# Patient Record
Sex: Female | Born: 1995 | Race: White | Hispanic: No | Marital: Single | Smoking: Never smoker
Health system: Western US, Academic
[De-identification: ages and names within clinical notes are randomized; demographics above are authoritative.]

## PROBLEM LIST (undated history)

## (undated) DIAGNOSIS — G40909 Epilepsy, unspecified, not intractable, without status epilepticus: Secondary | ICD-10-CM

## (undated) DIAGNOSIS — E2839 Other primary ovarian failure: Secondary | ICD-10-CM

## (undated) DIAGNOSIS — D89813 Graft-versus-host disease, unspecified: Secondary | ICD-10-CM

## (undated) DIAGNOSIS — J45909 Unspecified asthma, uncomplicated: Secondary | ICD-10-CM

## (undated) DIAGNOSIS — E46 Unspecified protein-calorie malnutrition: Secondary | ICD-10-CM

## (undated) DIAGNOSIS — R000 Tachycardia, unspecified: Secondary | ICD-10-CM

## (undated) DIAGNOSIS — F4310 Post-traumatic stress disorder, unspecified: Secondary | ICD-10-CM

## (undated) DIAGNOSIS — I10 Essential (primary) hypertension: Secondary | ICD-10-CM

## (undated) DIAGNOSIS — I498 Other specified cardiac arrhythmias: Secondary | ICD-10-CM

## (undated) DIAGNOSIS — M810 Age-related osteoporosis without current pathological fracture: Secondary | ICD-10-CM

## (undated) DIAGNOSIS — D6101 Constitutional (pure) red blood cell aplasia: Secondary | ICD-10-CM

## (undated) DIAGNOSIS — I951 Orthostatic hypotension: Secondary | ICD-10-CM

## (undated) DIAGNOSIS — H902 Conductive hearing loss, unspecified: Secondary | ICD-10-CM

## (undated) DIAGNOSIS — K551 Chronic vascular disorders of intestine: Secondary | ICD-10-CM

## (undated) DIAGNOSIS — G8929 Other chronic pain: Secondary | ICD-10-CM

## (undated) DIAGNOSIS — K3184 Gastroparesis: Secondary | ICD-10-CM

## (undated) DIAGNOSIS — G129 Spinal muscular atrophy, unspecified: Secondary | ICD-10-CM

## (undated) DIAGNOSIS — G90A Postural orthostatic tachycardia syndrome (POTS): Secondary | ICD-10-CM

## (undated) DIAGNOSIS — F445 Conversion disorder with seizures or convulsions: Secondary | ICD-10-CM

## (undated) HISTORY — PX: JEJUNOSTOMY TUBE: PCOTUB00028

## (undated) HISTORY — PX: SP PERC PLACE GASTRIC TUBE: HXRAD333

---
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
## 2017-04-11 ENCOUNTER — Inpatient Hospital Stay
Admission: EM | Admit: 2017-04-11 | Discharge: 2017-04-13 | DRG: 053 | Disposition: A | Discharge status: Other Acute Inpatient Hospital | Attending: Internal Medicine | Admitting: Internal Medicine

## 2017-04-11 ENCOUNTER — Emergency Department

## 2017-04-11 DIAGNOSIS — Z886 Allergy status to analgesic agent status: Secondary | ICD-10-CM

## 2017-04-11 DIAGNOSIS — I10 Essential (primary) hypertension: Secondary | ICD-10-CM

## 2017-04-11 DIAGNOSIS — F4310 Post-traumatic stress disorder, unspecified: Secondary | ICD-10-CM

## 2017-04-11 DIAGNOSIS — Z888 Allergy status to other drugs, medicaments and biological substances status: Secondary | ICD-10-CM

## 2017-04-11 DIAGNOSIS — Z7951 Long term (current) use of inhaled steroids: Secondary | ICD-10-CM

## 2017-04-11 DIAGNOSIS — Z87440 Personal history of urinary (tract) infections: Secondary | ICD-10-CM

## 2017-04-11 DIAGNOSIS — Z880 Allergy status to penicillin: Secondary | ICD-10-CM

## 2017-04-11 DIAGNOSIS — G40901 Epilepsy, unspecified, not intractable, with status epilepticus: Principal | ICD-10-CM

## 2017-04-11 DIAGNOSIS — E2740 Unspecified adrenocortical insufficiency: Secondary | ICD-10-CM

## 2017-04-11 DIAGNOSIS — D89813 Graft-versus-host disease, unspecified: Secondary | ICD-10-CM

## 2017-04-11 DIAGNOSIS — D6101 Constitutional (pure) red blood cell aplasia: Secondary | ICD-10-CM

## 2017-04-11 DIAGNOSIS — Z8489 Family history of other specified conditions: Secondary | ICD-10-CM

## 2017-04-11 DIAGNOSIS — Z681 Body mass index (BMI) 19 or less, adult: Secondary | ICD-10-CM

## 2017-04-11 DIAGNOSIS — G894 Chronic pain syndrome: Secondary | ICD-10-CM

## 2017-04-11 DIAGNOSIS — Z978 Presence of other specified devices: Secondary | ICD-10-CM

## 2017-04-11 DIAGNOSIS — J45909 Unspecified asthma, uncomplicated: Secondary | ICD-10-CM

## 2017-04-11 DIAGNOSIS — K3184 Gastroparesis: Secondary | ICD-10-CM

## 2017-04-11 DIAGNOSIS — N390 Urinary tract infection, site not specified: Secondary | ICD-10-CM

## 2017-04-11 DIAGNOSIS — Z91018 Allergy to other foods: Secondary | ICD-10-CM

## 2017-04-11 DIAGNOSIS — Z881 Allergy status to other antibiotic agents status: Secondary | ICD-10-CM

## 2017-04-11 DIAGNOSIS — M810 Age-related osteoporosis without current pathological fracture: Secondary | ICD-10-CM

## 2017-04-11 DIAGNOSIS — K551 Chronic vascular disorders of intestine: Secondary | ICD-10-CM

## 2017-04-11 DIAGNOSIS — F418 Other specified anxiety disorders: Secondary | ICD-10-CM

## 2017-04-11 DIAGNOSIS — Z79899 Other long term (current) drug therapy: Secondary | ICD-10-CM

## 2017-04-11 DIAGNOSIS — Z91013 Allergy to seafood: Secondary | ICD-10-CM

## 2017-04-11 DIAGNOSIS — Z9481 Bone marrow transplant status: Secondary | ICD-10-CM

## 2017-04-11 DIAGNOSIS — K861 Other chronic pancreatitis: Secondary | ICD-10-CM

## 2017-04-11 DIAGNOSIS — E2839 Other primary ovarian failure: Secondary | ICD-10-CM

## 2017-04-11 DIAGNOSIS — G40909 Epilepsy, unspecified, not intractable, without status epilepticus: Secondary | ICD-10-CM

## 2017-04-11 DIAGNOSIS — H918X3 Other specified hearing loss, bilateral: Secondary | ICD-10-CM

## 2017-04-11 DIAGNOSIS — E43 Unspecified severe protein-calorie malnutrition: Secondary | ICD-10-CM

## 2017-04-11 DIAGNOSIS — K219 Gastro-esophageal reflux disease without esophagitis: Secondary | ICD-10-CM

## 2017-04-11 DIAGNOSIS — I498 Other specified cardiac arrhythmias: Secondary | ICD-10-CM

## 2017-04-11 HISTORY — DX: Constitutional (pure) red blood cell aplasia: D61.01

## 2017-04-11 HISTORY — DX: Epilepsy, unspecified, not intractable, without status epilepticus: G40.909

## 2017-04-11 HISTORY — DX: Essential (primary) hypertension: I10

## 2017-04-11 HISTORY — DX: Unspecified asthma, uncomplicated: J45.909

## 2017-04-11 LAB — UR DRUGS OF ABUSE SCREEN
AMPHETAMINE SCREEN, URINE_MMC: NEGATIVE
BARBITURATES SCREEN, URINE: NEGATIVE
BENZODIAZEPINES SCREEN, URINE: POSITIVE — AB
CANNABINOID, URINE MMC: POSITIVE — AB
COCAINE METABOLITE SCRN, URINE: NEGATIVE
OPIATES SCREEN, URINE: NEGATIVE
PCP, URINE MMC: NEGATIVE
TRICYCLICS, URINE MMC: NEGATIVE

## 2017-04-11 LAB — COMPREHENSIVE METABOLIC PANEL
ALANINE TRANSFERASE (ALT): 19 U/L (ref 4–56)
ALB/GLOB RATIO_MMC: 1.1 (ref 1.0–1.6)
ALBUMIN: 3 g/dL — AB (ref 3.2–4.7)
ALKALINE PHOSPHATASE (ALP): 93 U/L (ref 38–126)
ASPARTATE TRANSAMINASE (AST): 15 U/L (ref 9–44)
BILIRUBIN TOTAL: 0.3 mg/dL (ref 0.1–2.2)
BUN/ Creatinine: 10.1 (ref 7.3–21.7)
CALCIUM: 8.7 mg/dL (ref 8.7–10.2)
CARBON DIOXIDE TOTAL: 24 mmol/L (ref 22–32)
CHLORIDE: 106 mmol/L (ref 99–109)
CREATININE BLOOD: 0.79 mg/dL (ref 0.50–1.30)
E-GFR, NON-AFRICAN AMERICAN: 94 mL/min/{1.73_m2} (ref 60–?)
E-GFR_MMC: 94 mL/min/{1.73_m2} (ref 60–?)
GLOBULIN BLOOD_MMC: 2.7 g/dL (ref 2.2–4.2)
GLUCOSE: 90 mg/dL (ref 70–99)
PROTEIN: 5.7 g/dL — AB (ref 5.9–8.2)
Potassium: 3.6 mmol/L (ref 3.5–5.2)
SODIUM: 138 mmol/L (ref 134–143)
UREA NITROGEN, BLOOD (BUN): 8 mg/dL (ref 6–21)

## 2017-04-11 LAB — URINALYSIS-COMPLETE
BILIRUBIN URINE: NEGATIVE
GLUCOSE URINE: NEGATIVE mg/dL
KETONES_MMC: NEGATIVE
NITRITE URINE: NEGATIVE
OCCULT BLOOD URINE_MMC: NEGATIVE
PH URINE: 7 (ref 5.0–9.0)
PROTEIN URINE: NEGATIVE mg/dL
RBC: 1 /[HPF] (ref <=3)
SPECIFIC GRAVITY URINE MMC: 1.015 (ref 1.002–1.030)
SQUAMOUS EPI: 5
UROBILINOGEN.: NEGATIVE mg/dL
WBC: 14 /HPF — AB (ref <5)

## 2017-04-11 LAB — CBC WITH DIFFERENTIAL
BASOPHILS ABS AUTO: 0 10*3/uL (ref 0.0–0.1)
Basophils % Auto: 0.4 % (ref 0.0–1.0)
EOSINOPHIL % AUTO: 2.8 % (ref 0.0–4.0)
Eosinophils Abs Auto: 0.2 10*3/uL (ref 0.0–0.2)
HEMATOCRIT: 30.3 % — AB (ref 36.0–48.0)
HEMOGLOBIN: 10.6 g/dL — AB (ref 12.0–16.0)
IMMATURE GRANULOCYTES % AUTO: 0.6 % — AB (ref 0.00–0.50)
IMMATURE GRANULOCYTES ABS AUTO: 0.1 10*3/uL — AB (ref 0.0–0.0)
LYMPHOCYTES % AUTO: 24.5 % (ref 5.0–41.0)
Lymphocytes Abs Auto: 2 10*3/uL (ref 1.3–2.9)
MCH: 32.1 pg (ref 27.0–34.0)
MCHC G/DL: 35 g/dL (ref 33.0–37.0)
MCV: 91.8 fL (ref 82.0–97.0)
MONOCYTES % AUTO: 5.3 % (ref 0.0–10.0)
MPV: 9.5 fL (ref 9.4–12.4)
Monocytes Abs Auto: 0.4 10*3/uL (ref 0.3–0.8)
NEUTROPHIL ABS AUTO: 5.39 10*3/uL — AB (ref 2.20–4.80)
NEUTROPHILS % AUTO: 66.4 % (ref 45.0–75.0)
NUCLEATED CELL COUNT: 0 10*3/uL (ref 0.0–0.1)
NUCLEATED RBC/100 WBC: 0 %{WBCs} (ref <=0.0)
PLATELET COUNT: 319 10*3/uL (ref 151–365)
RDW: 13.1 % (ref 11.5–14.5)
RED CELL COUNT: 3.3 10*6/uL — AB (ref 3.80–5.10)
WHITE BLOOD CELL COUNT: 8.1 10*3/uL (ref 4.2–10.8)

## 2017-04-11 LAB — BETA HCG QUANT, PREGNANCY

## 2017-04-11 MED ORDER — ONDANSETRON HCL (PF) 4 MG/2 ML INJECTION SOLUTION
4.0000 mg | Freq: Once | INTRAMUSCULAR | Status: AC
Start: 2017-04-11 — End: 2017-04-11
  Administered 2017-04-11: 4 mg via INTRAVENOUS
  Filled 2017-04-11: qty 2

## 2017-04-11 MED ORDER — NACL 0.9% IV BOLUS - DURATION REQ
1000.0000 mL | Freq: Once | INTRAVENOUS | Status: AC
Start: 2017-04-11 — End: 2017-04-12
  Administered 2017-04-11: 1000 mL via INTRAVENOUS

## 2017-04-11 MED ORDER — LEVETIRACETAM 1,000 MG/100 ML IN SODIUM CHLORIDE(ISO-OSM) IV PIGGYBACK
1000.0000 mg | INJECTION | Freq: Once | INTRAVENOUS | Status: AC
Start: 2017-04-11 — End: 2017-04-11
  Administered 2017-04-11: 1000 mg via INTRAVENOUS
  Filled 2017-04-11: qty 100

## 2017-04-11 MED ORDER — FENTANYL (PF) 50 MCG/ML INJECTION SOLUTION
50.0000 ug | Freq: Once | INTRAMUSCULAR | Status: AC
Start: 2017-04-11 — End: 2017-04-11
  Administered 2017-04-11: 50 ug via INTRAVENOUS
  Filled 2017-04-11: qty 2

## 2017-04-11 MED ORDER — CIPROFLOXACIN 400 MG/200 ML IN 5 % DEXTROSE INTRAVENOUS PIGGYBACK
400.0000 mg | INJECTION | Freq: Once | INTRAVENOUS | Status: AC
Start: 2017-04-11 — End: 2017-04-12
  Administered 2017-04-11: 400 mg via INTRAVENOUS
  Filled 2017-04-11: qty 200

## 2017-04-11 NOTE — ED Initial Note (Signed)
EMERGENCY DEPARTMENT PHYSICIAN NOTE - Chloe Barron       Date of Service:   04/11/2017  7:14 PM Patient's PCP: No primary care provider on file.   Note Started: 04/11/2017 19:22 DOB: [birth year]             Chief Complaint   Patient presents with    Active Seizures           The history provided by the relative.  Interpreter used: No    Chloe Barron is a 21yrold female, with a past medical history significant for seizure disorder, Diamond-Blackfan anemia (s/p BMT), gastroparesis and POTS,  [place]  Hx of daily seizures.  Increasing frequency today[place].  [place]    +medication compliant    7 seizures PTA.    No fever/chills, nausea/vomiting    Patient is actively seizing; hx limited.     A full history, including pertinent past medical and social history was reviewed and updated as necessary.    HISTORY:  There are no active hospital problems to display for this patient.   Allergies   Allergen Reactions    Amoxicillin Hives    Asa [Aspirin] Unknown-Explain in Comments     bleeding    Basiliximab Angioedema    Betadine [Povidone-Iodine] Hives    Docusate Sodium Anaphylaxis    Doxycycline Rash    Garlic Anaphylaxis    Penicillin Hives    Shellfish Containing Products Anaphylaxis    Sweet Potato Hives    Vancomycin Rash      Past Medical History:  04/11/2017: Asthma  04/11/2017: Diamond-Blackfan anemia  04/11/2017: Hypertension  04/11/2017: Seizure disorder Past Surgical History:  05/26/2014: Insertion, tympanostomy tube   Social History    Marital status: SINGLE              Spouse name:                       Years of education:                 Number of children:               Occupational History    None on file    Social History Main Topics    Smoking status: Not on file                          Smokeless status: Not on file                       Alcohol use: Not on file     Drug use: Not  on file     Sexual activity: Not on file          Other Topics            Concern    None on file    Social History Narrative    None on file     No family history on file.             Review of Systems   Unable to perform ROS: Acuity of condition   Neurological: Positive for seizures.       TRIAGE VITAL SIGNS:  Temp: 36.6 C (97.8 F) (04/11/17 1919)  Temp src: Axillary (04/11/17 1919)  Pulse: 63 (04/11/17 1919)  BP: 102/64 (04/11/17 1919)  Resp: 16 (04/11/17 1919)  SpO2: 100 % (04/11/17 1919)  Weight: 45.6 kg (100 lb 8.5 oz) (04/11/17 1919)    Physical Exam   Constitutional:   Small for stated age   HENT:   Head: Normocephalic and atraumatic.   Mouth/Throat: Oropharynx is clear and moist.   Eyes: Pupils are equal, round, and reactive to light. No scleral icterus.   Pupils 6 mm; reactive  Eyes deviated to the right   Cardiovascular: Normal rate, regular rhythm, normal heart sounds and intact distal pulses.    Pulmonary/Chest: Effort normal and breath sounds normal. No respiratory distress.   Abdominal: Bowel sounds are normal. There is no tenderness.   Musculoskeletal: She exhibits no edema or deformity.   Neurological:   Intermittent twitching/spasms  Not alert   Skin: No rash noted. She is not diaphoretic.   cool   Nursing note and vitals reviewed.        INITIAL ASSESSMENT & PLAN, MEDICAL DECISION MAKING, ED COURSE  Chloe Barron is a 41yrfemale who presents with a chief complaint of   Chief Complaint   Patient presents with    Active Seizures    .     Differential includes, but is not limited to: seizure disorder, ICH, fx, electrolyte derangement, UPT, medication failure      The results of the ED evaluation were notable for the following:    Pertinent lab results:   Labs Reviewed   CBC WITH DIFFERENTIAL - Abnormal; Notable for the following:        Result Value    Red Blood Cell Count 3.30 (*)     Hemoglobin 10.6 (*)     Hematocrit 30.3 (*)     Immature Granulocytes % 0.60 (*)     Neutrophil Abs Auto 5.39 (*)      Immature Granulocytes Abs Auto 0.1 (*)     All other components within normal limits   COMPREHENSIVE METABOLIC PANEL - Abnormal; Notable for the following:     Protein 5.7 (*)     Albumin 3.0 (*)     All other components within normal limits    Narrative:     eGFR results >60 is indicative of normal kidney function.    **Stage 3 CKD   GFR 30-59  **Stage 4 CKD   GFR 15-29  **Stage 5 CKD   GFR <15**    The eGFR is an estimate of a patient's kidney function; it is most useful when it is in a relatively steady state and can be used to stage Chronic Kidney Disease. It may not be appropriate to adjust medication dosing. Consult your Pharmacist. The eGFR may not be accurate for patients with extremes in body mass, muscle mass or nutritional status.    eGFR calculation is based on the (MDRD) equation validated only in Caucasians and African-Americans 114754years of age. If patient's race is not indicated and patient is African-American multiply the result by 1.21.    The eGfR may not be accurate for patients with extremes in body mass,muscle mass or nutritional status. The calculation may not be appropriate for medication adjustments in patients with renal impairment (consult Pharmacist).   UR DRUGS OF ABUSE SCREEN - Abnormal; Notable for the following:     Benzodiazepines Screen, Urine POSITIVE (*)     Cannabinoid, Urine POSITIVE (*)     All other components within normal limits   URINALYSIS-COMPLETE - Abnormal; Notable for the following:  Clarity Hazy (*)     Leukocyte Esterase Small (*)     WBC 14 (*)     Bacteria Many (*)     All other components within normal limits   BETA HCG QUANT, PREGNANCY - Normal    Narrative:     Beta hCG interpretation:     0-8 mIU/mL          Not pregnant or <15 days gestation   8-50 mIU/mL         Inconclusive - Repeat test in 3-4 days   >50 mIU/mL          Consistent with pregnancy   50-500 mIU/mL       1-[redacted] Weeks gestation   [id number] mIU/mL     2-[redacted] Weeks gestation   5000-10000 mIU/mL    3-[redacted] Weeks gestation   10000-100000 mIU/mL 2-3 Months gestation   CULTURE URINE, BACTI      Pertinent imaging results (reviewed and interpreted independently by me):   [facility] CT HEAD WITHOUT CONTRAST  [place]    Radiology reads:    [facility] CT HEAD WITHOUT CONTRAST  [place] CT CERVICAL SPINE  Negative     Consults:  Consults Medical Sub-Specialty: Called (04/11/17 2218)  Medications   Ciprofloxacin (CIPRO) 400 mg in D5W 200 mL IVPB (400 mg IV New bag/syringe 04/11/17 2246)   LevETIRAcetam (KEPPRA) 1,000 mg in NaCl (iso-osm) 100 mL IVPB (0 mg IV Stopped 04/11/17 1943)   Ondansetron (ZOFRAN) Injection 4 mg (4 mg IV Given 04/11/17 2019)   Fentanyl (SUBLIMAZE) Injection 50 mcg (50 mcg IV Given 04/11/17 2019)   NaCl 0.9% Bolus 1,000 mL (1,000 mL IV New bag/syringe 04/11/17 2246)         Chart Review: I reviewed the patient's prior medical records. Pertinent information that is relevant to this encounter   - [place] and [employer] records.      Patient Summary: Chloe Barron is a 21yrold female with complex past medical history including seizure disorder, Diamond-Blackfan anemia (s/p BMT), gastroparesis and POTS, [place] given multiple doses of benzodiazepine by EMS.  Arrives actively seizing.  Started on Keppra load which has stopped seizures.  Patient has now returned to baseline although mildly sleepy.  Labs are notable for positive UTI for which family and the patient report recent treatment for UTI with Keflex started on Cipro and urine culture sent.  Patient is in the middle of moving across the country is only in town for a funeral dustlike follow-up.  Additionally her NJ tube which she is totally dependent on for nutrition, fluids, medications; was pulled during the events today.  For this reason I have consulted Dr. SRaquel Jameswho recommends observation for placement of NJ tube tomorrow hospitalist was also  consulted for admission.  [place]  And will be scanned into the chart.     ATTENDING PHYSICIAN CRITICAL CARE ADDENDUM:  I spent a total of 60 minutes of critical care time personally managing (including directly supervising resident provision of critical care) the patient's critical injury or illness.  There is potential end-organ injury including brain injury, cardiovascular collapse, and death.  The patient's condition has required my direct bedside presence for the time noted above.  This time includes management of seizures, discussion with consultants, review of the patient's prior studies and medical record, interpretation of electronic data,  serial bedside clinical reassessments to ensure response to treatment,  discussion with family as patient was unable to make health care decisions, but excludes any procedures as noted above. Signed: Kennyth Lose, MD (Attending Physician)          LAST VITAL SIGNS:  Temp: 36.6 C (97.8 F) (04/11/17 1919)  Temp src: Axillary (04/11/17 1919)  Pulse: 63 (04/11/17 2145)  BP: 99/61 (04/11/17 2145)  Resp: 16 (04/11/17 2145)  SpO2: 100 % (04/11/17 2145)  Weight: 45.6 kg (100 lb 8.5 oz) (04/11/17 1919)      Clinical Impression:   Seizures  UTI  NGJ tube displaced            Disposition: Admit      PATIENT'S GENERAL CONDITION:  Fair: Vital signs are stable and within normal limits. Patient is conscious but may be uncomfortable. Indicators are favorable.    Electronically signed by: Trixie Dredge, MD

## 2017-04-11 NOTE — ED Triage Note (Signed)
[place]. Fiance reports 4 seizures before medics arrived and medics report 7 seizures. Medics gave 5mg  Versed IM and 2.5mg  Versed IV. PT had one seizure upon arrival. Fiance reports that the pt has seizures daily

## 2017-04-11 NOTE — ED Nursing Note (Signed)
Pt seizing 30 seconds

## 2017-04-11 NOTE — ED Nursing Note (Signed)
Warm blanket given

## 2017-04-11 NOTE — ED Nursing Note (Signed)
Straight cath

## 2017-04-11 NOTE — ED Nursing Note (Signed)
Lab at bedside

## 2017-04-11 NOTE — ED Nursing Note (Signed)
Report to Taylor, RN

## 2017-04-11 NOTE — ED Nursing Note (Signed)
Pt alert, talking, able to make needs known.  Questions answered and plan of care explained

## 2017-04-11 NOTE — ED Nursing Note (Signed)
CT called for transport

## 2017-04-11 NOTE — ED Nursing Note (Signed)
Report to Greg,RN

## 2017-04-12 ENCOUNTER — Observation Stay: Admitting: Specialist

## 2017-04-12 ENCOUNTER — Other Ambulatory Visit: Payer: Self-pay

## 2017-04-12 ENCOUNTER — Encounter
Admission: EM | Disposition: A | Discharge status: Other Acute Inpatient Hospital | Payer: Self-pay | Source: Emergency Department | Attending: Internal Medicine

## 2017-04-12 DIAGNOSIS — E43 Unspecified severe protein-calorie malnutrition: Secondary | ICD-10-CM

## 2017-04-12 LAB — CBC WITH DIFFERENTIAL
BASOPHILS % AUTO: 0.5 % (ref 0.0–1.0)
BASOPHILS ABS AUTO: 0 10*3/uL (ref 0.0–0.1)
EOSINOPHIL % AUTO: 4.6 % — AB (ref 0.0–4.0)
EOSINOPHIL ABS AUTO: 0.3 10*3/uL — AB (ref 0.0–0.2)
HEMATOCRIT: 33.1 % — AB (ref 36.0–48.0)
HEMOGLOBIN: 11 g/dL — AB (ref 12.0–16.0)
IMMATURE GRANULOCYTES % AUTO: 0.7 % — AB (ref 0.00–0.50)
IMMATURE GRANULOCYTES ABS AUTO: 0 10*3/uL (ref 0.0–0.0)
LYMPHOCYTE ABS AUTO: 1.8 10*3/uL (ref 1.3–2.9)
LYMPHOCYTES % AUTO: 32 % (ref 5.0–41.0)
MCH: 31.2 pg (ref 27.0–34.0)
MCHC G/DL: 33.2 g/dL (ref 33.0–37.0)
MCV: 93.8 fL (ref 82.0–97.0)
MONOCYTES ABS AUTO: 0.3 10*3/uL (ref 0.3–0.8)
MPV: 9.5 fL (ref 9.4–12.4)
Monocytes % Auto: 5.9 % (ref 0.0–10.0)
NEUTROPHIL ABS AUTO: 3.08 10*3/uL (ref 2.20–4.80)
NEUTROPHILS % AUTO: 56.3 % (ref 45.0–75.0)
NUCLEATED CELL COUNT: 0 10*3/uL (ref 0.0–0.1)
Nucleated RBC/100 WBC: 0 %{WBCs} (ref <=0.0)
PLATELET COUNT: 300 10*3/uL (ref 151–365)
RDW: 13.2 % (ref 11.5–14.5)
RED CELL COUNT: 3.53 10*6/uL — AB (ref 3.80–5.10)
WHITE BLOOD CELL COUNT: 5.5 10*3/uL (ref 4.2–10.8)

## 2017-04-12 LAB — BASIC METABOLIC PANEL
BUN/CREATININE_MMC: 8.9 (ref 7.3–21.7)
CALCIUM: 8.3 mg/dL — AB (ref 8.7–10.2)
CARBON DIOXIDE TOTAL: 24 mmol/L (ref 22–32)
CHLORIDE: 109 mmol/L (ref 99–109)
CREATININE BLOOD: 0.56 mg/dL (ref 0.50–1.30)
E-GFR, Non-African American: 139 mL/min/{1.73_m2} (ref >60)
E-GFR_MMC: 139 mL/min/{1.73_m2} (ref 60–?)
GLUCOSE: 90 mg/dL (ref 70–99)
POTASSIUM: 3.2 mmol/L — AB (ref 3.5–5.2)
Sodium: 139 mmol/L (ref 134–143)
UREA NITROGEN, BLOOD (BUN): 5 mg/dL — AB (ref 6–21)

## 2017-04-12 LAB — MMC PROTHROMBIN TIME/INR
INR: 1.06 (ref 0.88–1.12)
Prothrombin Time: 12.3 s (ref 10.2–13.1)

## 2017-04-12 SURGERY — INSERTION, FEEDING TUBE, JEJUNOSTOMY, LAPAROSCOPIC
Anesthesia: General | Site: Abdomen | Wound class: Clean

## 2017-04-12 MED ORDER — DIAZEPAM 5 MG TABLET
5.0000 mg | ORAL_TABLET | Freq: Four times a day (QID) | ORAL | Status: DC | PRN
Start: 2017-04-12 — End: 2017-04-13
  Filled 2017-04-12: qty 1

## 2017-04-12 MED ORDER — ALBUTEROL SULFATE CONCENTRATE 2.5 MG/0.5 ML SOLUTION FOR NEBULIZATION
2.5000 mg | INHALATION_SOLUTION | RESPIRATORY_TRACT | Status: DC | PRN
Start: 2017-04-12 — End: 2017-04-12

## 2017-04-12 MED ORDER — NUTRITIONAL SUPPLEMENTS 0.07 GRAM-1.5 KCAL/ML LIQUID FOR TUBE FEED
1.0000 | Freq: Three times a day (TID) | GASTROSTOMY | Status: DC
Start: 2017-04-12 — End: 2017-04-13

## 2017-04-12 MED ORDER — MORPHINE 2 MG/ML INTRAVENOUS CARTRIDGE
CARTRIDGE | INTRAVENOUS | Status: AC
Start: 2017-04-12 — End: 2017-04-12
  Filled 2017-04-12: qty 1

## 2017-04-12 MED ORDER — HEPARIN, PORCINE (PF) 5,000 UNIT/0.5 ML INJECTION SYRINGE
5000.0000 [IU] | INJECTION | Freq: Three times a day (TID) | INTRAMUSCULAR | Status: DC
Start: 2017-04-12 — End: 2017-04-12

## 2017-04-12 MED ORDER — ACETAMINOPHEN 325 MG TABLET
650.0000 mg | ORAL_TABLET | Freq: Four times a day (QID) | ORAL | Status: DC | PRN
Start: 2017-04-12 — End: 2017-04-13

## 2017-04-12 MED ORDER — PROMETHAZINE 25 MG/ML INJECTION SOLUTION
6.2500 mg | INTRAMUSCULAR | Status: DC | PRN
Start: 2017-04-12 — End: 2017-04-13
  Administered 2017-04-12 (×4): 6.25 mg via INTRAVENOUS
  Filled 2017-04-12 (×5): qty 1

## 2017-04-12 MED ORDER — FOOD SUPPLEMENT, LACTOSE-REDUCED 0.06 GRAM-1.5 KCAL/ML ORAL LIQUID
1.0000 | Freq: Three times a day (TID) | ORAL | Status: DC
Start: 2017-04-12 — End: 2017-04-13

## 2017-04-12 MED ORDER — BISACODYL 10 MG RECTAL SUPPOSITORY
10.0000 mg | RECTAL | Status: DC | PRN
Start: 2017-04-12 — End: 2017-04-13

## 2017-04-12 MED ORDER — BUDESONIDE-FORMOTEROL HFA 160 MCG-4.5 MCG/ACTUATION AEROSOL INHALER
2.0000 | INHALATION_SPRAY | Freq: Two times a day (BID) | RESPIRATORY_TRACT | Status: DC
Start: 2017-04-12 — End: 2017-04-13
  Administered 2017-04-12 – 2017-04-13 (×3): 2 via RESPIRATORY_TRACT
  Filled 2017-04-12: qty 10.2

## 2017-04-12 MED ORDER — PROMETHAZINE 25 MG TABLET
25.0000 mg | ORAL_TABLET | Freq: Three times a day (TID) | ORAL | Status: DC | PRN
Start: 2017-04-12 — End: 2017-04-13
  Filled 2017-04-12: qty 1

## 2017-04-12 MED ORDER — REMOVE SCOPOLAMINE PATCH
1.0000 | Status: DC
Start: 2017-04-15 — End: 2017-04-13

## 2017-04-12 MED ORDER — MELATONIN 3 MG TABLET
9.0000 mg | ORAL_TABLET | Freq: Every day | ORAL | Status: DC | PRN
Start: 2017-04-12 — End: 2017-04-13
  Filled 2017-04-12: qty 3

## 2017-04-12 MED ORDER — HYDROCORTISONE SOD SUCCINATE (PF) 100 MG/2 ML SOLUTION FOR INJECTION
INTRAMUSCULAR | Status: DC | PRN
Start: 2017-04-12 — End: 2017-04-12
  Administered 2017-04-12: 100 mg via INTRAVENOUS

## 2017-04-12 MED ORDER — CIPROFLOXACIN 400 MG/200 ML IN 5 % DEXTROSE INTRAVENOUS PIGGYBACK
400.0000 mg | INJECTION | Freq: Two times a day (BID) | INTRAVENOUS | Status: DC
Start: 2017-04-12 — End: 2017-04-13
  Administered 2017-04-12 – 2017-04-13 (×3): 400 mg via INTRAVENOUS
  Filled 2017-04-12 (×5): qty 200

## 2017-04-12 MED ORDER — NALOXONE 0.4 MG/ML INJECTION SOLUTION
0.2000 mg | INTRAMUSCULAR | Status: DC | PRN
Start: 2017-04-12 — End: 2017-04-13

## 2017-04-12 MED ORDER — FENTANYL (PF) 50 MCG/ML INJECTION SOLUTION
25.0000 ug | INTRAMUSCULAR | Status: DC | PRN
Start: 2017-04-12 — End: 2017-04-12
  Administered 2017-04-12: 25 ug via INTRAVENOUS
  Filled 2017-04-12: qty 2

## 2017-04-12 MED ORDER — MAGNESIUM HYDROXIDE 400 MG/5 ML ORAL SUSPENSION
30.0000 mL | ORAL | Status: DC | PRN
Start: 2017-04-12 — End: 2017-04-13

## 2017-04-12 MED ORDER — DEXTROSE 5 % AND 0.45 % SODIUM CHLORIDE INTRAVENOUS SOLUTION
INTRAVENOUS | Status: AC
Start: 2017-04-12 — End: 2017-04-13
  Administered 2017-04-12 (×3): via INTRAVENOUS

## 2017-04-12 MED ORDER — LIDOCAINE HCL 10 MG/ML (1 %) INJECTION SOLUTION
0.2000 mL | INTRAMUSCULAR | Status: DC | PRN
Start: 2017-04-12 — End: 2017-04-13

## 2017-04-12 MED ORDER — HYDROCORTISONE SOD SUCCINATE (PF) 100 MG/2 ML SOLUTION FOR INJECTION
100.0000 mg | Freq: Three times a day (TID) | INTRAMUSCULAR | Status: AC
Start: 2017-04-12 — End: 2017-04-13
  Administered 2017-04-12 – 2017-04-13 (×3): 100 mg via INTRAVENOUS
  Filled 2017-04-12 (×3): qty 2

## 2017-04-12 MED ORDER — INTRAOP ROCURONIUM 10 MG/ML INJ 5 ML VIAL
Status: DC | PRN
Start: 2017-04-12 — End: 2017-04-12
  Administered 2017-04-12: 40 mg via INTRAVENOUS

## 2017-04-12 MED ORDER — CALCIUM 200 MG (AS CALCIUM CARBONATE 500 MG) CHEWABLE TABLET
750.0000 mg | CHEWABLE_TABLET | Freq: Every day | ORAL | Status: DC
Start: 2017-04-12 — End: 2017-04-13
  Filled 2017-04-12: qty 2

## 2017-04-12 MED ORDER — METOCLOPRAMIDE 10 MG TABLET
10.0000 mg | ORAL_TABLET | Freq: Four times a day (QID) | ORAL | Status: DC
Start: 2017-04-12 — End: 2017-04-13
  Administered 2017-04-13: 10 mg via ORAL
  Filled 2017-04-12 (×2): qty 1

## 2017-04-12 MED ORDER — MONTELUKAST 10 MG TABLET
10.0000 mg | ORAL_TABLET | Freq: Every day | ORAL | Status: DC
Start: 2017-04-12 — End: 2017-04-13

## 2017-04-12 MED ORDER — SODIUM CHLORIDE 0.9 % (FLUSH) INJECTION SYRINGE
2.5000 mL | INJECTION | Freq: Three times a day (TID) | INTRAMUSCULAR | Status: DC
Start: 2017-04-12 — End: 2017-04-13
  Administered 2017-04-13: 2.5 mL via INTRAVENOUS

## 2017-04-12 MED ORDER — INTRAOP BUPIVACAINE 0.25%-EPI 1:200,000 PF INJ 10 ML VIAL
Status: DC | PRN
Start: 2017-04-12 — End: 2017-04-12
  Administered 2017-04-12: 6 mL

## 2017-04-12 MED ORDER — MORPHINE 4 MG/ML INTRAVENOUS CARTRIDGE
3.0000 mg | CARTRIDGE | INTRAVENOUS | Status: DC | PRN
Start: 2017-04-12 — End: 2017-04-13
  Administered 2017-04-12 – 2017-04-13 (×4): 3 mg via INTRAVENOUS
  Filled 2017-04-12 (×5): qty 1

## 2017-04-12 MED ORDER — INTRAOP STERILE WATER 1000 ML IRRIGATION BOTTLE
Status: DC | PRN
Start: 2017-04-12 — End: 2017-04-12
  Administered 2017-04-12: 1000 mL

## 2017-04-12 MED ORDER — DRONABINOL 5 MG CAPSULE
10.0000 mg | ORAL_CAPSULE | Freq: Three times a day (TID) | ORAL | Status: DC
Start: 2017-04-12 — End: 2017-04-13
  Administered 2017-04-13: 10 mg via ORAL
  Filled 2017-04-12 (×2): qty 2

## 2017-04-12 MED ORDER — LACTATED RINGERS INTRAVENOUS SOLUTION
INTRAVENOUS | Status: DC
Start: 2017-04-12 — End: 2017-04-12

## 2017-04-12 MED ORDER — SUMATRIPTAN 50 MG TABLET
50.0000 mg | ORAL_TABLET | ORAL | Status: DC | PRN
Start: 2017-04-12 — End: 2017-04-13
  Filled 2017-04-12: qty 1

## 2017-04-12 MED ORDER — LORAZEPAM 1 MG TABLET
2.0000 mg | ORAL_TABLET | Freq: Three times a day (TID) | ORAL | Status: DC | PRN
Start: 2017-04-12 — End: 2017-04-13
  Administered 2017-04-12: 2 mg via ORAL
  Filled 2017-04-12: qty 2

## 2017-04-12 MED ORDER — MORPHINE 10 MG/ML INTRAVENOUS CARTRIDGE
2.0000 mg | CARTRIDGE | INTRAVENOUS | Status: DC | PRN
Start: 2017-04-12 — End: 2017-04-12
  Administered 2017-04-12 (×5): 2 mg via INTRAVENOUS
  Filled 2017-04-12: qty 1

## 2017-04-12 MED ORDER — LORAZEPAM 2 MG/ML INJECTION SOLUTION
1.0000 mg | INTRAMUSCULAR | Status: DC | PRN
Start: 2017-04-12 — End: 2017-04-13
  Administered 2017-04-13 (×2): 1 mg via INTRAVENOUS
  Filled 2017-04-12 (×5): qty 1

## 2017-04-12 MED ORDER — SODIUM CHLORIDE 1 GRAM TABLET
2.0000 g | ORAL_TABLET | Freq: Every day | ORAL | Status: DC
Start: 2017-04-12 — End: 2017-04-13
  Filled 2017-04-12 (×3): qty 2

## 2017-04-12 MED ORDER — PROMETHAZINE 25 MG/ML INJECTION SOLUTION
6.2500 mg | INTRAMUSCULAR | Status: DC | PRN
Start: 2017-04-12 — End: 2017-04-12

## 2017-04-12 MED ORDER — INTRAOP PROPOFOL INJ 20 ML VIAL (BOLUS+INFUSION)
Status: DC | PRN
Start: 2017-04-12 — End: 2017-04-12
  Administered 2017-04-12: 150 mg via INTRAVENOUS

## 2017-04-12 MED ORDER — NALOXONE 0.4 MG/ML INJECTION SOLUTION
0.0800 mg | INTRAMUSCULAR | Status: DC | PRN
Start: 2017-04-12 — End: 2017-04-13

## 2017-04-12 MED ORDER — INTRAOP NEOSTIGMINE 1 MG/ML INJ 10 ML VIAL
Status: DC | PRN
Start: 2017-04-12 — End: 2017-04-12
  Administered 2017-04-12: 5 mg via INTRAVENOUS

## 2017-04-12 MED ORDER — LACTOBACILLUS RHAMNOSUS GG 15 BILLION CELL SPRINKLE CAPSULE
1.0000 | ORAL_CAPSULE | Freq: Two times a day (BID) | ORAL | Status: DC
Start: 2017-04-12 — End: 2017-04-13
  Administered 2017-04-12: 07:00:00 1 via ORAL
  Filled 2017-04-12: qty 1

## 2017-04-12 MED ORDER — MORPHINE 2 MG/ML INTRAVENOUS CARTRIDGE
1.0000 mg | CARTRIDGE | INTRAVENOUS | Status: DC | PRN
Start: 2017-04-12 — End: 2017-04-13
  Administered 2017-04-13: 1 mg via INTRAVENOUS
  Filled 2017-04-12: qty 1

## 2017-04-12 MED ORDER — FAMOTIDINE 20 MG TABLET
20.0000 mg | ORAL_TABLET | Freq: Two times a day (BID) | ORAL | Status: DC
Start: 2017-04-12 — End: 2017-04-13
  Administered 2017-04-13: 20 mg via ORAL
  Filled 2017-04-12 (×2): qty 1

## 2017-04-12 MED ORDER — LEVETIRACETAM 500 MG TABLET
1500.0000 mg | ORAL_TABLET | Freq: Two times a day (BID) | ORAL | Status: DC
Start: 2017-04-12 — End: 2017-04-13
  Filled 2017-04-12 (×2): qty 3

## 2017-04-12 MED ORDER — ALBUTEROL SULFATE HFA 90 MCG/ACTUATION AEROSOL INHALER
1.0000 | INHALATION_SPRAY | RESPIRATORY_TRACT | Status: DC | PRN
Start: 2017-04-12 — End: 2017-04-13

## 2017-04-12 MED ORDER — INTRAOP FENTANYL 50 MCG/ML INJ 2 ML AMP
Status: DC | PRN
Start: 2017-04-12 — End: 2017-04-12
  Administered 2017-04-12 (×2): 50 ug via INTRAVENOUS

## 2017-04-12 MED ORDER — INTRAOP GLYCOPYRROLATE 0.2 MG/ML 2 ML VIAL
Status: DC | PRN
Start: 2017-04-12 — End: 2017-04-12
  Administered 2017-04-12: .6 mg via INTRAVENOUS

## 2017-04-12 MED ORDER — MIDAZOLAM 1 MG/ML INJECTION SOLUTION
INTRAMUSCULAR | Status: AC
Start: 2017-04-12 — End: 2017-04-12
  Filled 2017-04-12: qty 2

## 2017-04-12 MED ORDER — ALBUTEROL SULFATE CONCENTRATE 2.5 MG/0.5 ML SOLUTION FOR NEBULIZATION
2.5000 mg | INHALATION_SOLUTION | Freq: Four times a day (QID) | RESPIRATORY_TRACT | Status: DC | PRN
Start: 2017-04-12 — End: 2017-04-13
  Filled 2017-04-12: qty 1

## 2017-04-12 MED ORDER — SUMATRIPTAN 25 MG TABLET
25.0000 mg | ORAL_TABLET | ORAL | Status: DC | PRN
Start: 2017-04-12 — End: 2017-04-12

## 2017-04-12 MED ORDER — ESTRADIOL 0.1 MG/24 HR WEEKLY TRANSDERMAL PATCH
1.0000 | MEDICATED_PATCH | TRANSDERMAL | Status: DC
Start: 2017-04-15 — End: 2017-04-13

## 2017-04-12 MED ORDER — ONDANSETRON HCL (PF) 4 MG/2 ML INJECTION SOLUTION
INTRAMUSCULAR | Status: DC | PRN
Start: 2017-04-12 — End: 2017-04-12
  Administered 2017-04-12: 4 mg via INTRAVENOUS

## 2017-04-12 MED ORDER — SODIUM CHLORIDE 0.9 % (FLUSH) INJECTION SYRINGE
2.5000 mL | INJECTION | INTRAMUSCULAR | Status: DC | PRN
Start: 2017-04-12 — End: 2017-04-13

## 2017-04-12 MED ORDER — REMOVE ESTRADIOL PATCH
Status: DC
Start: 2017-04-22 — End: 2017-04-13

## 2017-04-12 MED ORDER — CETIRIZINE 10 MG TABLET
10.0000 mg | ORAL_TABLET | Freq: Every day | ORAL | Status: DC
Start: 2017-04-12 — End: 2017-04-13

## 2017-04-12 MED ORDER — TRAMADOL 50 MG TABLET
50.0000 mg | ORAL_TABLET | Freq: Three times a day (TID) | ORAL | Status: DC | PRN
Start: 2017-04-12 — End: 2017-04-13
  Administered 2017-04-12: 50 mg via ORAL
  Filled 2017-04-12: qty 1

## 2017-04-12 MED ORDER — LIDOCAINE HCL 10 MG/ML (1 %) INJECTION SOLUTION
INTRAMUSCULAR | Status: DC | PRN
Start: 2017-04-12 — End: 2017-04-12
  Administered 2017-04-12: 30 mg via INTRAVENOUS

## 2017-04-12 MED ORDER — FLUOXETINE 20 MG CAPSULE
60.0000 mg | ORAL_CAPSULE | Freq: Every day | ORAL | Status: DC
Start: 2017-04-12 — End: 2017-04-13
  Administered 2017-04-13: 60 mg via ORAL
  Filled 2017-04-12: qty 3

## 2017-04-12 MED ORDER — PANTOPRAZOLE 20 MG TABLET,DELAYED RELEASE
20.0000 mg | DELAYED_RELEASE_TABLET | Freq: Every day | ORAL | Status: DC
Start: 2017-04-12 — End: 2017-04-13
  Administered 2017-04-12: 20 mg via ORAL
  Filled 2017-04-12: qty 1

## 2017-04-12 MED ORDER — FENTANYL (PF) 50 MCG/ML INJECTION SOLUTION
INTRAMUSCULAR | Status: AC
Start: 2017-04-12 — End: 2017-04-12
  Filled 2017-04-12: qty 2

## 2017-04-12 MED ORDER — POLYETHYLENE GLYCOL 3350 17 GRAM ORAL POWDER PACKET
17.0000 g | Freq: Two times a day (BID) | ORAL | Status: DC
Start: 2017-04-12 — End: 2017-04-13
  Filled 2017-04-12: qty 1

## 2017-04-12 MED ORDER — INTRAOP MIDAZOLAM (PF) 1 MG/ML INJ 2 ML VIAL
INTRAMUSCULAR | Status: DC | PRN
Start: 2017-04-12 — End: 2017-04-12
  Administered 2017-04-12: 2 mg via INTRAVENOUS

## 2017-04-12 MED ORDER — OXYCODONE 5 MG TABLET
10.0000 mg | ORAL_TABLET | Freq: Four times a day (QID) | ORAL | Status: DC | PRN
Start: 2017-04-12 — End: 2017-04-13
  Administered 2017-04-13: 10 mg via ORAL
  Filled 2017-04-12 (×2): qty 2

## 2017-04-12 MED ORDER — INTRAOP DEXAMETHASONE 4 MG/ML INJ 1 ML VIAL
Status: DC | PRN
Start: 2017-04-12 — End: 2017-04-12
  Administered 2017-04-12: 10 mg via INTRAVENOUS

## 2017-04-12 MED ORDER — PREGABALIN 75 MG CAPSULE
75.0000 mg | ORAL_CAPSULE | Freq: Three times a day (TID) | ORAL | Status: DC
Start: 2017-04-12 — End: 2017-04-13
  Filled 2017-04-12: qty 1

## 2017-04-12 MED ORDER — IBUPROFEN 100 MG/5 ML ORAL SUSPENSION
600.0000 mg | Freq: Four times a day (QID) | ORAL | Status: DC | PRN
Start: 2017-04-12 — End: 2017-04-13
  Filled 2017-04-12: qty 30

## 2017-04-12 MED ORDER — INSULIN U-100 REGULAR HUMAN 100 UNIT/ML INJECTION SOLUTION
3.0000 [IU] | Freq: Once | INTRAMUSCULAR | Status: AC
Start: 2017-04-12 — End: 2017-04-12
  Administered 2017-04-12: 3 [IU] via SUBCUTANEOUS

## 2017-04-12 MED ORDER — HEPARIN, PORCINE (PF) 5,000 UNIT/0.5 ML INJECTION SYRINGE
5000.0000 [IU] | INJECTION | Freq: Three times a day (TID) | INTRAMUSCULAR | Status: DC
Start: 2017-04-13 — End: 2017-04-13
  Administered 2017-04-13 (×2): 5000 [IU] via SUBCUTANEOUS
  Filled 2017-04-12 (×2): qty 1

## 2017-04-12 MED ORDER — SODIUM CHLORIDE 0.9 % FOR NEBULIZATION
INHALATION_SOLUTION | RESPIRATORY_TRACT | Status: AC
Start: 2017-04-12 — End: 2017-04-12
  Filled 2017-04-12: qty 3

## 2017-04-12 MED ORDER — LEVETIRACETAM 500 MG/5 ML INTRAVENOUS SOLUTION
1500.0000 mg | Freq: Two times a day (BID) | INTRAVENOUS | Status: DC
Start: 2017-04-12 — End: 2017-04-13
  Administered 2017-04-12 – 2017-04-13 (×3): 1500 mg via INTRAVENOUS
  Filled 2017-04-12 (×5): qty 15

## 2017-04-12 MED ORDER — INTRAOP NACL 0.9% 500 ML IRRIGATION BOTTLE
Status: DC | PRN
Start: 2017-04-12 — End: 2017-04-12
  Administered 2017-04-12: 500 mL

## 2017-04-12 MED ORDER — LACTATED RINGERS INTRAVENOUS SOLUTION
INTRAVENOUS | Status: DC
Start: 2017-04-12 — End: 2017-04-12
  Administered 2017-04-12: 16:00:00 via INTRAVENOUS

## 2017-04-12 MED ORDER — SCOPOLAMINE 1 MG OVER 3 DAYS TRANSDERMAL PATCH
1.0000 | MEDICATED_PATCH | TRANSDERMAL | Status: DC
Start: 2017-04-12 — End: 2017-04-13
  Administered 2017-04-12: 1 via TRANSDERMAL
  Filled 2017-04-12: qty 1

## 2017-04-12 MED ORDER — LACTATED RINGERS IV INFUSION
INTRAVENOUS | Status: DC | PRN
Start: 2017-04-12 — End: 2017-04-12
  Administered 2017-04-12: 14:00:00 via INTRAVENOUS

## 2017-04-12 MED ORDER — ONDANSETRON HCL (PF) 4 MG/2 ML INJECTION SOLUTION
4.0000 mg | INTRAMUSCULAR | Status: DC | PRN
Start: 2017-04-12 — End: 2017-04-12

## 2017-04-12 MED ORDER — MEDROXYPROGESTERONE 2.5 MG TABLET
10.0000 mg | ORAL_TABLET | Freq: Every day | ORAL | Status: DC
Start: 2017-04-12 — End: 2017-04-13
  Filled 2017-04-12 (×2): qty 4
  Filled 2017-04-12: qty 1
  Filled 2017-04-12 (×2): qty 4

## 2017-04-12 MED ORDER — ONDANSETRON HCL (PF) 4 MG/2 ML INJECTION SOLUTION
4.0000 mg | Freq: Three times a day (TID) | INTRAMUSCULAR | Status: DC | PRN
Start: 2017-04-12 — End: 2017-04-13
  Administered 2017-04-13: 4 mg via INTRAVENOUS
  Filled 2017-04-12: qty 2

## 2017-04-12 SURGICAL SUPPLY — 18 items
"DRESSING 4"" X 4"" 12-PLY STRL GAUZE" (Dressing) ×1
BANDAID ABN3100A 1" X 3 FLEXIBLE L/F (Dressing) ×3
CLOSURE C1544 STERI-STRIP BENZOIN TINCT (Dressing) ×1
GLOVE 2D72PT75X 7.5 PROTEXIS STERILE P/F (Glove) ×1
GOWN 9545 X-LARGE STERILE SURG LEVEL 3 (Gown) ×1
GOWN 9575 XX-LARGE LEVEL 3 (Gown) ×1
INTRODUCER KIT LAPAROSCOPIC 24FR.FEEDING (Sheath) ×1
MARKER CL24120 SKIN W/RULR (Other) ×1
PACK LAP CHOLE (Pack) ×1
SCOPE WARMER SYST DISP SEAL (Other) ×1
SLEEVE MED VP501MG CALF  SCD COMPRESS (Other) ×1
SUTURE MONOSOF 2-0 C-15 (Suture) ×1
SUTURE POLYSORB 4-0 U/D C-13 SL691 (Suture) ×1
TOWEL ORT06BX 6 PK STRL BLU O.R. X-RAY (Other) ×1
TROCAR 5 X 100MM FIOS ZTHREAD (Sheath) ×1
TROCAR 5 X 100MM SHIELDED BLADED CTB03 (Sheath) ×1
TUBE GASTROSTOMY 0110-20 20FR (Misc-GI) ×1 IMPLANT
TUBING  HIGH FLOW INSUFFILATOR (Tubing) ×1

## 2017-04-12 NOTE — Allied Health Progress (Signed)
CASE MANAGEMENT INTIAL PATIENT SCREEN     Patient: Chloe Barron                                    MRN: [id number]  Date of Birth: 09-25-1995 (27yr)                       Gender: female  PCP: No Pcp Per Patient                              Date of Initial Screen:  04/12/2017 @ 13:04    Type of Contact: Face to Face    Date of Admission: 04/11/2017    Recent ED or Admission Dates:  In [place] and [place]          Insurance Status: Payor: MEDICAID / Plan: OTHER STATE MEDICAID - MISC / Product Type: *No Product type* /     Chief Complaint:  Active Seizures      Admitting Diagnosis:   Seizure disorder [G40.909]  Severe protein-calorie malnutrition [E43]     Co-Morbidities relevant to current admission: POTS, Hx of Seizure activity    Consults:   GASTROENTEROLOGY CONSULT  SOCIAL SERVICES/ SOCIAL WORKER CONSULT    Procedures: Procedure(s):  PLACEMENT, FEEDING TUBE, JEJUNOSTOMY, LAPAROSCOPIC    Social Determinants: lives with family and friend    Home Care active: no       Agency and services:    Readmission Risk Score: Risk of Admission or ED Visit: 29    1 - 19 Green   21 - 39 Yellow   > 40 Red      Case Management Services:    Complete Comprehensive Assessment    yes  (.cmcomp)        COMPREHENSIVE CASE MANANAGEMENT ASSESSMENT      Designated Primary Contact:        Phone numbers in Demographics: yes    Dwelling: Home with Family    Person Living with Patient: Lives with Family  Pets:    SNF stay in the last 60 days: no          If SNF: Name & bed hold?:  (Medi-Cal - auto 7 day bed hold, Medicare, hold must be offered but can be declined)           If ALU / B&C: Name and Phone:    Overall Pre-admission Functional Level: Moderate Assistance    Physical Impairments: POTS and Hx of Seizures     Requires assist with: Ambulation, Transfer, Housekeeping, Shopping and Driving    Assistance provided by: Parnter/Significant Other    Assistive Device: Metallurgist: None       DME provider:    Other  assistive Equipment in Home: Wheelchair    Social Barriers: patient traveling in area for funeral     Conservatorship: no        Name and # of Guardian:    Pre-admission Services: na    Home Care active: no       Agency and services:    Wound Care Clinic active: no    Enteral/TPN active: no        Provider:    Pre-hospital Lines / Catheters: PEG        [employer] Support Services: na    Discharge Plan: Home  with Family Assistance      Barriers to Achieve D/C Plan: Medical Issues and Physicial Mobility    Isolation Needs: no    Additional Notes:     During patient interview patient actively began to seize. Seizure noted to have lasted approximately 3 minutes. Med RN arrived to administer medication. Prior to active seizure patient noted that she walked with crutches and or used a wheelchair to get around. She is currently here with family for a funeral and anticipate returning home in the next few days. DME needs maybe crutches as they did not bring a pair from home. Otherwise no other DME needs noted. [place] no home care or SNF needs.     Gerre Pebblesandi M Arias-[place]ado, RN  04/12/2017 @ 13:04

## 2017-04-12 NOTE — ED Nursing Note (Signed)
Pt asleep. Breathing regular. Awakened to verbal stimulation. No signs of distress.

## 2017-04-12 NOTE — Plan of Care (Signed)
Problem: Patient Care Overview (Adult)  Goal: Plan of Care Review   04/12/17 0418   OTHER   Plan Of Care Reviewed With patient     Goal: Individualization and Mutuality   04/12/17 0651   Mutuality/Individual Preferences   What Anxieties, Fears or Concerns Do You Have About Your Health or Care? none stated   What Questions Do You Have About Your Health or Care? none stated    Individualization   Patient Specific Goals Get well and go home    Patient Specific Interventions pain medication and seizure precautions      Goal: Discharge Needs Assessment  Outcome: Ongoing (interventions implemented as appropriate)   04/12/17 0657   Current Health   Outpatient/Agency/Support Group Needs none   Anticipated Changes Related to Illness none   Activity/Self Care Review of Systems   Equipment Currently Used at Home none   Living Environment   Transportation Available none       Problem: Sleep Pattern Disturbance (Adult)  Goal: Identify Related Risk Factors and Signs and Symptoms  Related risk factors and signs and symptoms are identified upon initiation of Human Response Clinical Practice Guideline (CPG)   Outcome: Ongoing (interventions implemented as appropriate)   04/12/17 0657   Sleep Pattern Disturbance   Sleep Pattern Disturbance: Related Risk Factors hospital routine/care     Goal: Adequate Sleep/Rest  Patient will demonstrate the desired outcomes by discharge/transition of care.   Outcome: Ongoing (interventions implemented as appropriate)   04/12/17 0657   Sleep Pattern Disturbance (Adult)   Adequate Sleep/Rest making progress toward outcome       Problem: Pain, Acute (Adult)  Goal: Identify Related Risk Factors and Signs and Symptoms  Related risk factors and signs and symptoms are identified upon initiation of Human Response Clinical Practice Guideline (CPG)   Outcome: Ongoing (interventions implemented as appropriate)   04/12/17 0657   Pain, Acute   Related Risk Factors (Acute Pain) (Fall at home )   Signs and  Symptoms (Acute Pain) verbalization of pain descriptors     Goal: Acceptable Pain Control/Comfort Level  Patient will demonstrate the desired outcomes by discharge/transition of care.   Outcome: Ongoing (interventions implemented as appropriate)   04/12/17 0657   Pain, Acute (Adult)   Acceptable Pain Control/Comfort Level making progress toward outcome       Problem: Nutrition, Imbalanced: Inadequate Oral Intake (Adult)  Goal: Identify Related Risk Factors and Signs and Symptoms  Related risk factors and signs and symptoms are identified upon initiation of Human Response Clinical Practice Guideline (CPG)   Outcome: Ongoing (interventions implemented as appropriate)   04/12/17 0657   Nutrition, Imbalanced: Inadequate Oral Intake   Nutrition Imbalanced: Less than Body Requirements: Related Risk Factors chronic illness/infection   Signs and Symptoms (Nutrition Imbalance, Inadequate Oral Intake: Signs and Symptoms) (nausea )     Goal: Improved Oral Intake  Patient will demonstrate the desired outcomes by discharge/transition of care.   Outcome: Ongoing (interventions implemented as appropriate)   04/12/17 0657   Nutrition, Imbalanced: Inadequate Oral Intake (Adult)   Improved Oral Intake making progress toward outcome     Goal: Prevent Further Weight Loss  Patient will demonstrate the desired outcomes by discharge/transition of care.   Outcome: Ongoing (interventions implemented as appropriate)   04/12/17 0657   Nutrition, Imbalanced: Inadequate Oral Intake (Adult)   Prevent Further Weight Loss making progress toward outcome       Problem: Seizure Disorder/Epilepsy (Adult)  Goal: Signs and Symptoms of Listed  Potential Problems Will be Absent or Manageable (Seizure Disorder/Epilepsy)  Signs and symptoms of listed potential problems will be absent or manageable by discharge/transition of care (reference Seizure Disorder/Epilepsy (Adult) CPG).  Outcome: Ongoing (interventions implemented as appropriate)   04/12/17 0657    Seizure Disorder/Epilepsy   Problems Assessed (Seizure Disorder/Epilepsy) all   Problems Present (Seizure Disorder/Epilepsy) none

## 2017-04-12 NOTE — Nurse Focus (Signed)
End of Shift Statement      Patient Progress per diagnosis: Patient stable. Vitals within normal limits. Rapid response team and Hospitalist called to examine patient for suspected seizure like activity. Patient had 5 back to back episodes around 0400 of not responding. Staring at ceiling. Shaking and clenching fists. Doctor came to bedside to examine patient. No orders received. Only to continue to monitor patient. GI consult today for NJ tube. Patient okay to take po medication per hospitalist. Patient is Alert and oriented x 4. Able to follow commands. Vitals remain stable. No other suspected seizure like activity noted since that occurrence.     Significant events: Suspected seizure like activity around 0400.     Pain reported during shift: Moderate  Action taken: Ultram  administered and reassessed in the appropriate timeframe for the medication given  Reassessment: reports medication effective in relieving pain.  Patient was able to rest comfortably  Side Effects: None   Education provided on medication ultram    Georgann HousekeeperLisa M Mane Consolo, RN
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
## 2017-04-12 NOTE — Progress Notes (Signed)
Outpatient Care Mgmt will not be following patient due to patient has no PCP.

## 2017-04-12 NOTE — Nurse Transfer Note (Signed)
PACU Transfer Note, RN Accompanying Patient  Note started on 04/12/2017, 17:36    [facility] at 1720 hours. Bed down, locked, side rails up times 4, and call light in reach. Belongings in property room. Report in EMR and RN notified of arrival. Assessment unchanged. VS WDL. Significant other at bedside. Margaree MackintoshLauren E Jenkins Risdon, RN

## 2017-04-12 NOTE — Allied Health Progress (Signed)
[facility] PROGRESS NOTE(S)    MSW attempted to see Pt this am, she was actively having a seizure. CM Randi and CM Verlon AuLeslie were taking appropriate steps to assists Pt.  Social Services will contine to follow.  Gala RomneyAmy Nunzio Banet, MSW   Social Services  04/12/2017  11:40

## 2017-04-12 NOTE — Op Note (Signed)
GENERAL SURGERY OPERATIVE NOTE  Service GEN SURG  04/12/2017, 15:20    Date of Service: 04/12/2017    Pre-Op Diagnosis:  Severe protein-calorie malnutrition [E43]    Post-Op Diagnosis:  Same    Procedure Performed/Description:  Procedure(s) with comments:  PLACEMENT, FEEDING TUBE, JEJUNOSTOMY, LAPAROSCOPIC (N/A) - LAPAROSCOPIC JEJUNOSTOMY FEEDING TUBE PLACEMENT    Name of Surgeon and Assistants:  Surgeon(s) and Role:     * Mable Parisraig R Marisal Swarey, MD - Primary    Type of Anesthesia:  General    DVT Prophylaxis: SCDs    Findings:  Placed 10cm from the ligament of treitz    Specimens Removed:  * No specimens in log *    EBL:  * No values recorded between 04/12/2017  2:29 PM and 04/12/2017  3:20 PM *    Drains:  12 fr JT    Fluids:   ml    Complications:  None    Outcome: Stable, to PACU    PROCEDURE IN DETAIL:  Patient was identified in the preoperative holding area. Consent was verified and patient brought back to the operating room and placed in the supine position. Appropriate surgical pause was obtained and patient prepped and draped in the usual sterile fashion.         Towel clips were placed on the umbilicus and tented up. An incision was made in the umbilicus and a Veress needle then passed without difficulty. This was aspirated and then flushed with no abnormalities. The initial pressure was 0. He was inflated to 15mmhg. A 5mm port was then passed while tenting the abdominal wall up with the towel clips. 2 other 5mm ports were passed under direct visualization on wither side of the umbilicus. No trauma was created.   The pt was then  placed in reverse trendelenburg. The colon lifted and the ligament of treitz found. 10 cm from this the jejunum was grasped and reached the abdominal wall without difficulty. The jejunum was then grasped in a way to insert a T needle into the lumen and deploy the T. This T was retracted to the abdominal walll and the other 2 T's were placed intraluminally distal to the first and on wither  side of the mesenteric side of the bowel forming a triangle. The needle was then passed in the center of the 3 T's into the bowel and the wire passed over it into the distal bowel. The wire could be seen in the bowel. The  . The T 's were pulled taught and the clasps used to secure the jejunum to the wall. .  The wire then advanced without difficulty. The skin was the cut to accommodate the JT and dilator then passed over the wire.The dilator system was dilated with each sequentially larger dilator to the size above the JT size and then removed. The JT was then passed over the wire into the jejunum.  The JT balloon was inflated with 2.165ml of salineand had great approximation with the wall.  Good position and approximation was noted and the abdomen evacuated f all CO2. The JT anchored to the skin after the disk was placed in the correct position. With a2-0 nylon stitch. The wounds were then dressed.    The patient tolerated the procedure well, was extubated, and taken to the PACU in stable condition.    Odella Aquasraig R. Hebert Sohohayer MD,FACS

## 2017-04-12 NOTE — Nurse Assessment (Signed)
Received report from Dr. Dareen PianoAnderson and Autumn Messing. Ling, RN. O2 applied via simple mask. Trachea clear. No accessory muscle use. No retractions. IV to IVAC. Abd soft. GTube dressing C/D/I. Continue SCDs. Noted IVF up D5/0.45NS. Changed to LR per Dr. Ewell PoeAnderson's request.

## 2017-04-12 NOTE — Consults (Signed)
GENERAL SURGERY HISTORY AND PHYSICAL OR CONSULT    CHIEF COMPLAINT: gastroparesis and sma syndrome, pulled feeding nasogastric tube out.     HISTORY OF PRESENT ILLNESS     30yr female presents with gastroparesis and sma syndrome, pulled feeding nasogastric tube out.She was not fed well via the NGT feeds due to the above. She now needs a feeding JT. No other c/o.      Review of Systems:   General: no fevers, chills, weight loss  HEENT: denies changes in vision or hearing, no rhinitis/congestion/sore throat  CV: denies chest pain, palpitations  Resp: denies SOB, DOE  GI: see HPI, denies nausea, vomiting, diarrhea, constipation  GU: denies dysuria, difficulty voiding   MSK: denies muscle/joint pain  Endo: no issues  Neuro: no major deficits or changes in mood     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    Hypertension 04/11/2017    Seizure disorder 04/11/2017       Past Surgical History:  Past Surgical History:   Procedure Laterality Date    INSERTION, TYMPANOSTOMY TUBE  05/26/2014       Family History:   No family history on file.  No history of bleeding, clotting, problems with anesthesia.     Social History:   Social History     Social History    Marital status: SINGLE     Spouse name: N/A    Number of children: N/A    Years of education: N/A     Social History Main Topics    Smoking status: None    Smokeless tobacco: None    Alcohol use None    Drug use: None    Sexual activity: Not Asked     Other Topics Concern    None     Social History Narrative         Outpatient Medications:  No current facility-administered medications on file prior to encounter.      No current outpatient prescriptions on file prior to encounter.       Inpatient Medications:  Budesonide/Formoterol (SYMBICORT) 160-4.5 mcg/actuation Inhaler 2 puff, INHALATION, BID  Calcium Carbonate (TUMS) Chewable Tablet 750 mg, ORAL, QAM w/ meal  Cetirizine  (ZYRTEC) Tablet 10 mg, ORAL, Daily 2100  Ciprofloxacin (CIPRO) 400 mg in D5W 200 mL IVPB, IV, Q12H Now  Dronabinol (MARINOL) Capsule 10 mg, ORAL, TID  [START ON 04/22/2017] Estradiol patch REMOVAL, Transdermal, Sat  [START ON 04/15/2017] Estradiol Weekly (CLIMARA) 0.1 mg/24 hr Patch 1 patch, Transdermal, Sat  FamoTIDine (PEPCID) Tablet 20 mg, ORAL, BID  Fluoxetine (PROZAC) Capsule/Tablet 60 mg, ORAL, QAM  food supplemt, lactose-reduced 0.06 gram- 1.5 kcal/mL 1 can, ORAL, TID w/ meals  [START ON 04/13/2017] Heparin PF 5,000 units/0.5 mL Injection 5,000 Units, SUBCUTANEOUS, Q8H (16,10,96)  Lactobacillus (CULTURELLE) Sprinkle Capsule 1 Sprinkle Capsule, ORAL, BIDAC  LevETIRAcetam (KEPPRA) Tablet 1,500 mg, ORAL, BID  MedroxyPROGESTERone (PROVERA) Tablet 10 mg, ORAL, Daily 1000  Metoclopramide (REGLAN) Tablet 10 mg, ORAL, QID  Montelukast (SINGULAIR) Tablet 10 mg, ORAL, Daily 2100  nutritional supplements 0.07 gram-1.5 kcal/mL 1 Dose, J-TUBE, TID  Pantoprazole (PROTONIX) Delayed Release Tablet 20 mg, ORAL, QAM AC  Polyethylene Glycol 3350 (MIRALAX) Oral Powder Packet 17 g, ORAL, BID  Pregabalin (LYRICA) Capsule 75 mg, ORAL, TID  Saline Lock Flush 2.5 mL, IV, Q8H (04,54,09)  Scopolamine (TRANSDERM-SCOP) Patch 1 patch, Transdermal, Q72H Now  [START ON 04/15/2017] Scopolamine patch REMOVAL  1 patch, Transdermal, Q72H Now  Sodium Chloride Tablet 2 g, ORAL, QAM      D5 / 0.45% NaCl, , IV, CONTINUOUS, Last Rate: 100 mL/hr at 04/12/17 0435      Acetaminophen (TYLENOL) Tablet 650 mg, ORAL, Q6H PRN  Albuterol (PROAIR HFA, PROVENTIL HFA, VENTOLIN HFA) Inhaler 1-2 puff, INHALATION, Q4H PRN  Albuterol (PROVENTIL, VENTOLIN) 2.5 mg/0.5 mL Solution for Nebulization 2.5 mg, NEBULIZATION, Q6H PRN  Bisacodyl (DULCOLAX) Suppository 10 mg, RECTALLY, Q24H PRN  Diazepam (VALIUM) Tablet 5 mg, ORAL, Q6H PRN  Ibuprofen (CHILDREN'S MOTRIN, ADVIL) 100 mg/5 mL Suspension 600 mg, ORAL, Q6H PRN  Lidocaine (XYLOCAINE) 10 mg/mL (1%) Injection 0.2 mL,  INTRADERMAL, PRN  Lorazepam (ATIVAN) Injection 1 mg, IV, Code/Trauma/Sedation PRN  Lorazepam (ATIVAN) Tablet 2 mg, ORAL, Q8H PRN  Magnesium Hydroxide (MILK OF MAGNESIA) 400 mg/5 mL Suspension 30 mL, ORAL, Q24H PRN  Melatonin Tablet 9 mg, ORAL, Bedtime PRN  Naloxone (NARCAN) Injection 0.08 mg, IV, Q2MIN PRN  Naloxone (NARCAN) Injection 0.2 mg, IV, PRN  Ondansetron (ZOFRAN) Injection 4 mg, IV, Q8H PRN  Oxycodone (ROXICODONE) Tablet 10 mg, ORAL, Q6H PRN  Promethazine (PHENERGAN) Injection 6.25 mg, IV, Q4H PRN  Promethazine (PHENERGAN) Tablet 25 mg, ORAL, Q8H PRN  Saline Lock Flush 2.5 mL, IV, PRN  Sumatriptan (IMITREX) Tablet 50 mg, ORAL, PRN  Tramadol (ULTRAM) Tablet 50 mg, ORAL, Q8H PRN        Allergies    Amoxicillin    Hives  Asa [Aspirin]    Unknown-Explain in Comments    Comment:bleeding  Basiliximab    Angioedema  Betadine [Povidone-Iodine]    Hives  Docusate Sodium    Anaphylaxis  Doxycycline    Rash  Garlic    Anaphylaxis  Penicillin    Hives  Shellfish Containing Products    Anaphylaxis  Sweet Potato    Hives  Vancomycin    Rash    VITAL SIGNS   Temp: 36 C (96.8 F)  BP: 104/69  Pulse: 62  Resp: 20  SpO2: 98 %      Weight: 43.6 kg (96 lb 1.9 oz) Body mass index is 18.77 kg/(m^2).    PHYSICAL EXAM  General Appearance: no acute distress, pleasant, cooperative  HEENT: sclera anicteric  Lungs: clear to auscultation, breathing comfortably on room air  Cardiovascular: regular rate and rhythm  Abdomen: Soft, non-tender, non-distended  Rectal: ne  Extremities: no peripheral edema  Skin: No rashes or lesions on exposed skin  Neuro: Alert and oriented x 4, no gross abdnormality  Musculoskeletal: grossly intact    LAB TESTS  Recent labs for the past 72 hours     04/12/17 0810 04/12/17 0455 04/11/17 1944    WHITE BLOOD CELL COUNT -- 5.5 8.1    HEMOGLOBIN -- 11.0* 10.6*    HEMATOCRIT -- 33.1* 30.3*    PLATELET COUNT -- 300 319    APTT -- -- --    INR 1.06 -- --     Recent labs for the past 72 hours     04/12/17  0455 04/11/17 1944    SODIUM 139 138    POTASSIUM 3.2* 3.6    CHLORIDE 109 106    CARBON DIOXIDE TOTAL 24 24    UREA NITROGEN, BLOOD (BUN) 5* 8    CREATININE BLOOD 0.56 0.79    GLUCOSE 90 90    CALCIUM 8.3* 8.7    MAGNESIUM (MG) -- --    PHOSPHORUS (PO4) -- --     POC Glucose,  blood: --   HEPATIC FUNCTION PANEL   Recent labs for the past 72 hours     04/11/17 1944    PROTEIN 5.7*    ALBUMIN 3.0*    BILIRUBIN TOTAL 0.3    ALKALINE PHOSPHATASE (ALP) 93    ASPARTATE TRANSAMINASE (AST) 15    ALANINE TRANSFERASE (ALT) 19    BILIRUBIN DIRECT --        IMAGING STUDIES  CT Cspine:for seizures:Impression:   1. No evidence of fracture or subluxation of the cervical spine. Essentially normal examination.  2. Preliminary report was provided by Dr. Heron Nay to the emergency department, and there is no discrepancy.    CT Head:Impression: No evidence of intracranial hemorrhage, mass, mass effect, edema, or other acute intracranial disease process. Normal examination.    ASSESSMENT/PLAN:  22yr female with Gastroparesis, SMA syndrome, seizures and malnutrition who needs a feeding tube placed beyond her pathology which is the Jejunum. SHe will have it placed today at 12;30 ish via laparoscopy. The patient has been informed of the anatomy, physiology, pathophysiology along with the procedure and its risks, benefits, and alternatives which include but are not limited to bleeding, infection, injury to adjacent structures and their consequences, anesthesia, death and significant disability. With the above and questions answered, the patient is willing to proceed.        Odella Aquas. Hebert Soho, MD,FACS

## 2017-04-12 NOTE — Anesthesia Procedure Notes (Signed)
Airway  Date/Time: 04/12/2017 2:20 PM    Staff    Patient location:  OR  Danise EdgeANDERSON, Chloe Barron.        Indications and Patient Condition    Spontaneous Ventilation: absent  Sedation level: level 0: deep/analgesia  Preoxygenated: yes  Patient position: sniffing  MILS maintained throughout  Mask difficulty assessment: 0 - not attempted    Final Airway Details    Final airway type: endotracheal airway      Successful airway: cuffed ETT    Successful intubation technique: video laryngoscopy  Facilitating devices/methods: cricoid pressure  Endotracheal tube insertion site: oral  Blade size: #3  ETT size: 6.5 mm  Cormack-Lehane Classification: grade I - full view of glottis  Placement verified by: chest auscultation and capnometry   Measured from: lips  Secured at 21 cm.  Number of attempts at approach: 1  Number of other approaches attempted: 0    Additional Comments  Atraumatic intubation with Glidescope blade performed.  Breathe sounds bilaterally equal in ascultation, and secured ETT with tape. An oral airway bite block placed to protect tongue and lips.

## 2017-04-12 NOTE — H&P (Signed)
Dictated [id number]

## 2017-04-12 NOTE — Anesthesia Preprocedure Evaluation (Addendum)
Anesthesia Evaluation    History of Present Illness  Adrenal insufficiency  Diamond-Blackfan anemia  Seizure disorder     Lab Results       Lab Name                 Value               Date/Time                  WBC                      5.5                 04/12/2017 04:55 AM        HGB                      11.0 (L)            04/12/2017 04:55 AM        HCT                      33.1 (L)            04/12/2017 04:55 AM        PLT                      300                 04/12/2017 04:55 AM   Lab Results       Lab Name                 Value               Date/Time                  NA                       139                 04/12/2017 04:55 AM        K                        3.2 (L)             04/12/2017 04:55 AM        CL                       109                 04/12/2017 04:55 AM        CO2                      24                  04/12/2017 04:55 AM        BUN                      5 (L)               04/12/2017 04:55 AM        CR                       0.56  04/12/2017 04:55 AM        GLU                      90                  04/12/2017 04:55 AM    Lab Results       Lab Name                 Value               Date/Time                  INR                      1.06                04/12/2017 08:10 AM   Airway   Mallampati: II  TM distance: >3 FB     Neck ROM is full.     Dental    (+) poor dentition   Pulmonary - normal exam  (+) asthma,  Cardiovascular   (+) hypertension (),      Rhythm: regular  Rate: normal     Neuro/Psych    (+) seizures (),    GI/Hepatic/Renal   GI exam normal  (+) GERD (well controlled),        Endo/Other  ,                        Anesthesia Plan    ASA 3     general   (Consent general anesthesia and endotracheal or LMA intubation.  Alternatives, risks, and benefits were discussed including death, sore throat, and PONV.  Expresses understanding and wishes to proceed.)  intravenous induction   I personally performed a physical assessment on this patient.

## 2017-04-12 NOTE — Nurse Focus (Signed)
End of Shift Statement      Patient Progress per diagnosis: Pt. Went to surgery for a J tube placement. She continues to c/o nausea and abdominal pain. Morphine and phenergan effective. Pt. Did have one seizure today; MD aware and surgery notified prior to pt. Going.    Significant events: Seizure and surgical placement to J tube.    Pain reported during shift: Severe  Action taken: non-pharmacologic interventions were implemented and morphine administered and reassessed in the appropriate timeframe for the medication given  Reassessment: reports medication effective in relieving pain.  Patient was Able to rest comfortably  Side Effects: none  Education provided on medication morphine      Youlanda RoysAdrienne K Kissie Ziolkowski, RN

## 2017-04-12 NOTE — Progress Notes (Signed)
GASTROENTEROLOGY    Asked to evaluate this pt with symptomatic SMA syndrome and presumed gastroparesis---N,V, weight loss, pain. [place] with [place] tube but became dislodged during apparent "seizure" yesterday. Pt admitted for NJ replacement.    Surgical jejunal tube better option if we are to bypass subtotal duodenal obstruction. Call out to on call surgeon    Pt did not receive heparin dose this am and is npo except meds    Selinda Orionavid Jay Tadarius Maland, MD   Gastroenterology Hospitalist

## 2017-04-12 NOTE — Anesthesia Postprocedure Evaluation (Signed)
Patient: Chloe Barron    PLACEMENT, FEEDING TUBE, JEJUNOSTOMY, LAPAROSCOPIC    Anesthesia Type: general    Vital Signs (Last Recorded):  BP: 109/67 (04/12/17 1537)  Pulse: 72 (04/12/17 1537)  Resp: 12 (04/12/17 1537)  Temp Max: 36.6 C (97.8 F)  (Last 24 hours)  Temp: 36.3 C (97.3 F) (04/12/17 1537)  SpO2: 100 % (04/12/17 1537) on    O2 Device: nasal cannula (04/12/17 1200)      Anesthesia Post Evaluation    Procedure: Procedure(s):PLACEMENT, FEEDING TUBE, JEJUNOSTOMY, LAPAROSCOPIC  [facility]  Anesthesia: General    Patient location during evaluation: PACU  Patient participation: complete - patient participated  Level of consciousness: sleepy but conscious  Pain score: 3  Pain management: adequate  Airway patency: patent  Anesthetic complications: no  Cardiovascular status: stable  Respiratory status: face mask  Hydration status: euvolemic  Nausea and Vomiting: absent     Danise EdgeSean Foroudi Akim Watkinson, MD        Danise EdgeSean Foroudi Marenda Accardi, MD

## 2017-04-12 NOTE — ED Nursing Note (Signed)
Report received from Taylor, RN. Assumed pt care.

## 2017-04-12 NOTE — Progress Notes (Signed)
HOSPITALIST MEDICINE DAILY PROGRESS NOTE  Date: 04/12/2017  Time:  13:29          SUBJECTIVE:  Chloe Barron had a seizure now she is post-ictal,scheduled for J-tube later today.      MEDICATIONS:  Scheduled Medications  Budesonide/Formoterol (SYMBICORT) 160-4.5 mcg/actuation Inhaler 2 puff, INHALATION, BID  Calcium Carbonate (TUMS) Chewable Tablet 750 mg, ORAL, QAM w/ meal  Cetirizine (ZYRTEC) Tablet 10 mg, ORAL, Daily 2100  Ciprofloxacin (CIPRO) 400 mg in D5W 200 mL IVPB, IV, Q12H Now, Last Rate: 400 mg (04/12/17 1105)  Dronabinol (MARINOL) Capsule 10 mg, ORAL, TID  [START ON 04/22/2017] Estradiol patch REMOVAL, Transdermal, Sat  [START ON 04/15/2017] Estradiol Weekly (CLIMARA) 0.1 mg/24 hr Patch 1 patch, Transdermal, Sat  FamoTIDine (PEPCID) Tablet 20 mg, ORAL, BID  Fluoxetine (PROZAC) Capsule/Tablet 60 mg, ORAL, QAM  food supplemt, lactose-reduced 0.06 gram- 1.5 kcal/mL 1 can, ORAL, TID w/ meals  [START ON 04/13/2017] Heparin PF 5,000 units/0.5 mL Injection 5,000 Units, SUBCUTANEOUS, Q8H (16,10,96)  Lactobacillus (CULTURELLE) Sprinkle Capsule 1 Sprinkle Capsule, ORAL, BIDAC  LevETIRAcetam (KEPPRA) 1,500 mg in NaCl 0.9% 100 mL IVPB, IV, BID, Last Rate: 1,500 mg (04/12/17 1033)  LevETIRAcetam (KEPPRA) Tablet 1,500 mg, ORAL, BID  MedroxyPROGESTERone (PROVERA) Tablet 10 mg, ORAL, Daily 1000  Metoclopramide (REGLAN) Tablet 10 mg, ORAL, QID  Montelukast (SINGULAIR) Tablet 10 mg, ORAL, Daily 2100  nutritional supplements 0.07 gram-1.5 kcal/mL 1 Dose, J-TUBE, TID  Pantoprazole (PROTONIX) Delayed Release Tablet 20 mg, ORAL, QAM AC  Polyethylene Glycol 3350 (MIRALAX) Oral Powder Packet 17 g, ORAL, BID  Pregabalin (LYRICA) Capsule 75 mg, ORAL, TID  Saline Lock Flush 2.5 mL, IV, Q8H (04,54,09)  Scopolamine (TRANSDERM-SCOP) Patch 1 patch, Transdermal, Q72H Now  [START ON 04/15/2017] Scopolamine patch REMOVAL 1 patch, Transdermal, Q72H Now  Sodium Chloride Tablet 2 g, ORAL, QAM    IV Medications  D5 / 0.45% NaCl, , IV, CONTINUOUS, Last  Rate: 100 mL/hr at 04/12/17 1105    PRN Medications  Acetaminophen (TYLENOL) Tablet 650 mg, ORAL, Q6H PRN  Albuterol (PROAIR HFA, PROVENTIL HFA, VENTOLIN HFA) Inhaler 1-2 puff, INHALATION, Q4H PRN  Albuterol (PROVENTIL, VENTOLIN) 2.5 mg/0.5 mL Solution for Nebulization 2.5 mg, NEBULIZATION, Q6H PRN  Bisacodyl (DULCOLAX) Suppository 10 mg, RECTALLY, Q24H PRN  Diazepam (VALIUM) Tablet 5 mg, ORAL, Q6H PRN  Ibuprofen (CHILDREN'S MOTRIN, ADVIL) 100 mg/5 mL Suspension 600 mg, ORAL, Q6H PRN  Lidocaine (XYLOCAINE) 10 mg/mL (1%) Injection 0.2 mL, INTRADERMAL, PRN  Lorazepam (ATIVAN) Injection 1 mg, IV, Code/Trauma/Sedation PRN  Lorazepam (ATIVAN) Tablet 2 mg, ORAL, Q8H PRN  Magnesium Hydroxide (MILK OF MAGNESIA) 400 mg/5 mL Suspension 30 mL, ORAL, Q24H PRN  Melatonin Tablet 9 mg, ORAL, Bedtime PRN  Morphine Injection 3 mg, IV, Q4H PRN  Naloxone (NARCAN) Injection 0.08 mg, IV, Q2MIN PRN  Naloxone (NARCAN) Injection 0.2 mg, IV, PRN  Ondansetron (ZOFRAN) Injection 4 mg, IV, Q8H PRN  Oxycodone (ROXICODONE) Tablet 10 mg, ORAL, Q6H PRN  Promethazine (PHENERGAN) Injection 6.25 mg, IV, Q4H PRN  Promethazine (PHENERGAN) Tablet 25 mg, ORAL, Q8H PRN  Saline Lock Flush 2.5 mL, IV, PRN  Sumatriptan (IMITREX) Tablet 50 mg, ORAL, PRN  Tramadol (ULTRAM) Tablet 50 mg, ORAL, Q8H PRN      OBJECTIVE:   Vital Signs  Summary  Temp Min: 35.9 C (96.6 F) Max: 36.6 C (97.8 F)  BP: (92-109)/(49-70)   Pulse Min: 54 Max: 80  Resp Min: 16 Max: 20  SpO2 Min: 98 % Max: 100 %  Current Vitals  Temp: (!) 35.9 C (96.6 F)  BP: 102/59  Pulse: 64  Resp: 16  SpO2: 100 %  Flow (L/min): 2   Weight: 43.6 kg (96 lb 1.9 oz)     Intake and Output  Last Two Completed Shifts  In: 800 [Crystalloid:600]  Out: 801 [Urine:800; Stool:1]    Current Shift  In: -   Out: 1600 [Urine:1600]    Physical Exam  General Appearance: somnolent.   Eyes: conjunctivae and corneas clear. PERRL, EOM's intact. sclerae normal.   Ears: not examined.   Nose: not examined  .  Mouth: not examined.   Neck: Neck supple. No adenopathy, thyroid symmetric, normal size.   Heart: normal rate and regular rhythm, no murmurs, clicks, or gallops.   Lungs: not examined.   Abdomen: BS normal.  Abdomen soft, non-tender.  No masses or organomegaly.   Extremities: no cyanosis, clubbing, or edema.   Skin: Skin color, texture, turgor normal. No rashes or lesions.   Rectal: not examined.   Neuro: not examined.   Mental Status: not examined.  Musculoskeletal: not examined.        IMAGING:   [facility] Ct Head Without Contrast    Result Date: 04/12/2017  Technique: Noncontrast head CT from base of skull to vertex. Following helical acquisition, transaxial slices and sagittal and coronal reformatted images were obtained. INDICATIONS: Episodes of seizure.  Comparison: None. Findings: Examination is somewhat limited by artifacts from the scanner table. There is no evidence of intracranial hemorrhage, gross acute infarct, mass, mass effect, edema, or abnormal extra-axial fluid collection. Brain parenchyma appears normal. The skull is intact. Ventricles, sulci, and cisterns are all within normal limits. Visualized portions of the paranasal sinuses, mastoid air cells, and middle ear airspaces are essentially clear. Orbits and extracranial soft tissues are unremarkable.     Impression: No evidence of intracranial hemorrhage, mass, mass effect, edema, or other acute intracranial disease process. Normal examination. Preliminary report was provided by Dr. Heron NayKhalil to the emergency department, and there is no discrepancy. Radiation Dose Indicators: CTDI 45.2 mGy, DLP 937 mGy-cm    [facility] Ct Cervical Spine    Result Date: 04/12/2017  Technique: Contiguous thin section axial images were obtained through the cervical spine, followed by multiplanar image reconstructions. INDICATIONS: Seizures. Comparison: None. Findings: There is no evidence of fracture or subluxation. Cervical spine alignment is essentially normal, except for  straightening of cervical lordosis and a slight rightward tilt and rotation, which are probably secondary to patient positioning. Visualized vertebrae all appear normal. Facet joints are also intact and appear well aligned. No gross disc herniation or significant disc bulge is seen. Central canal and neural foramina appear patent throughout. Paravertebral soft tissues are unremarkable.     Impression: 1. No evidence of fracture or subluxation of the cervical spine. Essentially normal examination. 2. Preliminary report was provided by Dr. Heron NayKhalil to the emergency department, and there is no discrepancy. Radiation Dose Indicators: CTDI 7.1 mGy, DLP 170 mGy-cm      LABS:  Lab Results - 24 hours (excluding micro and POC)   CBC WITH DIFFERENTIAL     Status: Abnormal   Result Value Status    White Blood Cell Count 8.1 Final    Red Blood Cell Count 3.30 (L) Final    Hemoglobin 10.6 (L) Final    Hematocrit 30.3 (L) Final    MCV 91.8 Final    MCH 32.1 Final    MCHC 35.0 Final    RDW 13.1 Final  MPV 9.5 Final    Platelet Count 319 Final    Neutrophils % Auto 66.4 Final    Lymphocytes % Auto 24.5 Final    Monocytes % Auto 5.3 Final    Eosinophil % Auto 2.8 Final    Basophils % Auto 0.4 Final    Immature Granulocytes % 0.60 (H) Final    Neutrophil Abs Auto 5.39 (H) Final    Lymphocyte Abs Auto 2.0 Final    Monocytes Abs Auto 0.4 Final    Eosinophil Abs Auto 0.2 Final    Basophils Abs Auto 0.0 Final    Nucleated RBC % Auto 0.0 Final    NRBC Abs Auto 0.0 Final    Immature Granulocytes Abs Auto 0.1 (H) Final   COMPREHENSIVE METABOLIC PANEL     Status: Abnormal   Result Value Status    Sodium 138 Final    Potassium 3.6 Final    Chloride 106 Final    Carbon Dioxide Total 24 Final    Urea Nitrogen, Blood (BUN) 8 Final    Glucose 90 Final    Calcium 8.7 Final    Protein 5.7 (L) Final    Albumin 3.0 (L) Final    Alkaline Phosphatase (ALP) 93 Final    Aspartate Transaminase (AST) 15 Final    Bilirubin Total 0.3 Final    Alanine  Transferase (ALT) 19 Final    Creatinine Serum 0.79 Final    Globulin 2.7 Final    Alb/Glob Ratio 1.1 Final    BUN/ Creatinine 10.1 Final    E-GFR 94 Final    E-GFR, Non-African American 94 Final   BETA HCG QUANT, PREGNANCY     Status: Normal   Result Value Status    Beta HCG Quant <0.5 Final   UR DRUGS OF ABUSE SCREEN     Status: Abnormal   Result Value Status    Benzodiazepines Screen, Urine POSITIVE (Abnl) Final    Cocaine Metabolite Scrn, Urine NEGATIVE Final    Amphetamine Screen, Urine NEGATIVE Final    Opiates Screen, Urine NEGATIVE Final    Barbiturates Screen, Urine NEGATIVE Final    PCP, Urine NEGATIVE Final    Cannabinoid, Urine POSITIVE (Abnl) Final    Tricyclics, Urine NEGATIVE Final   URINALYSIS-COMPLETE     Status: Abnormal   Result Value Status    Color Yellow Final    Clarity Hazy (Abnl) Final    Specific Gravity  1.015 Final    pH Urine 7.0 Final    Protein Negative Final    Glucose Negative Final    Ketones Negative Final    Bilirubin Negative Final    Urobilinogen Negative Final    Occult Blood  Negative Final    Leukocyte Esterase Small (Abnl) Final    Nitrite Negative Final    RBC 1 Final    WBC 14 (H) Final    Bacteria Many (Abnl) Final    Squamous Epithelial Cells 5 Final    Mucous Few Final   CBC WITH DIFFERENTIAL     Status: Abnormal   Result Value Status    White Blood Cell Count 5.5 Final    Red Blood Cell Count 3.53 (L) Final    Hemoglobin 11.0 (L) Final    Hematocrit 33.1 (L) Final    MCV 93.8 Final    MCH 31.2 Final    MCHC 33.2 Final    RDW 13.2 Final    MPV 9.5 Final    Platelet Count 300 Final  Neutrophils % Auto 56.3 Final    Lymphocytes % Auto 32.0 Final    Monocytes % Auto 5.9 Final    Eosinophil % Auto 4.6 (H) Final    Basophils % Auto 0.5 Final    Immature Granulocytes % 0.70 (H) Final    Neutrophil Abs Auto 3.08 Final    Lymphocyte Abs Auto 1.8 Final    Monocytes Abs Auto 0.3 Final    Eosinophil Abs Auto 0.3 (H) Final    Basophils Abs Auto 0.0 Final    Nucleated RBC % Auto  0.0 Final    NRBC Abs Auto 0.0 Final    Immature Granulocytes Abs Auto 0.0 Final   BASIC METABOLIC PANEL     Status: Abnormal   Result Value Status    Sodium 139 Final    Potassium 3.2 (L) Final    Chloride 109 Final    Carbon Dioxide Total 24 Final    Calcium 8.3 (L) Final    Glucose 90 Final    Urea Nitrogen, Blood (BUN) 5 (L) Final    Creatinine Serum 0.56 Final    BUN/ Creatinine 8.9 Final    E-GFR 139 Final    E-GFR, Non-African American 139 Final   [facility] PROTHROMBIN TIME/INR     Status: Normal   Result Value Status    Prothrombin Time 12.3 Final    INR 1.06 Final         ASSESSMENT & PLAN:         Patient Active Hospital Problem List:  Severe protein-calorie malnutrition (04/12/2017)    Assessment: scheduled for j-tube    Plan: appreciate gastroenterology and surgery consult  seizures  Intravenous keppra started        DVT Prophylaxis - scd     .sig    Farzana Koci Burney Gauze, MD, MD

## 2017-04-12 NOTE — ED Nursing Note (Signed)
Report to Mandy, RN

## 2017-04-13 DIAGNOSIS — E43 Unspecified severe protein-calorie malnutrition: Secondary | ICD-10-CM

## 2017-04-13 DIAGNOSIS — G40909 Epilepsy, unspecified, not intractable, without status epilepticus: Secondary | ICD-10-CM

## 2017-04-13 LAB — CBC WITH DIFFERENTIAL
BASOPHILS % AUTO: 0.2 % (ref 0.0–1.0)
BASOPHILS ABS AUTO: 0 10*3/uL (ref 0.0–0.1)
EOSINOPHIL % AUTO: 0 % (ref 0.0–4.0)
EOSINOPHIL ABS AUTO: 0 10*3/uL (ref 0.0–0.2)
Hematocrit: 34.2 % — ABNORMAL LOW (ref 36.0–48.0)
Hemoglobin: 11.7 g/dL — ABNORMAL LOW (ref 12.0–16.0)
IMMATURE GRANULOCYTES % AUTO: 0.5 % (ref 0.00–0.50)
IMMATURE GRANULOCYTES ABS AUTO: 0 10*3/uL (ref 0.0–0.0)
LYMPHOCYTE ABS AUTO: 0.6 10*3/uL — AB (ref 1.3–2.9)
LYMPHOCYTES % AUTO: 9.8 % (ref 5.0–41.0)
MCH: 32.1 pg (ref 27.0–34.0)
MCHC g/dL: 34.2 g/dL (ref 33.0–37.0)
MCV: 93.7 fL (ref 82.0–97.0)
MONOCYTES % AUTO: 0.9 % (ref 0.0–10.0)
MONOCYTES ABS AUTO: 0.1 10*3/uL — AB (ref 0.3–0.8)
MPV: 9.1 fL — AB (ref 9.4–12.4)
NEUTROPHIL ABS AUTO: 5.61 10*3/uL — AB (ref 2.20–4.80)
NEUTROPHILS % AUTO: 88.6 % — AB (ref 45.0–75.0)
NUCLEATED CELL COUNT: 0 10*3/uL (ref 0.0–0.1)
NUCLEATED RBC/100 WBC: 0 %{WBCs} (ref <=0.0)
Platelet Count: 343 10*3/uL (ref 151–365)
RDW: 13.2 % (ref 11.5–14.5)
RED CELL COUNT: 3.65 10*6/uL — AB (ref 3.80–5.10)
White Blood Cell Count: 6.3 10*3/uL (ref 4.2–10.8)

## 2017-04-13 LAB — BASIC METABOLIC PANEL
BUN/CREATININE_MMC: 5.2 — AB (ref 7.3–21.7)
CARBON DIOXIDE TOTAL: 25 mmol/L (ref 22–32)
CHLORIDE: 107 mmol/L (ref 99–109)
Calcium: 9 mg/dL (ref 8.7–10.2)
Creatinine Serum: 0.58 mg/dL (ref 0.50–1.30)
E-GFR, NON-AFRICAN AMERICAN: 134 mL/min/{1.73_m2} (ref 60–?)
E-GFR_MMC: 134 mL/min/{1.73_m2} (ref 60–?)
GLUCOSE: 132 mg/dL — AB (ref 70–99)
POTASSIUM: 3.8 mmol/L (ref 3.5–5.2)
SODIUM: 140 mmol/L (ref 134–143)
UREA NITROGEN, BLOOD (BUN): 3 mg/dL — AB (ref 6–21)

## 2017-04-13 LAB — CULTURE URINE, BACTI

## 2017-04-13 MED ORDER — LEVETIRACETAM 500 MG TABLET
2000.0000 mg | ORAL_TABLET | Freq: Two times a day (BID) | ORAL | Status: DC
Start: 2017-04-13 — End: 2017-04-13

## 2017-04-13 MED ORDER — LEVETIRACETAM 500 MG/100 ML IN SODIUM CHLORIDE (ISO-OSM) IV PIGGYBACK
500.0000 mg | INJECTION | Freq: Once | INTRAVENOUS | Status: AC
Start: 2017-04-13 — End: 2017-04-13
  Administered 2017-04-13: 500 mg via INTRAVENOUS
  Filled 2017-04-13: qty 100

## 2017-04-13 MED ORDER — LORAZEPAM 2 MG/ML INJECTION SOLUTION
2.0000 mg | Freq: Once | INTRAMUSCULAR | Status: AC
Start: 2017-04-13 — End: 2017-04-13
  Administered 2017-04-13: 2 mg via INTRAVENOUS

## 2017-04-13 MED ORDER — LORAZEPAM 2 MG/ML INJECTION SOLUTION
2.0000 mg | INTRAMUSCULAR | Status: DC | PRN
Start: 2017-04-13 — End: 2017-04-13
  Filled 2017-04-13: qty 1

## 2017-04-13 MED ORDER — SODIUM CHLORIDE 0.9 % INTRAVENOUS SOLUTION
2000.0000 mg | Freq: Two times a day (BID) | INTRAVENOUS | Status: DC
Start: 2017-04-13 — End: 2017-04-13
  Filled 2017-04-13 (×2): qty 20

## 2017-04-13 MED ORDER — LEVETIRACETAM 500 MG/100 ML IN SODIUM CHLORIDE (ISO-OSM) IV PIGGYBACK
500.0000 mg | INJECTION | Freq: Once | INTRAVENOUS | Status: DC
Start: 2017-04-13 — End: 2017-04-13

## 2017-04-13 MED ORDER — PANTOPRAZOLE 40 MG INTRAVENOUS SOLUTION
20.0000 mg | INTRAVENOUS | Status: DC
Start: 2017-04-13 — End: 2017-04-13

## 2017-04-13 MED ORDER — MORPHINE 4 MG/ML INTRAVENOUS CARTRIDGE
3.0000 mg | CARTRIDGE | INTRAVENOUS | Status: DC | PRN
Start: 2017-04-13 — End: 2017-04-13
  Administered 2017-04-13 (×3): 3 mg via INTRAVENOUS
  Filled 2017-04-13 (×3): qty 1

## 2017-04-13 NOTE — Nurse Focus (Signed)
Report given to Medic 89 for transport to [facility]. All belongings with patient including cell phone and cell phone charger.
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
## 2017-04-13 NOTE — Nurse Focus (Signed)
Please refer to Chloe SetaHeather, RN Focus note.   Dr. Thedore MinsSingh called at 1105 regarding seizure activity.  New order for 1X iv keppra.  Dr. Thedore MinsSingh called at 1112 regarding continuing seizure activity. Ok to give additional 1mg  ativan iv.   Transferring pt to ICU.   1200-Called report to Brett CanalesSteve, Charity fundraiserN.  Patient alert and not actively seizing but remains lethargic.   Chloe SetaHeather, RN and Fleet Contrasachel, [facility] transferred [facility] to [facility].

## 2017-04-13 NOTE — Nurse Focus (Signed)
Unless otherwise noted, the patient's status remained unchanged during shift.  Pain managed with medication available, patient able to participate in ADL's. All planned interventions and routine care were completed during the shift, and the patient/family received teaching in the language preferred with sufficient comprehension.    VSS, afebrile, no s/s acute respiratory distress.  Continues to have sz's ranging 3-4 minutes.  Pt. Awakens almost immediately post seizure, is oriented x4.

## 2017-04-13 NOTE — Nurse Procedure (Signed)
picc    MRN [id number]  Chloe Barron  DOB 07/09/1996  Gender: female      PICC PROCEDURE NOTE  Note started: 04/13/2017  17:16        * Date of service: 04/13/2017     LOS:  LOS: 1 day      Allergies: is allergic to amoxicillin; asa [aspirin]; basiliximab; betadine [povidone-iodine]; cheese; docusate sodium; doxycycline; garlic; penicillin; shellfish containing products; sweet potato; and vancomycin.    Pre Procedure Diagnosis: Inpatient: Seizure disorder [G40.909]  Severe protein-calorie malnutrition [E43]  Post Procedure Diagnosis: same  * Patient's specific hospital location during procedure: ICU [place]    *Person recording insertion practice data: Inserter   *Occupation of inserter: Marketing executive Name: Winona Legato, RN  Ordering YM:[id number]        Was inserter a member of PICC Team yes  Reason for insertion: New indication   PICC Use: IV Access and Blood Sampling    Appropriate procedural pause was taken.  See pre-procedure checklist for additional documentation.  Procedure Start Time: 1600  Procedure End Time: 44    Inserter performed hand hygiene prior to central line insertion: yes    Maximal sterile barriers used:  Skin Prep:  * Sterility: Cap, , Drape,, Gloves,, Gown,, Mask, and eye protection  * Skin Prep: Chlorhexidine Sterile Prep  *Chlorhexadine Contraindication: No  Was skin prep completely dry at time of first skin puncture yes    Modified Seldinger insertion technique was utilized. Ultrasound guidance was utilized to document selected vessel patency and concurrent ultrasound visualization of vascular needle entry into venous lumen.      * Insertion site: left basilic     Measurements:  Internal length (IL): 36  CM  External length (EL): 3  CM  Catheter Trimmed Length: 39  Total Length:55  Mid-arm circumference (MAC): 21  CM measured @ 10 CM above ACF.   Mid-arm circumference (MAC): 19.5  CM measured _0  CM below ACF.    PICC Catheter:  Type of PICC: power picc    * Lumen: Single Lumen    *Is an  antimicrobial coated catheter used?  No  * Size: 4 FR  Lot #: [id number]  Vein accessed using: 5 FR Micro Introducer with 21 Gauge IV catheter  Blood return: yes  Saline lock: yes  Tip Location:  svc/caj  Tip Placement Verified By: ECG      Post Procedure:  Estimated blood loss: minimal  Complications: None  Comments: NO BLEEDING   Post Procedure CXR: Not ordered (reason): NA  Post Procedure TCS: Ordered  Post Procedure ECG:  Ordered    Did this insertion attempt result in a successful central line placement? yes    * Data needed by Infection Control for CLIP reporting   CLIP notification required for each line note (CL,PA Cath):Send CLIP notification (CL,PA,PICC lines)      Winona Legato, RN

## 2017-04-13 NOTE — Progress Notes (Addendum)
HOSPITALIST MEDICINE DAILY PROGRESS NOTE  Date: 04/13/2017  Time:  10:01          SUBJECTIVE:  Chloe Barron is awake and alert doing good,complains of abdominal pain,unable to use jejunostomy tube,she might be able to take oral meds.    MEDICATIONS:  Scheduled Medications  Budesonide/Formoterol (SYMBICORT) 160-4.5 mcg/actuation Inhaler 2 puff, INHALATION, BID  Calcium Carbonate (TUMS) Chewable Tablet 750 mg, ORAL, QAM w/ meal  Cetirizine (ZYRTEC) Tablet 10 mg, ORAL, Daily 2100  Ciprofloxacin (CIPRO) 400 mg in D5W 200 mL IVPB, IV, Q12H Now, Last Rate: 400 mg (04/12/17 2307)  Dronabinol (MARINOL) Capsule 10 mg, ORAL, TID  [START ON 04/22/2017] Estradiol patch REMOVAL, Transdermal, Sat  [START ON 04/15/2017] Estradiol Weekly (CLIMARA) 0.1 mg/24 hr Patch 1 patch, Transdermal, Sat  FamoTIDine (PEPCID) Tablet 20 mg, ORAL, BID  Fluoxetine (PROZAC) Capsule/Tablet 60 mg, ORAL, QAM  Heparin PF 5,000 units/0.5 mL Injection 5,000 Units, SUBCUTANEOUS, Q8H (16,10,96(06,14,22)  Hydrocortisone (PF) (SOLU-CORTEF) Injection 100 mg, IV, Q8H  Lactobacillus (CULTURELLE) Sprinkle Capsule 1 Sprinkle Capsule, ORAL, BIDAC  LevETIRAcetam (KEPPRA) Tablet 2,000 mg, ORAL, BID  MedroxyPROGESTERone (PROVERA) Tablet 10 mg, ORAL, Daily 1000  Metoclopramide (REGLAN) Tablet 10 mg, ORAL, QID  Montelukast (SINGULAIR) Tablet 10 mg, ORAL, Daily 2100  nutritional supplements 0.07 gram-1.5 kcal/mL 1 Dose, J-TUBE, TID  Pantoprazole (PROTONIX) Delayed Release Tablet 20 mg, ORAL, QAM AC  Polyethylene Glycol 3350 (MIRALAX) Oral Powder Packet 17 g, ORAL, BID  Pregabalin (LYRICA) Capsule 75 mg, ORAL, TID  Saline Lock Flush 2.5 mL, IV, Q8H (04,54,09(06,14,22)  Scopolamine (TRANSDERM-SCOP) Patch 1 patch, Transdermal, Q72H Now  [START ON 04/15/2017] Scopolamine patch REMOVAL 1 patch, Transdermal, Q72H Now  Sodium Chloride Tablet 2 g, ORAL, QAM    IV Medications   PRN Medications  Acetaminophen (TYLENOL) Tablet 650 mg, ORAL, Q6H PRN  Albuterol (PROAIR HFA, PROVENTIL HFA, VENTOLIN HFA) Inhaler  1-2 puff, INHALATION, Q4H PRN  Albuterol (PROVENTIL, VENTOLIN) 2.5 mg/0.5 mL Solution for Nebulization 2.5 mg, NEBULIZATION, Q6H PRN  Bisacodyl (DULCOLAX) Suppository 10 mg, RECTALLY, Q24H PRN  Diazepam (VALIUM) Tablet 5 mg, ORAL, Q6H PRN  Ibuprofen (CHILDREN'S MOTRIN, ADVIL) 100 mg/5 mL Suspension 600 mg, ORAL, Q6H PRN  Lidocaine (XYLOCAINE) 10 mg/mL (1%) Injection 0.2 mL, INTRADERMAL, PRN  Lorazepam (ATIVAN) Injection 1 mg, IV, Code/Trauma/Sedation PRN  Lorazepam (ATIVAN) Tablet 2 mg, ORAL, Q8H PRN  Magnesium Hydroxide (MILK OF MAGNESIA) 400 mg/5 mL Suspension 30 mL, ORAL, Q24H PRN  Melatonin Tablet 9 mg, ORAL, Bedtime PRN  Morphine Injection 1 mg, IV, Q2H PRN  Morphine Injection 3 mg, IV, Q1H PRN  Naloxone (NARCAN) Injection 0.08 mg, IV, Q2MIN PRN  Naloxone (NARCAN) Injection 0.2 mg, IV, PRN  Ondansetron (ZOFRAN) Injection 4 mg, IV, Q8H PRN  Oxycodone (ROXICODONE) Tablet 10 mg, ORAL, Q6H PRN  Promethazine (PHENERGAN) Injection 6.25 mg, IV, Q4H PRN  Promethazine (PHENERGAN) Tablet 25 mg, ORAL, Q8H PRN  Saline Lock Flush 2.5 mL, IV, PRN  Sumatriptan (IMITREX) Tablet 50 mg, ORAL, PRN  Tramadol (ULTRAM) Tablet 50 mg, ORAL, Q8H PRN      OBJECTIVE:   Vital Signs  Summary  Temp Min: 35.9 C (96.6 F) Max: 36.6 C (97.9 F)  BP: (91-109)/(44-67)   Pulse Min: 59 Max: 85  Resp Min: 12 Max: 18  SpO2 Min: 93 % Max: 100 %      Current Vitals  Temp: 36.3 C (97.3 F)  BP: 101/56  Pulse: 63  Resp: 18  SpO2: 100 %  Flow (L/min): 2   Weight:  43.6 kg (96 lb 1.9 oz)     Intake and Output  Last Two Completed Shifts  In: 1418 [Crystalloid:896]  Out: 2505 [Urine:2500; Other:5]    Current Shift       Physical Exam  General Appearance: awake alert,  No distress  Eyes: conjunctivae and corneas clear. PERRL, EOM's intact. sclerae normal.   Ears: not examined.   Nose: not examined .  Mouth: not examined.   Neck: Neck supple. No adenopathy, thyroid symmetric, normal size.   Heart: normal rate and regular rhythm, no murmurs,  clicks, or gallops.   Lungs: clear.   Abdomen: BS normal.  Abdomen soft, non-tender.  No masses or organomegaly.   Extremities: no cyanosis, clubbing, or edema.   Skin: Skin color, texture, turgor normal. No rashes or lesions.   Rectal: not examined.   Neuro: no gross defecits.   Mental Status:awake,alert oriented times 3.  Musculoskeletal: not examined.        IMAGING:   [facility] Ct Head Without Contrast    Result Date: 04/12/2017  Technique: Noncontrast head CT from base of skull to vertex. Following helical acquisition, transaxial slices and sagittal and coronal reformatted images were obtained. INDICATIONS: Episodes of seizure.  Comparison: None. Findings: Examination is somewhat limited by artifacts from the scanner table. There is no evidence of intracranial hemorrhage, gross acute infarct, mass, mass effect, edema, or abnormal extra-axial fluid collection. Brain parenchyma appears normal. The skull is intact. Ventricles, sulci, and cisterns are all within normal limits. Visualized portions of the paranasal sinuses, mastoid air cells, and middle ear airspaces are essentially clear. Orbits and extracranial soft tissues are unremarkable.     Impression: No evidence of intracranial hemorrhage, mass, mass effect, edema, or other acute intracranial disease process. Normal examination. Preliminary report was provided by Dr. Heron Nay to the emergency department, and there is no discrepancy. Radiation Dose Indicators: CTDI 45.2 mGy, DLP 937 mGy-cm    [facility] Ct Cervical Spine    Result Date: 04/12/2017  Technique: Contiguous thin section axial images were obtained through the cervical spine, followed by multiplanar image reconstructions. INDICATIONS: Seizures. Comparison: None. Findings: There is no evidence of fracture or subluxation. Cervical spine alignment is essentially normal, except for straightening of cervical lordosis and a slight rightward tilt and rotation, which are probably secondary to patient positioning.  Visualized vertebrae all appear normal. Facet joints are also intact and appear well aligned. No gross disc herniation or significant disc bulge is seen. Central canal and neural foramina appear patent throughout. Paravertebral soft tissues are unremarkable.     Impression: 1. No evidence of fracture or subluxation of the cervical spine. Essentially normal examination. 2. Preliminary report was provided by Dr. Heron Nay to the emergency department, and there is no discrepancy. Radiation Dose Indicators: CTDI 7.1 mGy, DLP 170 mGy-cm      LABS:  Lab Results - 24 hours (excluding micro and POC)   BASIC METABOLIC PANEL     Status: Abnormal   Result Value Status    Sodium 140 Final    Potassium 3.8 Final    Chloride 107 Final    Carbon Dioxide Total 25 Final    Calcium 9.0 Final    Glucose 132 (H) Final    Urea Nitrogen, Blood (BUN) 3 (L) Final    Creatinine Serum 0.58 Final    BUN/ Creatinine 5.2 (L) Final    E-GFR 134 Final    E-GFR, Non-African American 134 Final   CBC WITH DIFFERENTIAL  Status: Abnormal   Result Value Status    White Blood Cell Count 6.3 Final    Red Blood Cell Count 3.65 (L) Final    Hemoglobin 11.7 (L) Final    Hematocrit 34.2 (L) Final    MCV 93.7 Final    MCH 32.1 Final    MCHC 34.2 Final    RDW 13.2 Final    MPV 9.1 (L) Final    Platelet Count 343 Final    Neutrophils % Auto 88.6 (H) Final    Lymphocytes % Auto 9.8 Final    Monocytes % Auto 0.9 Final    Eosinophil % Auto 0.0 Final    Basophils % Auto 0.2 Final    Immature Granulocytes % 0.50 Final    Neutrophil Abs Auto 5.61 (H) Final    Lymphocyte Abs Auto 0.6 (L) Final    Monocytes Abs Auto 0.1 (L) Final    Eosinophil Abs Auto 0.0 Final    Basophils Abs Auto 0.0 Final    Nucleated RBC % Auto 0.0 Final    NRBC Abs Auto 0.0 Final    Immature Granulocytes Abs Auto 0.0 Final         ASSESSMENT & PLAN:         Patient Active Hospital Problem List:  Severe protein-calorie malnutrition (04/12/2017)    Assessment:jejunostomy tube placed pod #1      Plan: appreciate gastroenterology and surgery consult  seizures  oral keppra started  uti  Culture pending,on cipro  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

## 2017-04-13 NOTE — Discharge Summary (Signed)
TRANSFER SUMMARY  PATIENT NAME:Deleeuw, Lashona               ADMIT DATE: 04/11/2017  DOB:Feb 25, 1996                          DISCHARGE DATE:  AGE:21                                  DATE OF VISIT:04/13/2017  REFERRING PHYSICIAN:  ,      TRANSFER DIAGNOSIS:    1. Status epilepticus.     2. 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     3. 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   4. Cortisol deficient.   5. Migraines.   6. Conductive hearing loss.   7. History of gastroparesis.   8. Gastroesophageal reflux disease.   9. Peptic ulcer disease.   10. Lyme disease.   11. Osteoporosis.   12. Ovarian failure.   13. Pancreatitis.   14. Postural orthostatic tachycardia syndrome.   15. Posttraumatic stress disorder.   16. Anxiety.   17. Depression.   18. Conversion disorder.   19. 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   20. Chronic pain syndrome.   21. Severe malnutrition.    HOSPITAL COURSE:    Seizure activity.  Refer to the H and P dictated by Dr. Eloisa Northern.       Lizmarie is a 21 year old female, very complicated past medical history, who came to [facility] after she was recently discharged from [facility] in [place], [place].  She was hospitalized from 8/5 through 8/10.  Patient has inability to   tolerate p.o. and weight loss because of superior mesenteric artery syndrome.  Patient was scheduled to have a jejunostomy placed, but she had to travel urgently for a family funeral.  The patient is dependent on nasogastric tube for her medications and   diet and nutrition.  Patient dislodged the nasogastric tube or a nasojejunal tube after she was swimming in the river.  She started having seizures.  Patient was hospitalized.  She had 4-7 seizures, given Versed.  Patient underwent placement of J-tube   after the NG tube was dislodged by Dr. Hebert Soho on 04/12/2017.  Postop, patient is having lot of seizures in spite of giving  her IV Keppra at 2000 IV b.i.d.     Patient's workup has been reviewed from [place].  There is some history of possible seizures.  EEG was negative as per records.  The patient was transferred to the ICU because of status epilepticus.  Since we do not have a neurologist, Dr. Mitchel Honour, the   ICU specialist is recommending transfer to a different facility with Neurology.  Patient has been given multiple doses of Ativan.  Laboratory data is unremarkable.    PHYSICAL EXAMINATION:    This morning she was very awake and alert and oriented x3.     GENERAL:  Young female, looks older than her age.  Looks malnourished, cachectic.   VITAL SIGNS:  Temp 97.2, heart rate is 58, blood pressure is 102/65, respiratory rate 20, saturations are 99%.   HEENT:  Normal.  JVP is flat.   LUNGS:  Clear.   CARDIAC:  S1, S2.  Regular rhythm.  No murmurs, gallops.   ABDOMEN:  Soft, nontender to palpation.  No hepatosplenomegaly.  No guarding or  rebound.     EXTREMITIES:  No edema.   NEURO:  No gross deficits.    LABORATORY DATA:    Chem 8 is normal.  CBC is within normal limits.  Urine culture is normal.  Skin genital flora.  CT scan of the head without contrast is no acute hemorrhage, mass effect, edema.  CT cervical spine, no evidence of fracture, subluxation of cervical spine.    Urine cannabinoids positive.  Urine PCP is negative.  Urine tricyclics are negative.    CONSULTATION:    1. Dr. Mitchel Honourirumala.   2. Dr. Hebert Sohohayer, surgery.   3. Dr. Macy MisSchneiderman, gastroenterology.    PROCEDURE DONE:    Placement of a jejunostomy feeding tube laparoscopically on 04/12/2017.    PERTINENT MEDICATIONS:    On discharge,   1. Tylenol 650 q.6h p.r.n.   2. ProAir inhaler 1-2 puffs q.4 p.r.n.   3. Albuterol nebulizer 2.5 q.6h p.r.n.   4. Dulcolax suppository p.r.n.   5. Symbicort 160/4.5 mcg 2 puffs twice a day.   6. Tamsulosin 50 once a day.     7. Zyrtec 10 mg once a day.     8. Cipro 500 every 12 hours.     9. Valium 5 mg every 6 hours.     10. Marinol 10 mg 3  times a day.     11. Estradiol patch.   12. Climara 1 patch once a week.     13. Pepcid 20 mg twice a day.     14. Prozac 60 mg once a day.   15. Heparin 5000 units subcu t.i.d.   16. Ibuprofen, children's Motrin 600 mg q.6 p.r.n.   17. Culturelle 1 capsule twice a day.     18. Keppra is 2000 mg IV q.12 hours.   19. Ativan 2 mg IV q.15 minutes p.r.n. seizures.   20. Progesterone 10 mg once a month.   21. Melatonin 10 mg once a day.     22. Reglan 10 mg 4 times a day.     23. Singular 10 mg once a day.     24. Morphine p.r.n.   25. Naloxone p.r.n.   26. Roxicodone 10 mg q.6 p.r.n.   27. Protonix 20 mg once a day.   28. Zofran 4 mg IV q.8 hours.    DIET:    Patient is getting IV fluids as we are not able to use the jejunostomy tube.    DISCHARGE CONDITION:    Stable.        Electronically Signed by Marius DitchAmrit P. Thedore MinsSingh, MD on 05/07/2017 18:40:12  Sherril Heyward P. Thedore MinsSingh, MD    Marlene Bastavid J. Schneiderman, MD     Odella Aquasraig R. Hebert Sohohayer, MD     Venkat R. Mitchel Honourirumala, MD  DI: APS  DD: 04/13/2017 16:15:58           JOB: [id number]  DT: 04/13/2017 16:51:28  DICT ID [id number]  MT: SG  NO PCP PER PATIENT    Miguel Christiana P. Thedore MinsSingh, MD                  PATIENT NAMECorky Downs: Mcdonell, Sheri                                      [id number]  CSN-VISIT ID: [id number]                                      DATE: 04/13/2017                                      PATIENT ROOM #: [id number]                                      TRANSFER SUMMARY                            Page  4 of 4

## 2017-04-13 NOTE — Nurse Focus (Signed)
Report given to Howard PouchMarilyn RN at [facility]

## 2017-04-13 NOTE — Nurse Focus (Signed)
Seizure activity noted yesterday day shift. Tube feeding started at 10:30 NS 6330ml/hr. Arrived in room at 10:49 with patient actively seizing until 10:51 with active shaking, eyes rolled back, tight fists, then visible relaxation noted marking the end of seizing activity a pause then seizing began again from 10:52 to 10:59 and 10:59 to 11:04 IV ativan given with visible relaxation. Seizures started again at 11:09 to 11:11 patient became verbal complained of pain near Surgical site. Seizure 11:14 to 11:17 IV ativan given again. At 11:21 vs stable. Seizures started again at 11:35 to 11:37 and 11:44 to 11:45

## 2017-04-13 NOTE — Consults (Signed)
CONSULTATION  PATIENT NAME:Chloe Barron, Chloe Barron               ADMIT DATE: 04/11/2017  DOB:April 25, 1996                          DISCHARGE DATE:  AGE:21                                  DATE OF VISIT:04/13/2017  REFERRING PHYSICIAN:  Merrily Brittle, MD      REASON FOR CONSULTATION:    Respiratory distress and uncontrolled seizure with history of epilepsy.    CHIEF COMPLAINT:    See HPI.    HISTORY OF PRESENT ILLNESS:    The patient is a pleasant 21 year old female with significant past medical history of multiple comorbidities including reported asthma, history of bone marrow transplant with graft-versus-host disease, gastroparesis, chronic malnutrition, reported   history of Lyme disease, history of seizure with history of epilepsy who was admitted to [facility] on 15th of August 2018 when she had presented with persistent seizures.  It was reported the patient was recently discharged from [facility]   of [facility] in [place], where she was admitted between August 5th and 10th.  She was traveling across the country to attend a funeral in the family in [place] and the patient was noted to have seizure activity that started insidiously.  The   patient was noted to have persistent seizures and was brought to the ER.       In the ER, the patient was noted to have continued seizure activity, but she was able to protect her airway.  Also noted was her chronic NG tube had dislodged.  She was admitted to inpatient service and underwent a G-tube placement by Dr. Hebert Soho.  The   patient was noted to have persistent seizure activity despite her Keppra and benzodiazepines and was transferred to ICU for further management.  The patient is currently in the ICU and Pulmonary/Critical Care consultation is requested for further   recommendations.  The patient was noted to have a grand mal seizure while I was at her bedside during the interview and the patient was unable to give any pertinent history.  There was no  preceding triggers and the patient was able to protect her airway   during the episode.  There was no loss of bowel or bladder activity.  However, the patient's seizure activity was consistent with a grand mal episode.  She had back-to-back seizures while I was at her bedside and each of them lasted more than a minute.    The patient had postictal state.  No other history is obtainable from the patient.  I have reviewed the patient's medical records and please see her admitting records for further details.    REVIEW OF SYSTEMS:    A complete review of 14 systems was attempted and is unobtainable.     Past medical history, surgical history, social history, allergies, and medications were reviewed.    PHYSICAL EXAMINATION:    VITAL SIGNS:  Her most recent vital signs show blood pressure 105/56 mmHg, pulse 68, respiration 20, saturation 100% on 2 L/minute via nasal cannula oxygen.   HEENT:  Pupils are reactive to light.  Mucosa appeared mildly dry.   NECK:  There is no tender lymphadenopathy.     CHEST:  Good air entry with no adventitious sounds.  ABDOMEN:  Soft with no peritoneal signs.   GENITOURINARY:  No suprapubic tenderness to palpation.   CNS:  No apparent focal deficits.   EXTREMITIES:  Peripheral pulses are palpable.  Patient has features of chronic malnourishment.    LABORATORY DATA:    Reviewed.    IMAGING DATA:    Reviewed.    CURRENT MEDICATIONS:    Reviewed.    ASSESSMENT AND PLAN:    1. Respiratory system:  The patient does not have any acute respiratory issues.  She should be monitored closely in the ICU as she has status epilepticus.  If the patient were to have further episodes of seizure activity and she does not have ability to   protect her airway, she should be electively intubated and placed on mechanical ventilation for airway protection.  She does not have any evidence of pneumonia at this time.  We will continue to wean the patient from respiratory support as per   respiratory therapy  protocol.   2. Cardiovascular system:  The patient appears to be hemodynamically stable.  We will continue close monitoring.   3. Gastrointestinal:  History of chronic malnutrition with chronic nutrition through a jejunostomy tube.  The patient had a G-tube placed surgically by Dr. Hebert Soho today and she seems to be tolerating this well.  We will resume nutrition via G-tube as per   the recommendations by the surgeon and the nutritionist.  We will keep her n.p.o. at this time due to high risk for aspiration during her seizure activity.  The patient is on gastrointestinal prophylaxis with famotidine.   4. Renal:  No current acute issues.  Continue close monitoring.   5. Central nervous system:  History of chronic seizure activity with history of epilepsy and nonepileptic seizures.  The patient has witnessed status epilepticus during her hospitalization at this time and she is not responding to her current regimen of   medications.  Per my discussion with her hospitalist physician, it appears that the patient's baseline Keppra dosing has been increased to 2 g b.i.d.  We will continue her on p.r.n. benzodiazepines.  The patient may need an additional 2nd or even a 3rd   antiseizure medications in order to control her persistent seizures due to epilepsy.  The patient will need a neurological evaluation by a neurologist and we unfortunately do not have such coverage at [facility] at present.  The patient should be   transferred to a tertiary center where she can get the care that she needs with the tertiary services.  I have discussed this with Dr. Merrily Brittle and the plan would be to transfer the patient to a neurological unit or hospital that has neurology   services on board.   6. Infectious disease:  No current acute issues.  Continue close monitoring.  We will check pancultures if the patient were to develop any fevers or leukocytosis.  We will deescalate therapy based on lab results.   7. Fluid and electrolytes:  No  current acute issues noted.  On replacement protocol.  Continue close monitoring.   8. ICU prophylaxis:  The patient is on stress ulcer/gastroesophageal reflux disease treatment.  Her deep venous thrombosis prophylaxis is with heparin subcutaneously.  She is on aspiration and line infection prevention protocol.    CODE STATUS:    FULL CODE.     The patient is critically ill and overall her prognosis remains guarded.     I will defer the management of her comorbidities to the  hospitalist physician and other consultants.     Thank you for allowing me to participate in the care of this patient.  Please do not hesitate to contact me if any questions or concerns arise.  I have discussed the above plan of management with her hospitalist physician, Dr. Merrily BrittleAmrit Singh and also the   ICU team.    TOTAL CRITICAL CARE TIME SPENT:    Was 35 minutes excluding any procedures performed.        Electronically Signed by Audry PiliVenkat R. Mitchel Honourirumala, MD on 04/23/2017 15:55:00  Syrita Dovel R. Mitchel Honourirumala, MD      DI: VRT  DD: 04/13/2017 13:54:57           JOB: [id number]  DT: 04/13/2017 14:19:59  DICT ID [id number]  MT: SD  NO PCP PER PATIENT    Krissia Schreier R. Mitchel Honourirumala, MD              PATIENT NAMCorky Downs: Harms, Kasy                                      [id number]                                      CSN-VISIT ID: [id number]                                      DATE: 04/13/2017                                      PATIENT ROOM #: [id number]                                      CONSULTATION                            Page  3 of 3

## 2017-04-13 NOTE — Plan of Care (Signed)
Problem: Patient Care Overview (Adult)  Goal: Plan of Care Review  Outcome: Ongoing (interventions implemented as appropriate)   04/12/17 2200   OTHER   Plan Of Care Reviewed With patient     Goal: Individualization and Mutuality  Outcome: Ongoing (interventions implemented as appropriate)   04/12/17 0651   Mutuality/Individual Preferences   What Anxieties, Fears or Concerns Do You Have About Your Health or Care? none stated   What Questions Do You Have About Your Health or Care? none stated    Individualization   Patient Specific Goals Get well and go home    Patient Specific Interventions pain medication and seizure precautions      Goal: Discharge Needs Assessment  Outcome: Ongoing (interventions implemented as appropriate)   04/12/17 0657   Current Health   Outpatient/Agency/Support Group Needs none   Anticipated Changes Related to Illness none   Activity/Self Care Review of Systems   Equipment Currently Used at Home none   Living Environment   Transportation Available none       Problem: Sleep Pattern Disturbance (Adult)  Goal: Identify Related Risk Factors and Signs and Symptoms  Related risk factors and signs and symptoms are identified upon initiation of Human Response Clinical Practice Guideline (CPG)   Outcome: Ongoing (interventions implemented as appropriate)   04/12/17 0657   Sleep Pattern Disturbance   Sleep Pattern Disturbance: Related Risk Factors hospital routine/care     Goal: Adequate Sleep/Rest  Patient will demonstrate the desired outcomes by discharge/transition of care.   Outcome: Ongoing (interventions implemented as appropriate)   04/13/17 0008   Sleep Pattern Disturbance (Adult)   Adequate Sleep/Rest making progress toward outcome       Problem: Pain, Acute (Adult)  Goal: Identify Related Risk Factors and Signs and Symptoms  Related risk factors and signs and symptoms are identified upon initiation of Human Response Clinical Practice Guideline (CPG)   Outcome: Ongoing (interventions  implemented as appropriate)   04/12/17 0657   Pain, Acute   Related Risk Factors (Acute Pain) (Fall at home )   Signs and Symptoms (Acute Pain) verbalization of pain descriptors     Goal: Acceptable Pain Control/Comfort Level  Patient will demonstrate the desired outcomes by discharge/transition of care.   Outcome: Ongoing (interventions implemented as appropriate)   04/13/17 0008   Pain, Acute (Adult)   Acceptable Pain Control/Comfort Level making progress toward outcome       Problem: Nutrition, Imbalanced: Inadequate Oral Intake (Adult)  Goal: Identify Related Risk Factors and Signs and Symptoms  Related risk factors and signs and symptoms are identified upon initiation of Human Response Clinical Practice Guideline (CPG)   Outcome: Ongoing (interventions implemented as appropriate)   04/12/17 0657   Nutrition, Imbalanced: Inadequate Oral Intake   Nutrition Imbalanced: Less than Body Requirements: Related Risk Factors chronic illness/infection   Signs and Symptoms (Nutrition Imbalance, Inadequate Oral Intake: Signs and Symptoms) (nausea )     Goal: Improved Oral Intake  Patient will demonstrate the desired outcomes by discharge/transition of care.   Outcome: Ongoing (interventions implemented as appropriate)   04/13/17 0008   Nutrition, Imbalanced: Inadequate Oral Intake (Adult)   Improved Oral Intake making progress toward outcome     Goal: Prevent Further Weight Loss  Patient will demonstrate the desired outcomes by discharge/transition of care.   Outcome: Ongoing (interventions implemented as appropriate)   04/13/17 0008   Nutrition, Imbalanced: Inadequate Oral Intake (Adult)   Prevent Further Weight Loss making progress toward outcome  Problem: Seizure Disorder/Epilepsy (Adult)  Goal: Signs and Symptoms of Listed Potential Problems Will be Absent or Manageable (Seizure Disorder/Epilepsy)  Signs and symptoms of listed potential problems will be absent or manageable by discharge/transition of care  (reference Seizure Disorder/Epilepsy (Adult) CPG).   Outcome: Ongoing (interventions implemented as appropriate)   04/12/17 0657   Seizure Disorder/Epilepsy   Problems Assessed (Seizure Disorder/Epilepsy) all   Problems Present (Seizure Disorder/Epilepsy) none

## 2017-04-13 NOTE — Nurse Focus (Signed)
End of Shift Statement      Patient Progress per diagnosis: Patient progressing well post J tube placement yesterday. Vitals stable. Alert and oriented x 4. No seizure activity noted. Keppra and antibiotics received as ordered. Surgical site dressing CDI. Zofran and phenergan effective at relieving nausea. Morphine given for complaints of abdominal pain. Effective at providing relief. Urine output good. Slept well throughout the night.     Significant events: None     Pain reported during shift: Moderate  Action taken: non-pharmacologic interventions were implemented and morphine administered and reassessed in the appropriate timeframe for the medication given  Reassessment: patient comfortable and reports medication effective in relieving pain.  Patient was able to rest comfortably  Side Effects: none  Education provided on medication morphine      Georgann HousekeeperLisa M Tiffini Blacksher, RN

## 2017-04-13 NOTE — Nurse Focus (Addendum)
12:00 Report received from Sarah,RN, Pt. To transfer to ICU bed 6    12:20  Arrived from to ICU via hospital bed accompanied by RN, and ACA.  Pt. Is Alert, Oriented x's 4.  No s/s acute distress noted.  J-Tube intact, drsg. Is CLEAN DRY AND INTACT.    1600  PICC RN at the bedside to place PICC.

## 2017-04-13 NOTE — Nurse Focus (Signed)
Seizure activity noted yesterday day shift. Tube feeding started at 10:30 NS 8230ml/hr. Seizures began at 10:49 to 10:51 with active shaking, eyes rolled back, fists, then visible relaxation noted marking the end of seizing activity a pause then began again from 10:52 to 10:59 and 10:59 to 11:04 IV IV ativan given with visible relaxation. Seizures started again at 11:09 to 11:11 when verbal patient complains of pain near Surgical site. Seizure 11:14 to 11:17 IV ativan given again. At 11:21 vs stable. Seizures started again at 11:35 to 11:37 and 11:44 to 11:45

## 2017-04-13 NOTE — Consults (Signed)
Name: Chloe Barron  Date of birth: Apr 20, 1996  Age: 21yr  Sex: female    Date of consultation: 04/13/17  Reason for consultation: Respiratory distress and uncontrolled seizure with history of epilepsy.  Consulted by: Dr. 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  It was reported the patient was recently discharged from [facility]   of [facility] in [place], where she was admitted between August 5th and 10th.  She was traveling across the country to attend a funeral in the family in [place] and the patient was noted to have seizure activity that started insidiously.  The   patient was noted to have persistent seizures and was brought to the ER.       In the ER, the patient was noted to have continued seizure activity, but she was able to protect her airway.  Also noted was her chronic NG tube had dislodged.  She was admitted to inpatient service and underwent a G-tube placement by Dr. Hebert Soho.  The   patient was noted to have persistent seizure activity despite her Keppra and benzodiazepines and was transferred to ICU for further management.  The patient is currently in the ICU and Pulmonary/Critical Care consultation is requested for further   recommendations.  The patient was noted to have a grand mal seizure while I was at her bedside during the interview and the patient was unable to give any pertinent history.  There was no preceding triggers and the patient was able to protect her airway   during the episode.  There was no loss of bowel or bladder activity.  However, the patient's  seizure activity was consistent with a grand mal episode.  She had back-to-back seizures while I was at her bedside and each of them lasted more than a minute.    The patient had postictal state.  No other history is obtainable from the patient.  I have reviewed the patient's medical records and please see her admitting records for further details.    REVIEW OF SYSTEMS:    A complete review of 14 systems was attempted and is unobtainable.     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    Hypertension 04/11/2017    Seizure disorder 04/11/2017       Past Surgical History:  Past Surgical History:   Procedure Laterality Date    INSERTION, TYMPANOSTOMY TUBE  05/26/2014       Allergies:    Amoxicillin    Hives  Asa [Aspirin]    Unknown-Explain in Comments    Comment:bleeding  Basiliximab    Angioedema  Betadine [Povidone-Iodine]    Hives  Cheese    Nausea/Vomiting  Docusate Sodium    Anaphylaxis  Doxycycline    Rash  Garlic    Anaphylaxis  Penicillin    Hives  Shellfish Containing Products    Anaphylaxis  Sweet Potato    Hives  Vancomycin    Rash    Social History:  Social History     Social History  Marital status: SINGLE     Spouse name: N/A    Number of children: N/A    Years of education: N/A     Occupational History    Not on file.     Social History Main Topics    Smoking status: Not on file    Smokeless tobacco: Not on file    Alcohol use Not on file    Drug use: 2.00 per week     Special: Marijuana    Sexual activity: Not on file     Other Topics Concern    Not on file     Social History Narrative       Family history:  No family history on file. Unable to obtain.    Home Medications:  No current facility-administered medications on file prior to encounter.      No current outpatient prescriptions on file prior to encounter.     Reviewed per the admission note.     Current Inpatient Medications:    Current  Facility-Administered Medications:     Acetaminophen (TYLENOL) Tablet 650 mg, 650 mg, ORAL, Q6H PRN, Ranjit Jamse Belfast, MD    Albuterol (PROAIR HFA, PROVENTIL HFA, VENTOLIN HFA) Inhaler 1-2 puff, 1-2 puff, INHALATION, Q4H PRN, Ranjit Jamse Belfast, MD    Albuterol (PROVENTIL, VENTOLIN) 2.5 mg/0.5 mL Solution for Nebulization 2.5 mg, 2.5 mg, NEBULIZATION, Q6H PRN, Ranjit Jamse Belfast, MD    Bisacodyl (DULCOLAX) Suppository 10 mg, 10 mg, RECTALLY, Q24H PRN, Ranjit Jamse Belfast, MD    Budesonide/Formoterol (SYMBICORT) 160-4.5 mcg/actuation Inhaler 2 puff, 2 puff, INHALATION, BID, Ranjit Jamse Belfast, MD, 2 puff at 04/13/17 1610    Calcium Carbonate (TUMS) Chewable Tablet 750 mg, 750 mg, ORAL, QAM w/ meal, Ranjit Jamse Belfast, MD    Cetirizine (ZYRTEC) Tablet 10 mg, 10 mg, ORAL, Daily 2100, Ranjit Jamse Belfast, MD    Ciprofloxacin (CIPRO) 400 mg in D5W 200 mL IVPB, 400 mg, IV, Q12H Now, Ranjit Jamse Belfast, MD, Last Rate: 200 mL/hr at 04/12/17 2307, 400 mg at 04/12/17 2307    Diazepam (VALIUM) Tablet 5 mg, 5 mg, ORAL, Q6H PRN, Ranjit Jamse Belfast, MD    Dronabinol (MARINOL) Capsule 10 mg, 10 mg, ORAL, TID, Ranjit Jamse Belfast, MD, 10 mg at 04/13/17 1006    [START ON 04/22/2017] Estradiol patch REMOVAL, , Transdermal, Sat, Ranjit Jamse Belfast, MD    [START ON 04/15/2017] Estradiol Weekly (CLIMARA) 0.1 mg/24 hr Patch 1 patch, 1 patch, Transdermal, Sat, Ranjit Jamse Belfast, MD    FamoTIDine (PEPCID) Tablet 20 mg, 20 mg, ORAL, BID, Ranjit Jamse Belfast, MD, 20 mg at 04/13/17 1006    Fluoxetine (PROZAC) Capsule/Tablet 60 mg, 60 mg, ORAL, QAM, Ranjit Jamse Belfast, MD, 60 mg at 04/13/17 1003    Heparin PF 5,000 units/0.5 mL Injection 5,000 Units, 5,000 Units, SUBCUTANEOUS, Q8H (96,04,54), Selinda Orion, MD, 5,000 Units at 04/13/17 0543    Ibuprofen (CHILDREN'S MOTRIN, ADVIL) 100 mg/5 mL Suspension 600 mg, 600 mg, ORAL, Q6H PRN, Ranjit Jamse Belfast, MD    Lactobacillus (CULTURELLE) Sprinkle Capsule 1 Sprinkle Capsule,  1 Sprinkle Capsule, ORAL, BIDAC, Ranjit Jamse Belfast, MD, 1 Sprinkle Capsule at 04/12/17 0635    LevETIRAcetam (KEPPRA) 2,000 mg in NaCl 0.9% 100 mL IVPB, 2,000 mg, IV, Q12H Now, Amrit Burney Gauze, MD    LevETIRAcetam (KEPPRA) 500 mg in NaCl (iso-osm) 100 mL IVPB, 500 mg, IV, ONCE, Adeeb Konecny R Everlynn Sagun, MD    Lidocaine (XYLOCAINE) 10 mg/mL (1%) Injection 0.2 mL, 0.2 mL,  INTRADERMAL, PRN, Ranjit Jamse Belfast, MD    Lorazepam (ATIVAN) Injection 2 mg, 2 mg, IV, ONCE, Kiara Mcdowell R Kais Monje, MD    Lorazepam (ATIVAN) Injection 2 mg, 2 mg, IV, Q15MIN PRN, Suzzette Righter, MD    Lorazepam (ATIVAN) Tablet 2 mg, 2 mg, ORAL, Q8H PRN, Ranjit Jamse Belfast, MD, 2 mg at 04/12/17 9562    Magnesium Hydroxide (MILK OF MAGNESIA) 400 mg/5 mL Suspension 30 mL, 30 mL, ORAL, Q24H PRN, Ranjit Jamse Belfast, MD    MedroxyPROGESTERone (PROVERA) Tablet 10 mg, 10 mg, ORAL, Daily 1000, Ranjit Jamse Belfast, MD    Melatonin Tablet 9 mg, 9 mg, ORAL, Bedtime PRN, Ranjit Jamse Belfast, MD    Metoclopramide (REGLAN) Tablet 10 mg, 10 mg, ORAL, QID, Ranjit Jamse Belfast, MD, 10 mg at 04/13/17 1006    Montelukast (SINGULAIR) Tablet 10 mg, 10 mg, ORAL, Daily 2100, Ranjit Jamse Belfast, MD    Morphine Injection 1 mg, 1 mg, IV, Q2H PRN, Mable Paris, MD    Morphine Injection 3 mg, 3 mg, IV, Q1H PRN, Mable Paris, MD, 3 mg at 04/13/17 1234    Naloxone (NARCAN) Injection 0.08 mg, 0.08 mg, IV, Q2MIN PRN, Ranjit Jamse Belfast, MD    Naloxone (NARCAN) Injection 0.2 mg, 0.2 mg, IV, PRN, Ranjit Jamse Belfast, MD    nutritional supplements 0.07 gram-1.5 kcal/mL 1 Dose, 1 Dose, J-TUBE, TID, Ranjit Jamse Belfast, MD    Ondansetron (ZOFRAN) Injection 4 mg, 4 mg, IV, Q8H PRN, Ranjit Jamse Belfast, MD, 4 mg at 04/13/17 0149    Oxycodone (ROXICODONE) Tablet 10 mg, 10 mg, ORAL, Q6H PRN, Ranjit Jamse Belfast, MD, 10 mg at 04/13/17 1003    Pantoprazole (PROTONIX) Delayed Release Tablet 20 mg, 20 mg, ORAL, QAM AC, Ranjit Jamse Belfast, MD, 20 mg at 04/12/17 1308    Polyethylene  Glycol 3350 (MIRALAX) Oral Powder Packet 17 g, 17 g, ORAL, BID, Ranjit Jamse Belfast, MD    Pregabalin (LYRICA) Capsule 75 mg, 75 mg, ORAL, TID, Ranjit Jamse Belfast, MD    Promethazine (PHENERGAN) Injection 6.25 mg, 6.25 mg, IV, Q4H PRN, Ranjit Jamse Belfast, MD, 6.25 mg at 04/12/17 2349    Promethazine (PHENERGAN) Tablet 25 mg, 25 mg, ORAL, Q8H PRN, Ranjit Jamse Belfast, MD    Saline Lock Flush 2.5 mL, 2.5 mL, IV, Q8H (65,78,46), Ranjit Jamse Belfast, MD, 2.5 mL at 04/13/17 0648    Saline Lock Flush 2.5 mL, 2.5 mL, IV, PRN, Ranjit Jamse Belfast, MD    Scopolamine (TRANSDERM-SCOP) Patch 1 patch, 1 patch, Transdermal, Q72H Now, Ranjit Jamse Belfast, MD, 1 patch at 04/12/17 0638    [START ON 04/15/2017] Scopolamine patch REMOVAL 1 patch, 1 patch, Transdermal, Q72H Now, Ranjit Jamse Belfast, MD    Sodium Chloride Tablet 2 g, 2 g, ORAL, QAM, Ranjit Jamse Belfast, MD    Sumatriptan (IMITREX) Tablet 50 mg, 50 mg, ORAL, PRN, Ranjit Jamse Belfast, MD    Tramadol (ULTRAM) Tablet 50 mg, 50 mg, ORAL, Q8H PRN, Ranjit Jamse Belfast, MD, 50 mg at 04/12/17 0646    Physical Examination:    Vitals: SpO2: 100 %  Flow (L/min): 4  Pulse: 68    VITAL SIGNS:  Her most recent vital signs show blood pressure 105/56 mmHg, pulse 68, respiration 20, saturation 100% on 4 L/minute via nasal cannula oxygen.   HEENT:  Pupils are reactive to light.  Mucosa appeared mildly dry.   NECK:  There is no tender lymphadenopathy.     CHEST:  Good air  entry with no adventitious sounds.   ABDOMEN:  Soft with no peritoneal signs.   GENITOURINARY:  No suprapubic tenderness to palpation.   CNS:  No apparent focal deficits.   EXTREMITIES:  Peripheral pulses are palpable.  Patient has features of chronic malnourishment.  Labs:     CBC Recent labs for the past 48 hours     04/13/17 0430 04/12/17 0455 04/11/17 1944    WHITE BLOOD CELL COUNT 6.3 5.5 8.1    HEMOGLOBIN 11.7* 11.0* 10.6*    HEMATOCRIT 34.2* 33.1* 30.3*    PLATELET COUNT 343 300 319        COMPREHENSIVE METABOLIC  PANEL Recent labs for the past 72 hours     04/13/17 0430 04/12/17 0455 04/11/17 1944    GLUCOSE 132* 90 90    UREA NITROGEN, BLOOD (BUN) 3* 5* 8    CREATININE BLOOD 0.58 0.56 0.79    SODIUM 140 139 138    POTASSIUM 3.8 3.2* 3.6    CHLORIDE 107 109 106    CARBON DIOXIDE TOTAL 25 24 24     CALCIUM 9.0 8.3* 8.7    PROTEIN -- -- 5.7*    ALBUMIN -- -- 3.0*    BILIRUBIN TOTAL -- -- 0.3    ALKALINE PHOSPHATASE (ALP) -- -- 93    ASPARTATE TRANSAMINASE (AST) -- -- 15    ALANINE TRANSFERASE (ALT) -- -- 19      Radiology:    CT head was performed on 04/11/2017. I have reviewed the report.   Impression: No evidence of intracranial hemorrhage, mass, mass effect, edema, or other acute intracranial disease process. Normal examination.    CT cervical spine was performed on 04/11/2017. I have reviewed the report.   Impression:   1. No evidence of fracture or subluxation of the cervical spine. Essentially normal examination.  2. Preliminary report was provided by Dr. Heron NayKhalil to the emergency department, and there is no discrepancy.    Assessment and Plan:    1. Respiratory system:  - The patient does not have any acute respiratory issues.    - She should be monitored closely in the ICU as she has status epilepticus.    - If the patient were to have further episodes of seizure activity and she does not have ability to protect her airway, she should be electively intubated and placed on mechanical ventilation for airway protection.    - She does not have any evidence of pneumonia at this time.    - We will continue to wean the patient from respiratory support as per respiratory therapy protocol.     2. Cardiovascular system:  A. The patient is hemodynamically stable. We will continue to monitor closely.     3. GI system:   - History of chronic malnutrition with chronic nutrition through a jejunostomy tube.    - The patient had a G-tube placed surgically by Dr. Hebert Sohohayer today and she seems to be tolerating this well.    - We  will resume nutrition via G-tube as per  the recommendations by the surgeon and the nutritionist.    - We will keep her n.p.o. at this time due to high risk for aspiration during her seizure activity.  - The patient is on gastrointestinal prophylaxis with famotidine.     4. Renal system:  A. No acute issues  B. Continue close monitoring of urine output and labs    5. ID:  A. No acute issues  B. We will check  pan cultures if the patient were to develop any fevers or leukocytosis  C. We will de-escalate therapy based on the lab results    6. CNS:  - History of chronic seizure activity with history of epilepsy and nonepileptic seizures.    - The patient has witnessed status epilepticus during her hospitalization at this time and she is not responding to her current regimen of medications.    - Per my discussion with her hospitalist physician Dr. Merrily Brittle, it appears that the patient's baseline Keppra dosing has been increased to 2 g b.i.d, and despite this escalation of dosing, she has continued to have uncontrolled seizures.  - We will continue her on p.r.n. benzodiazepines.    - The patient may need an additional 2nd or even a 3rd antiseizure medications in order to control her persistent seizures due to epilepsy.    - The patient will need a neurological evaluation by a neurologist and we unfortunately do not have such coverage at [facility] at present.    - The patient should be transferred to a tertiary center where she can get the care that she needs with the tertiary services.    - I have discussed this with Dr. Merrily Brittle and the plan would be to transfer the patient to a neurological unit or hospital that has neurology services on board.     7. Fluid and electrolyte:  A. No acute issues  B. Continue close monitoring  C. The patient will need an IV access, we will get a PICC line placed    8. ICU Prophylaxis:  The patient is on stress ulcer/gastroesophageal reflux disease treatment.  Her deep venous  thrombosis prophylaxis is with heparin subcutaneously.  She is on aspiration and line infection prevention protocol.    9. Code status: Full code    10. The patient is critically ill and overall prognosis remains guarded.    11. I will defer the management of the patient's comorbidities to the hospitalist physician and other consultants.     Thank you for allowing me to participate in the care of this patient. Please do not hesitate to contact me if any questions arise. I have discussed the above plan of management with her hospitalist physician, Dr. Merrily Brittle and also the ICU team.    Total critical care time spent was 35 minutes excluding any procedures performed.     Marzella Schlein, MD    [facility]  [address]  [place], [place], [address]    Ph: [phone]  Pg: [phone]

## 2017-04-13 NOTE — Allied Health Progress (Addendum)
CASE MANAGEMENT DAILY NOTE      Patient: Chloe GriefSkyla Plott WUJ:[id number]   Date of Birth: 02/16/1996 Gender:female   Note Date:04/13/2017  Note time:14:14   PCP:No Pcp Per Patient      Current Medical Conditions:  Seizure disorder [G40.909]  Severe protein-calorie malnutrition [E43]    PC from Dr. Tiajuana AmassA. Singh after patient transferred to ICU requesting high level of care transfer to Neurology suggested by Dr. Mitchel Honourirumala.  Patient continues to have seizures in ICU and is on IV Keppa.    [facility] [phone].  Information supplied but bed status is very limited.    [facility] for [facility].    [facility] [phone].  Information supplied.  Faxed H&P, Facesheet to [phone] .  CM awaiting return call for confirmation of acceptance.    Update note was requested from Dr. Tiajuana AmassA. Singh.    Called patients insurance EQ [phone] requesting [facility].  Directed to website for request form, which was completed and faxed.  [facility] aware and is also continuing to obtain authorization for transfer today.  Contact information for [facility] give to ICU along with signed physician forms.    [facility].  Confirmed patient to go today.       Contacted ICU to ensure discharge summary completed by Dr. Thedore MinsSingh per [facility] request.     Junie PanningLeslie K Coree Brame, RN, BSN  Case Manager

## 2017-04-13 NOTE — Progress Notes (Signed)
GENERAL SURGERY PROGRESS NOTE    Note Date and Time: 04/13/2017 09:09 Date of Admission: 04/11/2017  7:14 PM    Chloe Barron  MRN: [id number]  Patient's PCP: No Pcp Per Patient      Summary   Chloe Barron is a 21yr old who is POD# 1 s/p laparoscopic JT placement, with gastroparesis and sma syndrome and malnutrition.    24hr Interval Events:   - The night shift did not act on the order for the NS/JT. Pt sore but no new c/o.      Vital Signs   Current Vitals  Temp: 36.3 C (97.3 F) (04/13/17 0818)  BP: 101/56 (04/13/17 0818) Pulse: 63 (04/13/17 0852)  Resp: 18 (04/13/17 0852)  SpO2: 100 % (04/13/17 0852)      Weight: 43.6 kg (96 lb 1.9 oz) (04/12/17 0404)  24 hour Summary:   Temp Min: 35.9 C (96.6 F) Max: 36.6 C (97.9 F)  BP: (91-109)/(44-67)   Pulse Min: 59 Max: 85  Resp Min: 12 Max: 18  SpO2 Min: 93 % Max: 100 %  No Data Recorded         I/O:    Intake/Output Summary (Last 24 hours) at 04/13/17 0909  Last data filed at 04/13/17 0300   Gross per 24 hour   Intake             1418 ml   Output             2505 ml   Net            -1087 ml         Physical Exam  Gen:  NAD  Card: RRR, no murmurs, gallops or rubs  Pulm: CTA BL, no wheezes, rales or rhonchi  Abd:  Soft, nontender, nondistended  Ext:  Warm and well perfursed, no clubbing, cyanosis, or edema  Wound: clear    Labs:    No results found for this basename: ARTPH:2,ARTPCO2:2,ARTPO2:2,ARTHCO3:2,ARTBE:2,ARTO2SAT:2 in the last 48 hours   Recent labs for the past 48 hours     04/13/17 0430 04/12/17 0455    SODIUM 140 139    POTASSIUM 3.8 3.2*    CHLORIDE 107 109    CARBON DIOXIDE TOTAL 25 24    UREA NITROGEN, BLOOD (BUN) 3* 5*    CREATININE BLOOD 0.58 0.56    GLUCOSE 132* 90      Recent labs for the past 48 hours     04/13/17 0430 04/12/17 0455 04/11/17 1944    WHITE BLOOD CELL COUNT 6.3 5.5 8.1    HEMOGLOBIN 11.7* 11.0* 10.6*    HEMATOCRIT 34.2* 33.1* 30.3*    PLATELET COUNT 343 300 --      Recent labs for the past 48 hours     04/12/17 0810    INR 1.06       Recent labs for the past 48 hours     04/11/17 1944    PROTEIN 5.7*    ALBUMIN 3.0*    BILIRUBIN TOTAL 0.3    ALKALINE PHOSPHATASE (ALP) 93    ASPARTATE TRANSAMINASE (AST) 15    ALANINE TRANSFERASE (ALT) 19    BILIRUBIN DIRECT --       24hr Imaging: none      A/P: Chloe Barron is a 21yr old female with  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 Start NS at 64ml/hr and if tolerated over 6 hrs change soln to half strength isocal or dietary stuff we  have. Once tolerating this, tomorrow am we can increase the rate first and then the strength so as to not overwhelm the gut. I have seen gangrene from feeding too quickly.       Odella Aquasraig R. Hebert Sohohayer MD,FACS

## 2017-04-13 NOTE — Allied Health Progress (Cosign Needed Addendum)
INITIAL NUTRITION ASSESSMENT    Reason For Assessment: New J-tube    21yr old female admitted with Seizure disorder [G40.909]  Severe protein-calorie malnutrition [E43]    Nutrition Assessment     Subjective Data:  Pt currently having recurrent seizures and being transferred to the ICU. NS running through feeding tube at 36mL/hr. RN reports it was started this am. Pt appears very thin with bony prominences.     Reported 42# wt loss over the past 6 months per diet tech screening.     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  BMI Classification: underweight  IBW: 45.5 kg    Wt History:   Wt Readings from Last 20 Encounters:   04/12/17 43.6 kg (96 lb 1.9 oz) (2 %)*     * Growth percentiles are based on CDC 2-20 Years data.     Last BM: 8/12   Skin: abd incision  Pertinent Labs: BUN/Cr 3/.058; Glu 132; K+ 3.8  Pertinent Meds: Tums, Marinol, Pepcid, Culturelle, Reglan, Zofran, nutritional supplement 0.07g-1.5 kcal/mL (not given)    8/15-Laparoscopic feeding jejunostomy placed (12FR)    Estimated Nutrition Needs: (based on 43.6 kg):  1300-1525 kcals/day (30-35 kcals/kg)  52 g pro/day (1.2 g pro/kg)  1300-1525 mL fluid/day (1 mL/kcal)    Diet Order: [facility] ADULT ENTERAL TUBE FEEDING  NPO    Average PO Intake: NPO  Average PO Fluid Intake:  NPO    Nutrition Diagnosis     21 YOF adm w/ severe  protein-calorie malnutrition. 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 H/O chronic, intermittent abd pain w/ N/V/wt loss. S/P Laparoscopic feeding jejunostomy placed (12FR) on 8/15 to bypass the duodenal obstruction. Pt currently on 30mL of NS via feeding tube. Currently having recurrent seizures. Pt w/ severe malnutrition as evidenced by a 30% wt loss over the past 6 months w/ intermittent abd pain/N/V; poor nutrition intake; bony prominences (clavicle) duodenal obstruction. Pt now w/ a feeding jejunostomy for nutrition support. Pt at risk for refeeding syndrome and would monitor labs carefully and feed slowly. Pt did have an NJ tube in place but w/ h/o wt loss and unsure of nutrition intake, pt is at risk for refeeding syndrome; would benefit from thiamine supplementation. "Nutritional supplement" from home noted on meds; call into pharmacy to discuss as I am unclear what this is and pt is unable to discuss d/t seizures.     Nutrition Intervention/Recommendations     1) Once medically stable, start TF w/ Osmolite 1.0 @ 79mL/hr; increase 10mL Q 8-12 hrs as tolerated. Final goal is 55 mL/hr to provide  1400kcals, 59 g pro (1.3 g pro/kg)  2) Monitor TPN chem panel (watch phos, Mg, K+ for refeeding)  3) 100mg  Thiamine x3d  4) Fluids per MD    Nutrition Monitoring/Goals:      Monitor: labs; TF tolerance  Nutrition Focused Goal: Tolerate nutrition support without metabolic complications    Follow-Up: 1-2 days    Burna SisLaurie J Trissa Molina, RD    Addendum: Discussed above recs with ICU.

## 2017-04-20 ENCOUNTER — Emergency Department

## 2017-04-20 ENCOUNTER — Emergency Department
Admission: AD | Admit: 2017-04-20 | Discharge: 2017-04-20 | Disposition: A | Discharge status: Home / Self Care | Attending: Emergency Medicine | Admitting: Emergency Medicine

## 2017-04-20 DIAGNOSIS — Z881 Allergy status to other antibiotic agents status: Secondary | ICD-10-CM

## 2017-04-20 DIAGNOSIS — Z880 Allergy status to penicillin: Secondary | ICD-10-CM

## 2017-04-20 DIAGNOSIS — Z91018 Allergy to other foods: Secondary | ICD-10-CM

## 2017-04-20 DIAGNOSIS — Z886 Allergy status to analgesic agent status: Secondary | ICD-10-CM

## 2017-04-20 DIAGNOSIS — T85598A Other mechanical complication of other gastrointestinal prosthetic devices, implants and grafts, initial encounter: Secondary | ICD-10-CM | POA: Insufficient documentation

## 2017-04-20 DIAGNOSIS — Z91013 Allergy to seafood: Secondary | ICD-10-CM

## 2017-04-20 DIAGNOSIS — K9423 Gastrostomy malfunction: Secondary | ICD-10-CM

## 2017-04-20 DIAGNOSIS — Z888 Allergy status to other drugs, medicaments and biological substances status: Secondary | ICD-10-CM

## 2017-04-20 DIAGNOSIS — G40909 Epilepsy, unspecified, not intractable, without status epilepticus: Secondary | ICD-10-CM | POA: Insufficient documentation

## 2017-04-20 LAB — COMPREHENSIVE METABOLIC PANEL
ALANINE TRANSFERASE (ALT): 13 U/L (ref 4–56)
ALB/GLOB RATIO_MMC: 1 (ref 1.0–1.6)
ALBUMIN: 3.5 g/dL (ref 3.2–4.7)
ASPARTATE TRANSAMINASE (AST): 19 U/L (ref 9–44)
Alkaline Phosphatase (ALP): 104 U/L (ref 38–126)
BILIRUBIN TOTAL: 0.6 mg/dL (ref 0.1–2.2)
BUN/CREATININE_MMC: 9.5 (ref 7.3–21.7)
CALCIUM: 9.7 mg/dL (ref 8.7–10.2)
CARBON DIOXIDE TOTAL: 26 mmol/L (ref 22–32)
CHLORIDE: 103 mmol/L (ref 99–109)
CREATININE BLOOD: 0.63 mg/dL (ref 0.50–1.30)
E-GFR, Non-African American: 121 mL/min/{1.73_m2} (ref >60)
E-GFR_MMC: 121 mL/min/{1.73_m2} (ref 60–?)
GLOBULIN BLOOD_MMC: 3.6 g/dL (ref 2.2–4.2)
GLUCOSE: 102 mg/dL — AB (ref 70–99)
POTASSIUM: 3.5 mmol/L (ref 3.5–5.2)
PROTEIN: 7.1 g/dL (ref 5.9–8.2)
Sodium: 141 mmol/L (ref 134–143)
UREA NITROGEN, BLOOD (BUN): 6 mg/dL (ref 6–21)

## 2017-04-20 LAB — CBC WITH DIFFERENTIAL
BASOPHILS % AUTO: 0.6 % (ref 0.0–1.0)
BASOPHILS ABS AUTO: 0.1 10*3/uL (ref 0.0–0.1)
EOSINOPHIL % AUTO: 10.9 % — AB (ref 0.0–4.0)
EOSINOPHIL ABS AUTO: 0.8 10*3/uL — AB (ref 0.0–0.2)
HEMATOCRIT: 35.5 % — AB (ref 36.0–48.0)
HEMOGLOBIN: 12.5 g/dL (ref 12.0–16.0)
IMMATURE GRANULOCYTES % AUTO: 0.4 % (ref 0.00–0.50)
IMMATURE GRANULOCYTES ABS AUTO: 0 10*3/uL (ref 0.0–0.0)
LYMPHOCYTE ABS AUTO: 1.9 10*3/uL (ref 1.3–2.9)
LYMPHOCYTES % AUTO: 24.6 % (ref 5.0–41.0)
MCH: 31.6 pg (ref 27.0–34.0)
MCHC G/DL: 35.2 g/dL (ref 33.0–37.0)
MCV: 89.9 fL (ref 82.0–97.0)
MONOCYTES ABS AUTO: 0.5 10*3/uL (ref 0.3–0.8)
MPV: 9.3 fL — AB (ref 9.4–12.4)
Monocytes % Auto: 6.3 % (ref 0.0–10.0)
NEUTROPHIL ABS AUTO: 4.42 10*3/uL (ref 2.20–4.80)
NEUTROPHILS % AUTO: 57.2 % (ref 45.0–75.0)
NUCLEATED CELL COUNT: 0 10*3/uL (ref 0.0–0.1)
NUCLEATED RBC/100 WBC: 0 %{WBCs} (ref <=0.0)
PLATELET COUNT: 295 10*3/uL (ref 151–365)
RDW: 12.9 % (ref 11.5–14.5)
RED CELL COUNT: 3.95 10*6/uL (ref 3.80–5.10)
White Blood Cell Count: 7.7 10*3/uL (ref 4.2–10.8)

## 2017-04-20 LAB — URINALYSIS-COMPLETE
BILIRUBIN URINE: NEGATIVE
GLUCOSE URINE: NEGATIVE mg/dL
KETONES_MMC: 5 — AB
LEUK. ESTERASE: NEGATIVE
NITRITE URINE: NEGATIVE
PH URINE: 6 (ref 5.0–9.0)
PROTEIN URINE: 30 mg/dL — AB
RBC: 3 /HPF (ref <=3)
SPECIFIC GRAVITY URINE MMC: 1.02 (ref 1.002–1.030)
SQUAMOUS EPI: 5
UROBILINOGEN.: NEGATIVE mg/dL
WBC: 1 /HPF (ref <5)

## 2017-04-20 LAB — MMC PERIPHERAL SMEAR REVIEW: PLATELET ESTIMATE, SMEAR: ADEQUATE

## 2017-04-20 LAB — VALPROATE

## 2017-04-20 MED ORDER — PROMETHAZINE 25 MG/ML INJECTION SOLUTION
12.5000 mg | Freq: Once | INTRAMUSCULAR | Status: AC
Start: 2017-04-20 — End: 2017-04-20
  Administered 2017-04-20: 12.5 mg via INTRAVENOUS
  Filled 2017-04-20: qty 1

## 2017-04-20 MED ORDER — VALPROATE SODIUM 500 MG/5 ML (100 MG/ML) INTRAVENOUS SOLUTION
500.0000 mg | Freq: Once | INTRAVENOUS | Status: AC
Start: 2017-04-20 — End: 2017-04-20
  Administered 2017-04-20: 500 mg via INTRAVENOUS
  Filled 2017-04-20: qty 5

## 2017-04-20 MED ORDER — NACL 0.9% IV BOLUS - DURATION REQ
500.0000 mL | Freq: Once | INTRAVENOUS | Status: AC
Start: 2017-04-20 — End: 2017-04-20
  Administered 2017-04-20: 500 mL via INTRAVENOUS

## 2017-04-20 MED ORDER — LORAZEPAM 2 MG/ML INJECTION SOLUTION
0.5000 mg | Freq: Once | INTRAMUSCULAR | Status: AC
Start: 2017-04-20 — End: 2017-04-20
  Administered 2017-04-20: 0.5 mg via INTRAVENOUS
  Filled 2017-04-20: qty 1

## 2017-04-20 NOTE — ED Nursing Note (Signed)
Patient placed on bedpan urine specimen collected.

## 2017-04-20 NOTE — ED Nursing Note (Signed)
Patient discharged from ED with AVS, Rx, related instructions and all belongings. Patient is in NAD, is awake/alert skin, is pink/warm/dry. Pt leaves the ED wheelchair with family. Pt verbalized all understanding, states no further questions, assisted to vehicle via w/c

## 2017-04-20 NOTE — ED Triage Note (Signed)
Pt here for possible peg tube displacement, pt states she had an asthma attack this morning and thinks with all the forceful coughing she may have dislodged it. Pt states it started hurting around the peg tube site and oozing blood.   Pt was recently in the hospital for seizures.

## 2017-04-20 NOTE — ED Nursing Note (Signed)
Pt having frequent seizures, precautions maintained, family at bedside, MD notified

## 2017-04-20 NOTE — ED Initial Note (Signed)
Chief Complaint   Patient presents with    G-Tube Dislodged       HPI: Chloe Barron is a 21yrfemale who presents with initial complaint that there was a possible problem with her recently placed feeding tube.  She was complaining some pain and discomfort to the region.  However while the patient was out in the lobby she had an acute seizure.  She does have a history of seizure disorder.  [facility]  She is not sure what new medications they start around.  She was previously on Keppra.  Patient denies any pain or discomfort from the seizure.  She has had no recent illnesses otherwise.  No fever.  No vomiting.  No headache.    Review of Systems - Positive and pertinent negatives as mentioned above in the history of present illness. 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 I went over Careli's EEG results with her. I explained to her that since nonepileptic seizures are not coming from activity from the brain, that we believe that they are "stress-induced." The patient was receptive and has been seen by psychiatry. It was recommended that she switch from Keppra to Depakote for better mood stabilization which I have done.     She continues to have events, but states that this is  her baseline and she would still like to go home. From my standpoint, she can be discharged today or tomorrow as this is her baseline chronic condition. In the meantime, would check her blood pressure if she has another syncopal episode.     She will need to establish care with a PMD to get a referral to see a neurologist. I would check a Valproic acid level in one week.     WSherri Rad M.D.    Neurology     Past Surgical History:   Procedure Laterality Date    INSERTION, TYMPANOSTOMY TUBE  05/26/2014       No family history on file.    Social History     Social History    Marital status: SINGLE     Spouse name: N/A    Number of children: N/A    Years of education: N/A     Occupational History    Not on file.     Social History Main Topics    Smoking status: Not on file    Smokeless tobacco: Not on file    Alcohol use Not on file    Drug use: 2.00 per week     Special: Marijuana    Sexual activity: Not on file     Other Topics Concern    Not on file     Social History Narrative  Allergies   Allergen Reactions    Amoxicillin Hives    Asa [Aspirin] Unknown-Explain in Comments     bleeding    Basiliximab Angioedema    Betadine [Povidone-Iodine] Hives    Cheese Nausea/Vomiting    Docusate Sodium Anaphylaxis    Doxycycline Rash    Garlic Anaphylaxis    Penicillin Hives    Shellfish Containing Products Anaphylaxis    Sweet Potato Hives    Vancomycin Rash       No current facility-administered medications for this encounter.     Current Outpatient Prescriptions:     acetaminophen (TYLENOL) 325 mg Capsule, Take 650 mg by mouth once daily if needed. Indications: Pain        , Disp: , Rfl:     Albuterol (PROAIR HFA, PROVENTIL HFA, VENTOLIN HFA) 90 mcg/actuation inhaler, Take 1-2 puffs by inhalation every 4 to 6 hours if needed., Disp: , Rfl:     Albuterol (PROVENTIL, VENTOLIN) 2.5 mg /3 mL (0.083 %) nebulization, Use 2.5 mg in nebulizer every 6 hours if needed for wheezing., Disp: , Rfl:      Calcium Carbonate (TUMS-EX) 727m Chewable Tablet, Chew and swallow 750 mg by mouth every morning with a meal. Indications: Heartburn        , Disp: , Rfl:     Cetirizine (ZYRTEC) 10 mg Tablet, Take 10 mg by mouth every evening., Disp: , Rfl:     Diazepam (VALIUM) 5 mg Tablet, Take 5 mg by mouth As Directed. TAKE 5 MG IF SEIZURE LASTS AT LEAST 5 OR MORE MINUTES., Disp: , Rfl:     DiphenhydrAMINE (BENADRYL) 50 mg tablet, Take 50 mg by mouth once daily if needed. Indications: allergic reaction, Nausea        , Disp: , Rfl:     Dronabinol (MARINOL) 10 mg Capsule, Take 10 mg by mouth 3 times daily., Disp: , Rfl:     Epinephrine, Anaphylaxis, 0.3 mg/0.3 mL Injection, Inject 0.3 mg into the muscle one time if needed for allergic reaction., Disp: , Rfl:     Esomeprazole (NEXIUM) 20 mg Delayed Release Capsule, Take 20 mg by mouth every morning before a meal., Disp: , Rfl:     Estradiol Weekly (ESTRADIOL TRANSDERMAL PATCH) 0.1 mg/24 hr Patch, Apply 1 patch to the skin every Saturday., Disp: , Rfl:     FLUoxetine 60 mg Tablet, Take 60 mg by mouth every morning., Disp: , Rfl:     food supplemt, lactose-reduced (BOOST PLUS) 0.06 gram- 1.5 kcal/mL Liquid, Take 1 can by mouth 3 times daily with meals., Disp: , Rfl:     HydrOXYzine (ATARAX) 25 mg Tablet as Hydrochloride, Take 25 mg by mouth every 6 hours if needed., Disp: , Rfl:     Ibuprofen (CHILDREN'S MOTRIN, ADVIL) 100 mg/5 mL Liquid, Take 600 mg by mouth three times daily if needed for pain., Disp: , Rfl:     LevETIRAcetam (KEPPRA) 100 mg/mL Liquid, Take 15 mL by mouth 2 times daily., Disp: , Rfl:     Lorazepam (ATIVAN) 2 mg Tablet, Take 2 mg by mouth every 8 hours if needed for anxiety., Disp: , Rfl:     MedroxyPROGESTERone (PROVERA) 10 mg Tablet, Take 10 mg by mouth As Directed. 10 MG FOR 10 DAYS EVERY 3-4 MONTHS, Disp: , Rfl:     melatonin 10 mg Tablet, Take 1 tablet by mouth every day at bedtime if needed., Disp: , Rfl:     methylprednisolone sod succ  (SOLU-MEDROL INJ), Inject  50 mg into the muscle once daily if needed (iLLNESS OR TRAUMA)., Disp: , Rfl:     Metoclopramide (REGLAN) 10 mg Tablet, Take 10 mg by mouth 4 times daily., Disp: , Rfl:     Miscellaneous Medication, Take 1-2 mL by mouth 2 times daily. Indications: CBD oil, Disp: , Rfl:     Mometasone-Formoterol (DULERA) 200-5 mcg/actuation HFA Aerosol Inhaler, Take 2 puffs by inhalation 2 times daily., Disp: , Rfl:     Montelukast (SINGULAIR) 10 mg Tablet, Take 10 mg by mouth every evening., Disp: , Rfl:     multivit with iron,hematinic (COMPLETE PO), Take 1 Dose by jejeunostomy tube 3 times daily.        , Disp: , Rfl:     nutritional supplements (NUTREN 1.5) 0.07 gram-1.5 kcal/mL Liquid, Take 1 Dose by jejeunostomy tube 3 times daily.        , Disp: , Rfl:     Ondansetron (ZOFRAN-ODT) 8 mg disintegrating tablet, Take 8 mg by mouth every 8 hours if needed for nausea/vomiting., Disp: , Rfl:     Oxycodone 10 mg Tablet, Take 10 mg by mouth every 6 hours if needed., Disp: , Rfl:     Polyethylene Glycol 3350 (MIRALAX) 17 gram Powder, Take 17 g by mouth 2 times daily., Disp: , Rfl:     Pregabalin (LYRICA) 75 mg capsule, Take 75 mg by mouth 3 times daily., Disp: , Rfl:     Promethazine (PHENERGAN) 25 mg Tablet, Take 25 mg by mouth every 8 hours if needed for nausea/vomiting., Disp: , Rfl:     Ranitidine (ZANTAC) 150 mg Tablet, Take 150 mg by mouth 2 times daily., Disp: , Rfl:     Rizatriptan (MAXALT) 10 mg Tablet, Take 10 mg by mouth if needed. Indications: Migraine        , Disp: , Rfl:     Scopolamine (TRANSDERM-SCOP) 1 mg over 3 days Patch, Apply 1 patch to the skin As Directed. 1 PATCH EVERY 72 HOURS AS NEEDED, Disp: , Rfl:     Sodium Chloride 1 gram tablet, Take 2 g by mouth every morning., Disp: , Rfl:     Sumatriptan (IMITREX) 25 mg Tablet, Take 25 mg by mouth one time if needed for migraine headache., Disp: , Rfl:     Tramadol (ULTRAM) 50 mg Tablet, Take 50 mg by mouth every 8 hours if  needed for pain., Disp: , Rfl:     Physical Exam    BP 91/68  Pulse 74  Temp 36.3 C (97.3 F) (Temporal)  Resp 16  Ht 1.524 m (5')  Wt 35.4 kg (78 lb)  SpO2 98%  BMI 15.23 kg/m2    Constitutional: Very thin underdeveloped young female in mild distress.    Head: Atraumatic. Normocephalic.    Eyes: PERRL. EOMI. Conjunctivae are pink. Sclerae are anicteric.    ENT: Airway is patent. Oropharynx is clear and symmetric. Mucous membranes are moist.    Neck: No appreciable lymphadenopathy. Neck is supple without meningismus. No JVD.    Cardiovascular: Regular rate. Regular rhythm. No murmurs, rubs, or gallops. Distal pulses are equal.    Pulmonary/Chest: No evidence of respiratory distress. Patient is speaking in full sentences without accessory muscle usage. Clear to auscultation bilaterally. No wheezing, rales or rhonchi. Chest is non-tender.    Abdominal: Soft and non-distended. There is mild mid abdominal tenderness.  This is primarily at the site of the J-tube insertion.  I do not see any significant drainage.  There  is some dried blood.  No rebound, guarding, or rigidity. No organomegaly. No pulsatile mass.    GU: N/a    Back: No CVA tenderness.    Extremities: Full range of motion in all extremities. No peripheral edema.. No cyanosis.    Skin: Skin is warm and dry. No diaphoresis. No rashes.    Neurological: Alert, awake, and appropriate. No facial asymmetry. Normal speech. No acute focal neurological deficits are appreciated.    Psychiatric: Good eye contact. Normal affect and behavior.    Labs:    Results for orders placed or performed during the hospital encounter of 04/20/17   CBC WITH DIFFERENTIAL     Status: Abnormal   Result Value Status    White Blood Cell Count 7.7 Final    Red Blood Cell Count 3.95 Final    Hemoglobin 12.5 Final    Hematocrit 35.5 (L) Final    MCV 89.9 Final    MCH 31.6 Final    MCHC 35.2 Final    RDW 12.9 Final    MPV 9.3 (L) Final    Platelet Count 295 Final    Neutrophils % Auto  57.2 Final    Lymphocytes % Auto 24.6 Final    Monocytes % Auto 6.3 Final    Eosinophil % Auto 10.9 (H) Final    Basophils % Auto 0.6 Final    Immature Granulocytes % 0.40 Final    Neutrophil Abs Auto 4.42 Final    Lymphocyte Abs Auto 1.9 Final    Monocytes Abs Auto 0.5 Final    Eosinophil Abs Auto 0.8 (H) Final    Basophils Abs Auto 0.1 Final    Nucleated RBC % Auto 0.0 Final    NRBC Abs Auto 0.0 Final    Immature Granulocytes Abs Auto 0.0 Final   COMPREHENSIVE METABOLIC PANEL     Status: Abnormal   Result Value Status    Sodium 141 Final    Potassium 3.5 Final    Chloride 103 Final    Carbon Dioxide Total 26 Final    Urea Nitrogen, Blood (BUN) 6 Final    Glucose 102 (H) Final    Calcium 9.7 Final    Protein 7.1 Final    Albumin 3.5 Final    Alkaline Phosphatase (ALP) 104 Final    Aspartate Transaminase (AST) 19 Final    Bilirubin Total 0.6 Final    Alanine Transferase (ALT) 13 Final    Creatinine Serum 0.63 Final    Globulin 3.6 Final    Alb/Glob Ratio 1.0 Final    BUN/ Creatinine 9.5 Final    E-GFR 121 Final    E-GFR, Non-African American 121 Final   URINALYSIS-COMPLETE     Status: Abnormal   Result Value Status    Color Yellow Final    Clarity Cloudy (Abnl) Final    Specific Gravity  1.020 Final    pH Urine 6.0 Final    Protein 30 (Abnl) Final    Glucose Negative Final    Ketones 5  (Abnl) Final    Bilirubin Negative Final    Urobilinogen Negative Final    Occult Blood  Small (Abnl) Final    Leukocyte Esterase Negative Final    Nitrite Negative Final    RBC 3 Final    WBC 1 Final    Squamous Epithelial Cells 5 Final    Mucous Few Final    Calcium Oxalate Crystals Many (Abnl) Final    Amorphous Crystals Few Final   VALPROATE  Status: Abnormal   Result Value Status    Valproate <10.0 (L) Final       Labs Reviewed   CBC WITH DIFFERENTIAL - Abnormal; Notable for the following:        Result Value    Hematocrit 35.5 (*)     MPV 9.3 (*)     Eosinophil % Auto 10.9 (*)     Eosinophil Abs Auto 0.8 (*)     All other  components within normal limits   COMPREHENSIVE METABOLIC PANEL - Abnormal; Notable for the following:     Glucose 102 (*)     All other components within normal limits    Narrative:     eGFR results >60 is indicative of normal kidney function.    **Stage 3 CKD   GFR 30-59  **Stage 4 CKD   GFR 15-29  **Stage 5 CKD   GFR <15**    The eGFR is an estimate of a patient's kidney function; it is most useful when it is in a relatively steady state and can be used to stage Chronic Kidney Disease. It may not be appropriate to adjust medication dosing. Consult your Pharmacist. The eGFR may not be accurate for patients with extremes in body mass, muscle mass or nutritional status.    eGFR calculation is based on the (MDRD) equation validated only in Caucasians and African-Americans 11-76 years of age. If patient's race is not indicated and patient is African-American multiply the result by 1.21.    The eGfR may not be accurate for patients with extremes in body mass,muscle mass or nutritional status. The calculation may not be appropriate for medication adjustments in patients with renal impairment (consult Pharmacist).   URINALYSIS-COMPLETE - Abnormal; Notable for the following:     Clarity Cloudy (*)     Protein 30 (*)     Ketones 5  (*)     Occult Blood  Small (*)     Calcium Oxalate Crystals Many (*)     All other components within normal limits   VALPROATE - Abnormal; Notable for the following:     Valproate <10.0 (*)     All other components within normal limits   POC PREGNANCY SPOT URINE (UPT)   [facility] PERIPHERAL SMEAR REVIEW       Dx Abdomen 1 View    Result Date: 04/20/2017  Clinical history: Possible J-tube dislodgment KUB Single supine view of the abdomen was performed following injection of contrast into the J-tube. The contrast appears intraluminal but the balloon portion of the tube tip appears to lie outside the margins of the bowel. The only way to confirm confidently that the tube is in place is to perform a  noncontrast CT of the abdomen.     IMPRESSION: 1. Injected contrast through the J-tube appears to be intraluminal within small bowel. However, the balloon portion of the catheter appears to lie outside the bowel. A CT scan is recommended for confirmation before feeding. Discussed with Dr. Lanice Shirts            MDM: Patient presents at this time with altered level of consciousness status post a seizure.  In addition she is complaining of possible problems with her feeding tube.. The differential diagnosis includes acute infectious causes, electrolyte or renal disorders, glycemic abnormalities, traumatic causes, drug or alcohol intoxication, psychiatric disorders, as well as medication reactions.  In addition the concern for possible dislodgment of a J- tube.  Obstruction was considered but unlikely.  The patient  undergoes evaluation here and the diagnostic data are reviewed patient did have intermittent seizures while here.  However she never had any significant postictal phase.  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  I did give her a small dose of Ativan and I have also loaded her with Depakote.  After the IV Dilaudid Depakote she seem to be stable and no further seizure activity noted.  Additionally regarding the feeding tube there was concerned that the contrast may have been out of the bowel.  At this time a CT scan was ordered per the recommendation of radiology.  That as noted per radiology reveals a jejunal tube appeared to be positioned within the small bowel at this time in the left upper quadrant.  Upon reexamination patient appears comfortable no distress.  She is also noted of urinary tract infection for which she is currently on antibiotic.  She is discharged in stable condition at this time.      Clinical Impression:   1. Seizure disorder    2. Feeding tube dysfunction, initial encounter        The following medications were  prescribed    Discharge Medication List as of 04/20/2017  6:43 PM          Comment: Please note this record has been produced using speech recognition system and may contain errors related to that system including errors in grammar, punctuation, and spelling, as well as words and phrases that may be inappropriate. If there are any questions or concerns please feel free to contact the dictating provider for clarification.

## 2017-04-20 NOTE — ED Nursing Note (Signed)
Stitch removed from left hand which was previously placed by hospital in [place] 1 month ago, dressing changed around J tube, awaiting D/C home

## 2017-04-21 ENCOUNTER — Emergency Department: Payer: MEDICAID

## 2017-04-21 ENCOUNTER — Inpatient Hospital Stay
Admission: EM | Admit: 2017-04-21 | Discharge: 2017-04-22 | DRG: 100 | Disposition: A | Discharge status: Home / Self Care | Payer: MEDICAID | Attending: Neurology | Admitting: Neurology

## 2017-04-21 ENCOUNTER — Emergency Department (EMERGENCY_DEPARTMENT_HOSPITAL): Payer: MEDICAID

## 2017-04-21 ENCOUNTER — Encounter: Payer: Self-pay | Admitting: Student in an Organized Health Care Education/Training Program

## 2017-04-21 DIAGNOSIS — T8600 Unspecified complication of bone marrow transplant: Secondary | ICD-10-CM | POA: Diagnosis present

## 2017-04-21 DIAGNOSIS — Z934 Other artificial openings of gastrointestinal tract status: Secondary | ICD-10-CM | POA: Insufficient documentation

## 2017-04-21 DIAGNOSIS — Z68.41 Body mass index (BMI) pediatric, less than 5th percentile for age: Secondary | ICD-10-CM | POA: Insufficient documentation

## 2017-04-21 DIAGNOSIS — F54 Psychological and behavioral factors associated with disorders or diseases classified elsewhere: Secondary | ICD-10-CM | POA: Diagnosis present

## 2017-04-21 DIAGNOSIS — D6101 Constitutional (pure) red blood cell aplasia: Secondary | ICD-10-CM | POA: Diagnosis present

## 2017-04-21 DIAGNOSIS — D89811 Chronic graft-versus-host disease: Secondary | ICD-10-CM | POA: Diagnosis present

## 2017-04-21 DIAGNOSIS — K3184 Gastroparesis: Secondary | ICD-10-CM | POA: Diagnosis present

## 2017-04-21 DIAGNOSIS — Y833 Surgical operation with formation of external stoma as the cause of abnormal reaction of the patient, or of later complication, without mention of misadventure at the time of the procedure: Secondary | ICD-10-CM | POA: Diagnosis present

## 2017-04-21 DIAGNOSIS — E7220 Disorder of urea cycle metabolism, unspecified: Secondary | ICD-10-CM | POA: Diagnosis present

## 2017-04-21 DIAGNOSIS — E43 Unspecified severe protein-calorie malnutrition: Secondary | ICD-10-CM | POA: Diagnosis present

## 2017-04-21 DIAGNOSIS — E349 Endocrine disorder, unspecified: Secondary | ICD-10-CM

## 2017-04-21 DIAGNOSIS — T426X5A Adverse effect of other antiepileptic and sedative-hypnotic drugs, initial encounter: Secondary | ICD-10-CM | POA: Diagnosis present

## 2017-04-21 DIAGNOSIS — Z7952 Long term (current) use of systemic steroids: Secondary | ICD-10-CM

## 2017-04-21 DIAGNOSIS — I10 Essential (primary) hypertension: Secondary | ICD-10-CM | POA: Diagnosis present

## 2017-04-21 DIAGNOSIS — R569 Unspecified convulsions: Principal | ICD-10-CM | POA: Diagnosis present

## 2017-04-21 DIAGNOSIS — E876 Hypokalemia: Secondary | ICD-10-CM | POA: Diagnosis present

## 2017-04-21 DIAGNOSIS — K9423 Gastrostomy malfunction: Secondary | ICD-10-CM

## 2017-04-21 DIAGNOSIS — Z9114 Patient's other noncompliance with medication regimen: Secondary | ICD-10-CM | POA: Insufficient documentation

## 2017-04-21 DIAGNOSIS — R000 Tachycardia, unspecified: Secondary | ICD-10-CM | POA: Diagnosis present

## 2017-04-21 DIAGNOSIS — Y92019 Unspecified place in single-family (private) house as the place of occurrence of the external cause: Secondary | ICD-10-CM | POA: Insufficient documentation

## 2017-04-21 DIAGNOSIS — Z931 Gastrostomy status: Secondary | ICD-10-CM

## 2017-04-21 DIAGNOSIS — G40909 Epilepsy, unspecified, not intractable, without status epilepticus: Secondary | ICD-10-CM

## 2017-04-21 DIAGNOSIS — G901 Familial dysautonomia [Riley-Day]: Secondary | ICD-10-CM

## 2017-04-21 DIAGNOSIS — K551 Chronic vascular disorders of intestine: Secondary | ICD-10-CM | POA: Diagnosis present

## 2017-04-21 DIAGNOSIS — G40901 Epilepsy, unspecified, not intractable, with status epilepticus: Secondary | ICD-10-CM | POA: Insufficient documentation

## 2017-04-21 DIAGNOSIS — E46 Unspecified protein-calorie malnutrition: Secondary | ICD-10-CM | POA: Diagnosis present

## 2017-04-21 DIAGNOSIS — Z4682 Encounter for fitting and adjustment of non-vascular catheter: Secondary | ICD-10-CM

## 2017-04-21 HISTORY — DX: Conductive hearing loss, unspecified: H90.2

## 2017-04-21 HISTORY — DX: Graft-versus-host disease, unspecified: D89.813

## 2017-04-21 HISTORY — DX: Tachycardia, unspecified: R00.0

## 2017-04-21 HISTORY — DX: Postural orthostatic tachycardia syndrome (POTS): G90.A

## 2017-04-21 HISTORY — DX: Gastroparesis: K31.84

## 2017-04-21 HISTORY — DX: Other specified cardiac arrhythmias: I49.8

## 2017-04-21 HISTORY — DX: Chronic vascular disorders of intestine: K55.1

## 2017-04-21 HISTORY — DX: Other primary ovarian failure: E28.39

## 2017-04-21 HISTORY — DX: Post-traumatic stress disorder, unspecified: F43.10

## 2017-04-21 HISTORY — DX: Orthostatic hypotension: I95.1

## 2017-04-21 HISTORY — DX: Essential (primary) hypertension: I10

## 2017-04-21 HISTORY — DX: Conversion disorder with seizures or convulsions: F44.5

## 2017-04-21 HISTORY — DX: Unspecified protein-calorie malnutrition: E46

## 2017-04-21 HISTORY — DX: Epilepsy, unspecified, not intractable, without status epilepticus: G40.909

## 2017-04-21 HISTORY — DX: Age-related osteoporosis without current pathological fracture: M81.0

## 2017-04-21 HISTORY — DX: Other chronic pain: G89.29

## 2017-04-21 LAB — CBC WITH DIFFERENTIAL
BASOPHILS % AUTO: 0.9 %
Basophils Abs Auto: 0.1 10*3/uL (ref 0.0–0.2)
EOSINOPHIL % AUTO: 9.4 %
EOSINOPHIL ABS AUTO: 0.8 10*3/uL — AB (ref 0.0–0.5)
HEMATOCRIT: 33.9 % — AB (ref 36.0–46.0)
HEMOGLOBIN: 12 g/dL (ref 12.0–16.0)
LYMPHOCYTES % AUTO: 21.4 %
Lymphocytes Abs Auto: 1.7 10*3/uL (ref 1.2–5.2)
MCH: 32.6 pg (ref 27.0–33.0)
MCHC: 35.5 % (ref 32.0–36.0)
MCV: 91.7 UM3 (ref 80.0–100.0)
MONOCYTES % AUTO: 5.3 %
MONOCYTES ABS AUTO: 0.4 10*3/uL (ref 0.1–0.8)
MPV: 7.2 UM3 (ref 6.8–10.0)
NEUTROPHIL ABS AUTO: 5.1 10*3/uL (ref 1.8–8.0)
NEUTROPHILS % AUTO: 63 %
PLATELET COUNT: 224 10*3/uL (ref 130–400)
RDW: 13.8 % (ref 0.0–14.7)
RED CELL COUNT: 3.69 10*6/uL — AB (ref 4.10–5.10)
WHITE BLOOD CELL COUNT: 8.1 10*3/uL (ref 4.5–13.0)

## 2017-04-21 LAB — HEPATIC FUNCTION PANEL
Alanine Transferase (ALT): 16 U/L (ref 5–54)
Albumin: 3.2 g/dL — ABNORMAL LOW (ref 3.4–4.8)
Alkaline Phosphatase (ALP): 93 U/L (ref 30–130)
Aspartate Transaminase (AST): 21 U/L (ref 15–43)
Bilirubin Direct: 0.1 mg/dL (ref 0.0–0.2)
Bilirubin Total: 0.5 mg/dL (ref 0.3–1.3)
Protein: 6 g/dL — ABNORMAL LOW (ref 6.3–8.3)

## 2017-04-21 LAB — BLD GAS VENOUS
Base Excess, Ven: 2 meq/L (ref ?–2)
HCO3, Ven: 27 meq/L (ref 20–28)
O2 Sat, Ven: 90 % (ref 70–100)
PCO2, Ven: 48 mmHg (ref 35–50)
PO2, Ven: 60 mmHg — ABNORMAL HIGH (ref 30–55)
pH, VEN: 7.37 (ref 7.30–7.40)

## 2017-04-21 LAB — ETHANOL, PLASMA: ETHANOL, PLASMA: NEGATIVE mg/dL

## 2017-04-21 LAB — BASIC METABOLIC PANEL
CALCIUM: 8.7 mg/dL (ref 8.6–10.5)
CARBON DIOXIDE TOTAL: 24 mmol/L (ref 24–32)
CHLORIDE: 104 mmol/L (ref 95–110)
CREATININE BLOOD: 0.56 mg/dL (ref 0.44–1.27)
GLUCOSE: 90 mg/dL (ref 70–99)
POTASSIUM: 3.1 mmol/L — AB (ref 3.3–5.0)
SODIUM: 138 mmol/L (ref 135–145)
UREA NITROGEN, BLOOD (BUN): 4 mg/dL — AB (ref 8–22)

## 2017-04-21 LAB — AMMONIA: AMMONIA: 36 umol/L — AB (ref 2–30)

## 2017-04-21 LAB — LACTIC ACID, WHOLE BLD VENOUS: Lactic Acid, Whole Bld Venous: 1.5 mmol/L (ref 0.9–1.7)

## 2017-04-21 LAB — PHOSPHORUS (PO4): Phosphorus (PO4): 3.5 mg/dL (ref 2.4–5.0)

## 2017-04-21 LAB — MAGNESIUM (MG): Magnesium (Mg): 1.6 mg/dL (ref 1.5–2.6)

## 2017-04-21 LAB — LIPASE: Lipase: 23 U/L (ref 13–51)

## 2017-04-21 MED ORDER — LORAZEPAM 2 MG/ML INJECTION SOLUTION
INTRAMUSCULAR | Status: AC
Start: 2017-04-21 — End: 2017-04-21
  Filled 2017-04-21: qty 1

## 2017-04-21 MED ORDER — PROPOFOL 10 MG/ML INTRAVENOUS EMULSION
5.0000 ug/kg/min | INTRAVENOUS | Status: DC
Start: 2017-04-21 — End: 2017-04-21
  Administered 2017-04-21: 20 ug/kg/min via INTRAVENOUS

## 2017-04-21 MED ORDER — MIDAZOLAM (PF) 1 MG/ML INJECTION SOLUTION
1.0000 mg | Freq: Once | INTRAMUSCULAR | Status: DC
Start: 2017-04-21 — End: 2017-04-21

## 2017-04-21 MED ORDER — FENTANYL (PF) 50 MCG/ML INJECTION SOLUTION
INTRAMUSCULAR | Status: AC
Start: 2017-04-21 — End: 2017-04-21
  Filled 2017-04-21: qty 2

## 2017-04-21 MED ORDER — FENTANYL (PF) 10 MCG/ML IN 0.9 % SODIUM CHLORIDE INTRAVENOUS
5.0000 ug/h | INTRAVENOUS | Status: DC
Start: 2017-04-21 — End: 2017-04-21

## 2017-04-21 MED ORDER — LORAZEPAM 2 MG/ML INJECTION SOLUTION
2.0000 mg | Freq: Once | INTRAMUSCULAR | Status: AC
Start: 2017-04-21 — End: 2017-04-21
  Administered 2017-04-21: 2 mg via INTRAVENOUS
  Filled 2017-04-21: qty 1

## 2017-04-21 MED ORDER — LEVETIRACETAM 500 MG/100 ML IN SODIUM CHLORIDE (ISO-OSM) IV PIGGYBACK
500.0000 mg | INJECTION | Freq: Two times a day (BID) | INTRAVENOUS | Status: DC
Start: 2017-04-22 — End: 2017-04-22
  Administered 2017-04-22: 500 mg via INTRAVENOUS
  Filled 2017-04-21: qty 100

## 2017-04-21 MED ORDER — MIDAZOLAM (PF) 1 MG/ML INJECTION SOLUTION
2.0000 mg | INTRAMUSCULAR | Status: DC | PRN
Start: 2017-04-21 — End: 2017-04-21

## 2017-04-21 MED ORDER — DIVALPROEX 500 MG TABLET,DELAYED RELEASE
500.0000 mg | DELAYED_RELEASE_TABLET | Freq: Two times a day (BID) | ORAL | Status: DC
Start: 2017-04-21 — End: 2017-04-21

## 2017-04-21 MED ORDER — D5 / 0.45% NACL IV INFUSION
INTRAVENOUS | Status: DC
Start: 2017-04-21 — End: 2017-04-22
  Administered 2017-04-21 – 2017-04-22 (×2): via INTRAVENOUS

## 2017-04-21 MED ORDER — LORAZEPAM 2 MG/ML INJECTION SOLUTION
2.0000 mg | INTRAMUSCULAR | Status: DC | PRN
Start: 2017-04-21 — End: 2017-04-21
  Filled 2017-04-21: qty 1

## 2017-04-21 MED ORDER — VALPROATE SODIUM 500 MG/5 ML (100 MG/ML) INTRAVENOUS SOLUTION
500.0000 mg | Freq: Once | INTRAVENOUS | Status: DC
Start: 2017-04-21 — End: 2017-04-21
  Filled 2017-04-21: qty 5

## 2017-04-21 MED ORDER — VALPROATE SODIUM 500 MG/5 ML (100 MG/ML) INTRAVENOUS SOLUTION
500.0000 mg | Freq: Once | INTRAVENOUS | Status: AC
Start: 2017-04-21 — End: 2017-04-21
  Administered 2017-04-21: 500 mg via INTRAVENOUS
  Filled 2017-04-21: qty 5

## 2017-04-21 MED ORDER — FENTANYL (PF) 50 MCG/ML INJECTION SOLUTION
50.0000 ug | INTRAMUSCULAR | Status: DC | PRN
Start: 2017-04-21 — End: 2017-04-22
  Administered 2017-04-21: 50 ug via INTRAVENOUS
  Filled 2017-04-21: qty 2

## 2017-04-21 MED ORDER — ACETAMINOPHEN 650 MG/20.3 ML ORAL SOLUTION
650.0000 mg | ORAL | Status: DC | PRN
Start: 2017-04-21 — End: 2017-04-22
  Administered 2017-04-22: 650 mg via JEJUNOSTOMY
  Filled 2017-04-21: qty 20.3

## 2017-04-21 MED ORDER — LEVETIRACETAM 500 MG TABLET
1000.0000 mg | ORAL_TABLET | Freq: Once | ORAL | Status: DC
Start: 2017-04-21 — End: 2017-04-21

## 2017-04-21 MED ORDER — LORAZEPAM 2 MG/ML INJECTION SOLUTION
2.0000 mg | INTRAMUSCULAR | Status: DC | PRN
Start: 2017-04-21 — End: 2017-04-21
  Administered 2017-04-21: 2 mg via INTRAVENOUS

## 2017-04-21 MED ORDER — PROPOFOL 10 MG/ML INTRAVENOUS EMULSION
INTRAVENOUS | Status: AC
Start: 2017-04-21 — End: 2017-04-21
  Filled 2017-04-21: qty 100

## 2017-04-21 MED ORDER — DIVALPROEX 500 MG TABLET,DELAYED RELEASE
500.0000 mg | DELAYED_RELEASE_TABLET | Freq: Once | ORAL | Status: DC
Start: 2017-04-21 — End: 2017-04-21

## 2017-04-21 MED ORDER — VALPROATE SODIUM 500 MG/5 ML (100 MG/ML) INTRAVENOUS SOLUTION
500.0000 mg | Freq: Two times a day (BID) | INTRAVENOUS | Status: DC
Start: 2017-04-22 — End: 2017-04-22
  Administered 2017-04-22: 500 mg via INTRAVENOUS
  Filled 2017-04-21 (×2): qty 5

## 2017-04-21 MED ORDER — LORAZEPAM 2 MG/ML INJECTION SOLUTION
1.0000 mg | Freq: Once | INTRAMUSCULAR | Status: AC
Start: 2017-04-21 — End: 2017-04-21
  Administered 2017-04-21: 2 mg via INTRAVENOUS

## 2017-04-21 MED ORDER — POTASSIUM CHLORIDE 10 MEQ/100ML IN STERILE WATER INTRAVENOUS PIGGYBACK
10.0000 meq | INJECTION | INTRAVENOUS | Status: DC | PRN
Start: 2017-04-21 — End: 2017-04-22
  Administered 2017-04-21 – 2017-04-22 (×4): 10 meq via INTRAVENOUS
  Filled 2017-04-21 (×2): qty 200

## 2017-04-21 MED ORDER — LEVETIRACETAM 500 MG/100 ML IN SODIUM CHLORIDE (ISO-OSM) IV PIGGYBACK
500.0000 mg | INJECTION | Freq: Two times a day (BID) | INTRAVENOUS | Status: DC
Start: 2017-04-22 — End: 2017-04-21
  Filled 2017-04-21: qty 100

## 2017-04-21 MED ORDER — HEPARIN, PORCINE (PF) 5,000 UNIT/0.5 ML INJECTION SYRINGE
5000.0000 [IU] | INJECTION | Freq: Two times a day (BID) | INTRAMUSCULAR | Status: DC
Start: 2017-04-21 — End: 2017-04-22
  Administered 2017-04-22: 5000 [IU] via SUBCUTANEOUS
  Filled 2017-04-21: qty 1

## 2017-04-21 MED ORDER — MAGNESIUM SULFATE 2 GRAM/50 ML (4 %) IN WATER INTRAVENOUS PIGGYBACK
2.0000 g | INJECTION | INTRAVENOUS | Status: DC | PRN
Start: 2017-04-21 — End: 2017-04-22
  Administered 2017-04-22: 2 g via INTRAVENOUS
  Filled 2017-04-21: qty 50

## 2017-04-21 MED ORDER — LEVETIRACETAM 1,000 MG/100 ML IN SODIUM CHLORIDE(ISO-OSM) IV PIGGYBACK
1000.0000 mg | INJECTION | Freq: Once | INTRAVENOUS | Status: AC
Start: 2017-04-21 — End: 2017-04-21
  Administered 2017-04-21: 1000 mg via INTRAVENOUS
  Filled 2017-04-21: qty 100

## 2017-04-21 MED ORDER — FENTANYL (PF) 50 MCG/ML INJECTION SOLUTION
50.0000 ug | INTRAMUSCULAR | Status: DC
Start: 2017-04-21 — End: 2017-04-21
  Administered 2017-04-21: 50 ug via INTRAVENOUS

## 2017-04-21 NOTE — Nurse Focus (Signed)
Notified Dr Charise Killian around 2225 that patient had two seizures a few minutes apart for a total of 1 min and 30 seconds . Seizure was characterized with rolling of eyes, unresponsive with fine tremors. 02 sat normal. V/s within normal limits. Right after I talked to Dr Copper , patient had another seizure. Ativan 2 mg IV given. She was able to protect her airway with the three episodes.

## 2017-04-21 NOTE — ED Nursing Note (Signed)
MICU team in at bedside
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
## 2017-04-21 NOTE — ED Nursing Note (Signed)
Report received from Kim, RN. Care assumed.
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
## 2017-04-21 NOTE — ED Nursing Note (Signed)
Pt with another witnessed seizure lasting approximately 1 minute. Dr. Faylene Million at bedside. Preparing to administer Depakote IV. Pt awake after seizure.

## 2017-04-21 NOTE — Consults (Addendum)
NEUROLOGY RESIDENT PGY-3 CONSULT    PATIENT:  Chloe Barron  MRN:         [id number]    NOTE DATE / TIME:  04/21/2017  @ 20:59    REQUESTED BY:  Foye Clock, MD   SERVICE / LOCATION:  ED  ADMISSION DATE:  04/21/2017      CHIEF COMPLAINT:  Seizures    HISTORY SOURCE:  Patient    HISTORY OF PRESENT ILLNESS:    Chloe Barron is a 21 year old lady with past medical history of Diamond Blackfan anemia s/p BMT 2016, ovarian failure, gastroparesis, superior mesenteric artery syndrome, hypoglycemia, J tube, epilepsy who presented to the ED by EMS after being found in status epilepticus.     According to the patient for the pastm few days she has been unable to eat or keep food down secondary to abdominal pain and severe nausea leading to today having a blood glucose of 42. She was found having a seizures for 20 minutes was given multiple doses of Benzos which were unsuccessful leading to intubation. Once seizure free patient returned to baseline status and was able to given and adequate history.    Patient takes Depakote 500mg  BID for seizures    ED course:  14mg  of Benzos, loaded with Keppra 1g  Had second seizure while in ED prior to keppra load    REVIEW OF SYSTEMS:  A 12-system ROS was performed and is negative except for those items noted in the HPI.        PAST MEDICAL & SURGICAL HISTORY:  Tessie Fass anemia s/p BMT 2016, ovarian failure, gastroparesis, superior mesenteric artery syndrome, J tube, epilepsy     MEDICATIONS, OUTPATIENT:  None       MEDICATIONS, INPATIENT:  Scheduled:Heparin PF 5,000 units/0.5 mL Injection 5,000 Units, SUBCUTANEOUS, Q12H    IV Fluids & Drips:  D5 / 0.45% NaCl, , IV, CONTINUOUS, Last Rate: 100 mL/hr at 04/21/17 1729    YNW:GNFAOZHYQMVHQ (TYLENOL) 650 mg/20.3 mL Solution 650 mg, J-TUBE, Q4H PRN  Fentanyl (SUBLIMAZE) Injection 50 mcg, IV, Q1H PRN  Magnesium Sulfate 2 grams in Sterile Water 50 mL IVPB, IV, PRN  Midazolam (VERSED) Injection 2 mg, IV, Q5MIN PRN  Potassium Chloride 10 mEq  in Sterile Water 100 mL IVPB, IV, PRN        ALLERGIES:  Allergies not on file    FAMILY HISTORY:  Unknown at this time      SOCIAL HISTORY:   Social History     Social History    Marital status: SINGLE     Spouse name: N/A    Number of children: N/A    Years of education: N/A     Occupational History    Not on file.     Social History Main Topics    Smoking status: Not on file    Smokeless tobacco: Not on file    Alcohol use Not on file    Drug use: Not on file    Sexual activity: Not on file     Other Topics Concern    Not on file     Social History Narrative       EXAMINATION:    Weight: Weight: 40.4 kg (89 lb 1.1 oz) (04/21/17 1605)     Vitals:     Current  Minimum Maximum   BP BP: 95/61  BP: (95-114)/(61-92)    Temp Temp: 36.5 C (97.7 F)  Temp Min: 36.5 C (97.7 F)  Temp Max: 36.5 C (97.7 F)    Pulse Pulse: 60 Pulse Min: 51  Pulse Max: 101    Resp Resp: 16 Resp Min: 12  Resp Max: 26    O2 Sat SpO2: 98 % SpO2 Min: 98 % SpO2 Max: 100 %   O2 Deliv Endotracheal Tube ; No Data Recorded      Rate (total): 20  Peak pressure: 17  SpO2: 98 %  Flow (L/min): 15  Pulse: 60    24 Hour Intake/Output:       General Physical Exam:  Physical Exam    Neuro Exam:    Mental Status:   State:  AOx4, to self, person, place and situation.  Use of language:  Speech fluent    Cranial Nerves:  CN 2:        PERRLA 6mm.  Visual fields intact.   CN 3,4,6:  EOMI.  CN 5:        Sensation to LT intact.  Sensation to pinprick intact.  Masseter strength intact.  CN 7:        Facial symmetric.  Eye closure strength without deficits.  CN 8:        Hearing intact to voice.  CN 9, 10:  Palate elevation evident.  CN 11:      SCM strength intact.  Trapezius strength intact.  CN 12:      Tongue protrusion midline.    Motor:  Tone and bulk:  normal , decreased   Adventitious movements:  absent    Pronator drift:  absent   Moving all extremities equally    Sensory:  Light touch:  Intact      Cortical:  Extinction on DSS (neglect):   absent    DIAGNOSTIC STUDIES:  Lab Results - 24 hours (excluding micro and POC)   CBC WITH DIFFERENTIAL     Status: Abnormal   Result Value Status    White Blood Cell Count 8.1 Final    Red Blood Cell Count 3.69 (L) Final    Hemoglobin 12.0 Final    Hematocrit 33.9 (L) Final    MCV 91.7 Final    MCH 32.6 Final    MCHC 35.5 Final    RDW 13.8 Final    MPV 7.2 Final    Platelet Count 224 Final    Neutrophils % Auto 63.0 Final    Lymphocytes % Auto 21.4 Final    Monocytes % Auto 5.3 Final    Eosinophil % Auto 9.4 Final    Basophils % Auto 0.9 Final    Neutrophil Abs Auto 5.1 Final    Lymphocyte Abs Auto 1.7 Final    Monocytes Abs Auto 0.4 Final    Eosinophil Abs Auto 0.8 (H) Final    Basophils Abs Auto 0.1 Final   BASIC METABOLIC PANEL     Status: Abnormal   Result Value Status    Sodium 138 Final    Potassium 3.1 (L) Final    Chloride 104 Final    Carbon Dioxide Total 24 Final    Urea Nitrogen, Blood (BUN) 4 (L) Final    Glucose 90 Final    Calcium 8.7 Final    Creatinine Serum 0.56 Final    E-GFR, African American >60 Final    E-GFR, Non-African American >60 Final   HEPATIC FUNCTION PANEL     Status: Abnormal   Result Value Status    Protein 6.0 (L) Final    Alkaline Phosphatase (ALP) 93 Final  Aspartate Transaminase (AST) 21 Final    Bilirubin Total 0.5 Final    Alanine Transferase (ALT) 16 Final    Bilirubin Direct 0.1 Final    Albumin 3.2 (L) Final   LACTIC ACID, WHOLE BLD VENOUS     Status: Normal   Result Value Status    LACTIC ACID, WHOLE BLD VENOUS 1.5 Final   BLD GAS VENOUS     Status: Abnormal   Result Value Status    PATIENT TEMP, VEN  Final    PO2, VEN 60 (H) Final    O2 SAT, VEN 90 Final    PCO2, VEN 48 Final    pH, VEN 7.37 Final    HCO3, VEN 27 Final    BASE EXCESS, VEN 2 Final   LIPASE     Status: Normal   Result Value Status    Lipase 23 Final   ETHANOL, PLASMA     Status: None   Result Value Status    ETHANOL, PLASMA  Negative Final   AMMONIA     Status: Abnormal   Result Value Status    Ammonia 36  (H) Final         SUMMARY & IMPRESSION:  Chloe Barron is a 21 year old lady with past medical history of Diamond Blackfan anemia s/p BMT 2016, ovarian failure, gastroparesis, superior mesenteric artery syndrome, J tube, epilepsy who presented to the ED by EMS after being found in status epilepticus.     After assessing the patient and discussing her findings once extubated she was able to confer that her seizures were related to her low blood glucose. Restarting the patient on her AEDs and addressing her medical issues are recommended. At this time there is no need for cEEG monitoring, however would recommend [facility] for further assessment.     PLAN / RECOMMENDATIONS:   # Status epilepticus s/s intubation, resolved  - Loaded with Keppra 1gonce  - Continue Depakote 500mg  BID  - Valproic acid level pending  - Ammonia level pending  - Infectious work up pending  - CTH pending  - For seizures lasting >3 mins or >3 within 1 hour given 2mg  Ativan IV call neuro  - Seizure precautions  - Seizure educations      For questions or concerns please fee free to contact us.        Patient was seen and discussed with neurology attending Dr. Roosevelt Locks.       Report Electronically Signed By:  Ellin Saba MD, PGY-3  Department of Neurology   [facility]   Pager [phone]  Neurology crosscover pager [phone]  Crosscover 7p-7a nights and all weekend hours      This patient was seen, evaluated, and care plan was developed with the resident. I agree with the assessment and plan as outlined in the resident's note with the following additions: Hx of epilepsy and endocrine dysfunction c/w "Endocrine Dysfunction in Diamond-Blackfan Anemia" -- Lahoti et al Pediatr Blood Cancer 2016.  Pt with gastroparesis, tube feeding, dysautonomia on glucocortcoids and hx of hypoglycemic episodes complicating seizure management.       Report electronically signed by Volanda Napoleon, MD. Attending

## 2017-04-21 NOTE — Nurse Focus (Signed)
Pt arrived to [facility] @  2200, A&O x4, VSS.  @2220  pt had witnessed tonic clonic sz that lasted 1 min. @2222  pt had 2 additional sz lasting approximately 30 seconds with 1-2 minute post ictal period.  2mg  Ativan given.  MICU paged to bedside and assessed pt.  Neuro consulted and paged to bedside.  Concern for psuedoseizures.  Pt downgraded to tele.

## 2017-04-21 NOTE — Allied Health Progress (Signed)
CLINICAL SOCIAL SERVICES   CRISIS SERVICES CRITICAL CARE NOTE  Note Date and Time: 04/21/2017    16:38  Date of Admission: 04/21/2017  3:51 PM    Date of Service: 04/21/2017  Referred By: medical team   Patient Name: Chloe Barron Ethnicity: unknown   DOB: 04/21/1996  Age: 71yr      Reason for Referral: 944 activation; ID patient/notify NOK; provide support  Next of Kin:  Phone:    Emergency Contact(s):  Name: Manus Rudd Phone: [phone]     Reason for medical treatment: Seizures  Interpreter Assisting with Interview: N/a  Persons Interviewed: [facility]    HISTORY OF PRESENTING PROBLEM  Patient BIB[facility]. EMS states they picked Pt up at airport near [place], and Pt's mom on her way.    DEMOGRAPHICS:   [address]  [place], [place] [address]    PSYCHOSOCIAL HISTORY  Unable to assess - family not at bedside, per EMS they are on their way from the [facility] area  ASSESSMENT  Unable to assess, Pt being attended to by medical team.  Pt communicating by writing at this time.    INTERVENTION   Consulted with the medical team.     Support Services   Per EMS, Pt's parents en route from [place] area    DISPOSITION / PLAN  Continued Social Services support will be provided as needed throughout patient's ED stay.  Per Medical team, Pt does not want her mother to visit at this time. Social Services to f/u.    Date: 04/21/2017  Time: 16:38    Electronically Signed by:  Moreen Fowler, LCSW  Clinical Social Services  Pager [phone]

## 2017-04-21 NOTE — Progress Notes (Addendum)
Brief MICU fellow note    Patient admitted following seizures at home in setting of med noncompliance 2/2 nonfunctioning Jtube.  Per EMS the home seizure lasted 20 minutes.  Patient initially arrived to ED intubated but had been extubated prior to our evaluation.  She had additional shorter seizures in the ER which resolved with ativan.  Patient returned to her baseline following events.  She otherwise is well appearing and short of her nonfunctioning Jtube, does not have extensive acute medical needs at this time.  Upon arrival to T7 blue, patient had three seizures 1 min then 30s x2 over a period of a few minutes described by the nurse as tonic movements of the arms and post-ictal after.  Neurology had evaluated earlier in the evening and provided recommendations.  Neuro resident came to beside to help reassess after the recurrent seizure in the ICU and after chart review noting h/o pseudoseizure and uncertainty whether the patient was truly having seizure activity, offered to transfer patient to neuro service as primary.  Recommend consulting GI in the morning for management of the nonfunctioning Jtube and compromised Jtube (patient reports her [place] pig bit the tube and pierced it so expect it will need to be replaced regardless of functionality).    Azalia Bilis, MD PCCM Fellow pager [phone]    Events noted. My history from the patient was slightly different regarding initial presentation (see H&P). Agree with neurology taking over as primary team.   Foye Clock, MD

## 2017-04-21 NOTE — ED Nursing Note (Signed)
Pt lying quietly on gurney, pt provided with extra blankets for comfort, pt states she is having abdominal pain, will inform MD, pt states that she has been having abdominal pain, diarrhea and vomiting for a few days, pt states she has been unable to tolerate her tube feedings, pt also states that she has a hole in her Gtube that she noticed today, MD aware

## 2017-04-21 NOTE — ED Nursing Note (Signed)
Pt having multiple seizures that last approximately 15-30 seconds, pt gets rigid and then shakes, pt with a postictal phase, when comes to patient states "I'm sorry", pt reassured and calmed, pt remains on monitor, pt to have EEG at bedside

## 2017-04-21 NOTE — ED Nursing Note (Signed)
Patient admitted to [facility]. Documentation updated and complete. Patient report communicated via telephone. Patient transported via gurney by Engineer, manufacturing. Chloe Barron

## 2017-04-21 NOTE — ED Nursing Note (Signed)
Pt awake and alert, pt writing for communication, pt requesting breathing tube removed, pt requesting no parents to come and see her at this time, MD and neuro aware, pt calmed and reassured

## 2017-04-21 NOTE — ED Nursing Note (Signed)
Pt sleeping on gurney, NAD noted. Pt arousable but states she is tired and falls back to sleep easily. Pharmacist at bedside to assess home meds. G tube in place, taped to abd. Per nursing report, hole in catheter and not being used at this time. 3 PIVs noted. IVF D51/2NS infusing at this time, see MAR. Per report pt's parents have been notified of pt arrival to hospital by EMS but are not allowed to visit. Per report pt boyfriend and mother of boyfriend can visit. SW notified per report. SW at bedside states she will notify inpatient services. Will continue to monitor.
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
## 2017-04-21 NOTE — ED Triage Note (Signed)
Intubated after active seizure
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
## 2017-04-21 NOTE — Progress Notes (Addendum)
Brief Update  Was contacted by MICU team that patient had 3 breakthrough seizures, 1 minute, and two 30 second seizures all within 5 minute at 22:20 requiring 2mg  IV ativan to abort. They requested transfer to neurology to which I agreed. Upon chart review I discovered that patient has extensive history of pseudoseizures previously. I requested to be contacted during next seizure so I can evaluate the nature of these spells.        Report electronically signed by Jake Church, MD. Attending

## 2017-04-21 NOTE — ED Nursing Note (Signed)
Pt BIB [facility], hx seizure disorder, today had episode of status epilepticus for 20 minutes prior to air team arrival. Received 9mg  versed, stopped seizing momentarily before seizing again with L gaze. RSI performed in field with 55mg  rocuronium 2.5mg  versed. 6.5 tube 23cm at the teeth. Family en route.

## 2017-04-21 NOTE — ED Nursing Note (Signed)
Neuro at pt BS.

## 2017-04-22 ENCOUNTER — Other Ambulatory Visit: Payer: Self-pay | Admitting: DIAGNOSTIC RADIOLOGY

## 2017-04-22 DIAGNOSIS — R9431 Abnormal electrocardiogram [ECG] [EKG]: Secondary | ICD-10-CM

## 2017-04-22 DIAGNOSIS — K9413 Enterostomy malfunction: Principal | ICD-10-CM

## 2017-04-22 LAB — CBC NO DIFFERENTIAL
Hematocrit: 31.6 % — ABNORMAL LOW (ref 36.0–46.0)
Hemoglobin: 11 g/dL — ABNORMAL LOW (ref 12.0–16.0)
MCH: 32.2 pg (ref 27.0–33.0)
MCHC: 34.9 % (ref 32.0–36.0)
MCV: 92.3 UM3 (ref 80.0–100.0)
MPV: 7.5 UM3 (ref 6.8–10.0)
Platelet Count: 271 K/MM3 (ref 130–400)
RDW: 13.6 % (ref 0.0–14.7)
Red Blood Cell Count: 3.42 M/MM3 — ABNORMAL LOW (ref 4.10–5.10)
White Blood Cell Count: 7.5 K/MM3 (ref 4.5–13.0)

## 2017-04-22 LAB — UR DRUGS COMPREHENSIVE
AMPHETAMINE SCREEN, URINE: NEGATIVE ng/mL
BARBITURATES SCREEN, URINE: NEGATIVE
BENZODIAZEPINES SCREEN, URINE: POSITIVE — AB
COCAINE METABOLITE SCRN, URINE: NEGATIVE
OPIATES SCREEN, URINE: NEGATIVE

## 2017-04-22 LAB — URINALYSIS-COMPLETE
Bilirubin Urine: NEGATIVE
Glucose Urine: NEGATIVE mg/dL
Ketones: NEGATIVE mg/dL
Nitrite Urine: NEGATIVE
Protein Urine: NEGATIVE mg/dL
RBC: 2 /HPF (ref 0–5)
Specific Gravity, Urine: 1.009 (ref 1.002–1.030)
Squamous EPI: 3 /HPF (ref 0–5)
Urobilinogen: NEGATIVE mg/dL (ref ?–2.0)
WBC, Urine: 1 /HPF (ref 0–5)
pH URINE: 7 (ref 4.8–7.8)

## 2017-04-22 LAB — BASIC METABOLIC PANEL
CALCIUM: 8.3 mg/dL — AB (ref 8.6–10.5)
CARBON DIOXIDE TOTAL: 24 mmol/L (ref 24–32)
CHLORIDE: 105 mmol/L (ref 95–110)
CREATININE BLOOD: 0.53 mg/dL (ref 0.44–1.27)
Glucose: 90 mg/dL (ref 70–99)
Potassium: 4.2 mmol/L (ref 3.3–5.0)
SODIUM: 137 mmol/L (ref 135–145)
UREA NITROGEN, BLOOD (BUN): 2 mg/dL — AB (ref 8–22)

## 2017-04-22 LAB — UR DRUGS BY GC W/REFLEX: UR DRUGS BY GC: POSITIVE — AB

## 2017-04-22 LAB — C DIFFICILE SURVEILLANCE TEST: Test Result: POSITIVE — AB

## 2017-04-22 LAB — URINE GCMS CONFIRMATION

## 2017-04-22 LAB — URINE GC CONFIRMATION

## 2017-04-22 MED ORDER — MORPHINE 2 MG/ML INJECTION SYRINGE
1.0000 mg | INJECTION | INTRAMUSCULAR | Status: DC | PRN
Start: 2017-04-22 — End: 2017-04-22
  Administered 2017-04-22 (×2): 2 mg via INTRAVENOUS
  Filled 2017-04-22 (×2): qty 1

## 2017-04-22 MED ORDER — SULFAMETHOXAZOLE 800 MG-TRIMETHOPRIM 160 MG TABLET
1.0000 | ORAL_TABLET | Freq: Two times a day (BID) | ORAL | 0 refills | Status: AC
Start: 2017-04-22 — End: 2017-04-25

## 2017-04-22 MED ORDER — PROCHLORPERAZINE MALEATE 10 MG TABLET
5.0000 mg | ORAL_TABLET | Freq: Once | ORAL | Status: AC
Start: 2017-04-22 — End: 2017-04-22
  Administered 2017-04-22: 10 mg via ORAL
  Filled 2017-04-22: qty 1

## 2017-04-22 MED ORDER — ONDANSETRON HCL (PF) 4 MG/2 ML INJECTION SOLUTION
4.0000 mg | Freq: Two times a day (BID) | INTRAMUSCULAR | Status: DC | PRN
Start: 2017-04-22 — End: 2017-04-22
  Administered 2017-04-22 (×2): 4 mg via INTRAVENOUS
  Filled 2017-04-22 (×2): qty 2

## 2017-04-22 MED ORDER — SULFAMETHOXAZOLE 800 MG-TRIMETHOPRIM 160 MG TABLET
1.0000 | ORAL_TABLET | Freq: Two times a day (BID) | ORAL | Status: DC
Start: 2017-04-22 — End: 2017-04-22
  Administered 2017-04-22: 1 via ORAL
  Filled 2017-04-22: qty 1

## 2017-04-22 MED ORDER — VALPROIC ACID (AS SODIUM SALT) 250 MG/5 ML ORAL SOLUTION
250.0000 mg | Freq: Two times a day (BID) | ORAL | 5 refills | Status: DC
Start: 2017-04-22 — End: 2017-05-31

## 2017-04-22 MED ORDER — NACL 0.9% IV BOLUS - DURATION REQ
500.0000 mL | Freq: Once | INTRAVENOUS | Status: AC
Start: 2017-04-22 — End: 2017-04-22
  Administered 2017-04-22: 500 mL via INTRAVENOUS
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
## 2017-04-22 NOTE — Nurse Focus (Signed)
Pt appears to be pulling at tape on hole in peg.  Advised not to touch peg at this point. BP low. Fluid bolus given

## 2017-04-22 NOTE — Nurse Discharge Note (Signed)
Pt spoke with MD. Was to stay the night and go home tomorrow but pt changed mind and wants to go home tonight. Does not want to stay in ICU anymore.  Confirmed family will be at home if pt driven home.  Discharge instructions reviewed with pt.  States understanding.  IV dc'd. Pt belongings returned.  States she brought in meds but unable to verify as ED belongings list does not list any medications brought in and pharmacy does not have any home medications for pt.  Pt advised.  DC home via lyft in stable condition.

## 2017-04-22 NOTE — Nurse Focus (Signed)
Pt medications found in ER safe.  Returned to pt. All medications present. Pt escorted to [employer] for lyft ride. Outside waiting for ride home.  Pick up 6:30 pm

## 2017-04-22 NOTE — Nurse Focus (Signed)
@  0147 pt had a 2 minute witnessed sz, Neuro paged to bedside to observe sz activity.  Pt continued to sz intermittently for 10 minutes.  Sz activity witnessed by Neurology.  No medications given at this time.

## 2017-04-22 NOTE — Discharge Instructions (Signed)
Admissions Date: 04/21/2017  Discharge Date: 04/22/2017    Diagnoses:You presented to the hospital with shaking spells that were determined not to be seizures. We will continue Depakote, however will switch it to the liquid for for you. For you G-J Tube we recommend that you follow up with IR Radiology, they will call you on Monday to schedule possible fix of the tube. Alternatively, you may go to [facility] where the tube was placed.       Medications at Discharge:   [place] Tariyah, Pendry   Home Medication Instructions UEA:[id number]    Printed on:04/22/17 1733   Medication Information                      Albuterol (PROAIR HFA, PROVENTIL HFA, VENTOLIN HFA) 90 mcg/actuation inhaler  Take 1-2 puffs by inhalation every 6 hours if needed.             Erythromycin 250 mg Tablet  Take 250 mg by mouth 3 times daily with meals.             FLUoxetine 60 mg Tablet  Take 60 mg by mouth every day at bedtime.             Hydrocortisone (CORTEF) 10 mg Tablet  Take 15 mg by mouth every 8 hours if needed (fever/illness).             Hydrocortisone (SOLU-CORTEF) 100 mg Injection  Inject 50 mg into the muscle if needed (Unable to use PO hydrocortisone).             HydrOXYzine (ATARAX) 25 mg Tablet as Hydrochloride  Take 25 mg by mouth three times daily if needed.             LevETIRAcetam (KEPPRA) 100 mg/mL Liquid  Take 7.5 mL by mouth 2 times daily.             Lorazepam (ATIVAN) 1 mg Tablet  Take 2 mg by mouth every 8 hours if needed. Indications: Cancer Chemotherapy-Induced Nausea and Vomiting                         Montelukast (SINGULAIR) 10 mg Tablet  Take 10 mg by mouth every evening.             Pregabalin (LYRICA) 75 mg capsule  Take 75 mg by mouth 3 times daily.             Promethazine (PHENERGAN) 25 mg Tablet  Take 25 mg by mouth every 4 hours if needed for nausea/vomiting.             Ranitidine (ZANTAC) 150 mg Tablet  Take 150 mg by mouth 2 times daily.             Scopolamine (TRANSDERM-SCOP) 1 mg over 3 days  Patch  Apply 1 patch to the skin every third day. (Every 72 hours).             Tramadol (ULTRAM) 50 mg Tablet  Take 50 mg by mouth every 4 hours if needed for pain.             Trimethoprim 160 mg/Sulfamethoxazole 800 mg (BACTRIM DS) Tablet  Take 1 tablet by mouth 2 times daily.             Valproate Sodium (DEPAKENE SYRUP) 250 mg/5 mL Liquid  Take 5 mL by mouth 2 times daily.  Instructions:  Activity:   As tolerated     Follow up:  - Make an appointment to see your primary care physician in 1 week  - A referral has been placed for you to be seen by the following specialists:   Interventional Radiology

## 2017-04-22 NOTE — Nurse Focus (Signed)
Pt assessed as noted.  VSS.  Able to use feeding tube for med administration if hole is taped.  NAD, sad affect.

## 2017-04-23 ENCOUNTER — Emergency Department (EMERGENCY_DEPARTMENT_HOSPITAL): Payer: MEDICAID

## 2017-04-23 ENCOUNTER — Encounter (HOSPITAL_BASED_OUTPATIENT_CLINIC_OR_DEPARTMENT_OTHER): Payer: Medicaid Other

## 2017-04-23 ENCOUNTER — Inpatient Hospital Stay
Admission: EM | Admit: 2017-04-23 | Discharge: 2017-05-03 | DRG: 092 | Disposition: A | Discharge status: Home / Self Care | Payer: MEDICAID | Attending: Internal Medicine | Admitting: Internal Medicine

## 2017-04-23 ENCOUNTER — Emergency Department: Payer: MEDICAID

## 2017-04-23 DIAGNOSIS — T8600 Unspecified complication of bone marrow transplant: Secondary | ICD-10-CM | POA: Diagnosis present

## 2017-04-23 DIAGNOSIS — Z68.41 Body mass index (BMI) pediatric, less than 5th percentile for age: Secondary | ICD-10-CM | POA: Insufficient documentation

## 2017-04-23 DIAGNOSIS — R4182 Altered mental status, unspecified: Secondary | ICD-10-CM

## 2017-04-23 DIAGNOSIS — K5900 Constipation, unspecified: Secondary | ICD-10-CM | POA: Diagnosis not present

## 2017-04-23 DIAGNOSIS — E7220 Disorder of urea cycle metabolism, unspecified: Secondary | ICD-10-CM | POA: Diagnosis present

## 2017-04-23 DIAGNOSIS — G92 Toxic encephalopathy: Principal | ICD-10-CM | POA: Diagnosis present

## 2017-04-23 DIAGNOSIS — Y833 Surgical operation with formation of external stoma as the cause of abnormal reaction of the patient, or of later complication, without mention of misadventure at the time of the procedure: Secondary | ICD-10-CM | POA: Diagnosis present

## 2017-04-23 DIAGNOSIS — Y92019 Unspecified place in single-family (private) house as the place of occurrence of the external cause: Secondary | ICD-10-CM | POA: Insufficient documentation

## 2017-04-23 DIAGNOSIS — Z9481 Bone marrow transplant status: Secondary | ICD-10-CM | POA: Insufficient documentation

## 2017-04-23 DIAGNOSIS — Y92238 Other place in hospital as the place of occurrence of the external cause: Secondary | ICD-10-CM | POA: Diagnosis present

## 2017-04-23 DIAGNOSIS — R400 Somnolence: Secondary | ICD-10-CM | POA: Insufficient documentation

## 2017-04-23 DIAGNOSIS — D6101 Constitutional (pure) red blood cell aplasia: Secondary | ICD-10-CM | POA: Diagnosis present

## 2017-04-23 DIAGNOSIS — I10 Essential (primary) hypertension: Secondary | ICD-10-CM | POA: Diagnosis present

## 2017-04-23 DIAGNOSIS — K551 Chronic vascular disorders of intestine: Secondary | ICD-10-CM | POA: Diagnosis present

## 2017-04-23 DIAGNOSIS — F329 Major depressive disorder, single episode, unspecified: Secondary | ICD-10-CM | POA: Diagnosis present

## 2017-04-23 DIAGNOSIS — K9413 Enterostomy malfunction: Secondary | ICD-10-CM | POA: Diagnosis present

## 2017-04-23 DIAGNOSIS — R569 Unspecified convulsions: Secondary | ICD-10-CM

## 2017-04-23 DIAGNOSIS — E876 Hypokalemia: Secondary | ICD-10-CM | POA: Diagnosis present

## 2017-04-23 DIAGNOSIS — M810 Age-related osteoporosis without current pathological fracture: Secondary | ICD-10-CM | POA: Diagnosis present

## 2017-04-23 DIAGNOSIS — J45909 Unspecified asthma, uncomplicated: Secondary | ICD-10-CM | POA: Diagnosis present

## 2017-04-23 DIAGNOSIS — Z934 Other artificial openings of gastrointestinal tract status: Secondary | ICD-10-CM

## 2017-04-23 DIAGNOSIS — D89811 Chronic graft-versus-host disease: Secondary | ICD-10-CM | POA: Diagnosis present

## 2017-04-23 DIAGNOSIS — F445 Conversion disorder with seizures or convulsions: Secondary | ICD-10-CM

## 2017-04-23 DIAGNOSIS — K3184 Gastroparesis: Secondary | ICD-10-CM | POA: Diagnosis present

## 2017-04-23 DIAGNOSIS — R918 Other nonspecific abnormal finding of lung field: Secondary | ICD-10-CM

## 2017-04-23 DIAGNOSIS — E440 Moderate protein-calorie malnutrition: Secondary | ICD-10-CM | POA: Diagnosis present

## 2017-04-23 DIAGNOSIS — F4310 Post-traumatic stress disorder, unspecified: Secondary | ICD-10-CM | POA: Diagnosis present

## 2017-04-23 DIAGNOSIS — T424X5A Adverse effect of benzodiazepines, initial encounter: Secondary | ICD-10-CM | POA: Diagnosis present

## 2017-04-23 DIAGNOSIS — H902 Conductive hearing loss, unspecified: Secondary | ICD-10-CM | POA: Diagnosis present

## 2017-04-23 LAB — COMPREHENSIVE METABOLIC PANEL
ALANINE TRANSFERASE (ALT): 76 U/L — AB (ref 5–54)
ALBUMIN: 2.5 g/dL — AB (ref 3.4–4.8)
ALKALINE PHOSPHATASE (ALP): 82 U/L (ref 30–130)
ASPARTATE TRANSAMINASE (AST): 44 U/L — AB (ref 15–43)
BILIRUBIN TOTAL: 0.3 mg/dL (ref 0.3–1.3)
CALCIUM: 8.2 mg/dL — AB (ref 8.6–10.5)
CARBON DIOXIDE TOTAL: 25 mmol/L (ref 24–32)
CHLORIDE: 111 mmol/L — AB (ref 95–110)
CREATININE BLOOD: 0.66 mg/dL (ref 0.44–1.27)
GLUCOSE: 83 mg/dL (ref 70–99)
POTASSIUM: 3.2 mmol/L — AB (ref 3.3–5.0)
PROTEIN: 5 g/dL — AB (ref 6.3–8.3)
Sodium: 140 mmol/L (ref 135–145)

## 2017-04-23 LAB — CBC NO DIFFERENTIAL
HEMATOCRIT: 29.7 % — AB (ref 36.0–46.0)
HEMOGLOBIN: 10.2 g/dL — AB (ref 12.0–16.0)
MCH: 31.7 pg (ref 27.0–33.0)
MCHC: 34.5 % (ref 32.0–36.0)
MCV: 92 UM3 (ref 80.0–100.0)
MPV: 7 UM3 (ref 6.8–10.0)
PLATELET COUNT: 229 10*3/uL (ref 130–400)
RDW: 13.9 % (ref 0.0–14.7)
RED CELL COUNT: 3.23 10*6/uL — AB (ref 4.10–5.10)
WHITE BLOOD CELL COUNT: 13 10*3/uL (ref 4.5–13.0)

## 2017-04-23 LAB — CBC WITH DIFFERENTIAL
BASOPHILS % AUTO: 0.4 %
BASOPHILS ABS AUTO: 0.1 10*3/uL (ref 0.0–0.2)
EOSINOPHIL % AUTO: 4.8 %
EOSINOPHIL ABS AUTO: 0.8 10*3/uL — AB (ref 0.0–0.5)
HEMATOCRIT: 32.7 % — AB (ref 36.0–46.0)
HEMOGLOBIN: 11.2 g/dL — AB (ref 12.0–16.0)
LYMPHOCYTE ABS AUTO: 2.1 10*3/uL (ref 1.2–5.2)
LYMPHOCYTES % AUTO: 12.5 %
MCH: 31.4 pg (ref 27.0–33.0)
MCHC: 34.3 % (ref 32.0–36.0)
MCV: 91.5 UM3 (ref 80.0–100.0)
MONOCYTES % AUTO: 6 %
MPV: 7 UM3 (ref 6.8–10.0)
Monocytes Abs Auto: 1 10*3/uL — ABNORMAL HIGH (ref 0.1–0.8)
NEUTROPHIL ABS AUTO: 12.5 10*3/uL — AB (ref 1.8–8.0)
NEUTROPHILS % AUTO: 76.3 %
PLATELET COUNT: 260 10*3/uL (ref 130–400)
RDW: 13.9 % (ref 0.0–14.7)
RED CELL COUNT: 3.57 10*6/uL — AB (ref 4.10–5.10)
WHITE BLOOD CELL COUNT: 16.4 10*3/uL — AB (ref 4.5–13.0)

## 2017-04-23 LAB — BASIC METABOLIC PANEL
CALCIUM: 8.3 mg/dL — AB (ref 8.6–10.5)
CARBON DIOXIDE TOTAL: 25 mmol/L (ref 24–32)
CHLORIDE: 105 mmol/L (ref 95–110)
CREATININE BLOOD: 0.68 mg/dL (ref 0.44–1.27)
GLUCOSE: 82 mg/dL (ref 70–99)
POTASSIUM: 3 mmol/L — AB (ref 3.3–5.0)
SODIUM: 136 mmol/L (ref 135–145)
UREA NITROGEN, BLOOD (BUN): 1 mg/dL — AB (ref 8–22)

## 2017-04-23 LAB — HEPATIC FUNCTION PANEL
ALANINE TRANSFERASE (ALT): 89 U/L — AB (ref 5–54)
ALBUMIN: 2.8 g/dL — AB (ref 3.4–4.8)
ALKALINE PHOSPHATASE (ALP): 93 U/L (ref 30–130)
Aspartate Transaminase (AST): 55 U/L — ABNORMAL HIGH (ref 15–43)
BILIRUBIN DIRECT: 0.1 mg/dL (ref 0.0–0.2)
BILIRUBIN TOTAL: 0.4 mg/dL (ref 0.3–1.3)
PROTEIN: 5.5 g/dL — AB (ref 6.3–8.3)

## 2017-04-23 LAB — MAGNESIUM (MG): MAGNESIUM (MG): 1.6 mg/dL (ref 1.5–2.6)

## 2017-04-23 LAB — BLD GAS VENOUS
BASE EXCESS, VEN: 3 meq/L — AB (ref <=2)
HCO3, VEN: 27 meq/L (ref 20–28)
O2 SAT, VEN: 99 % (ref 70–100)
PCO2, VEN: 42 mmHg (ref 35–50)
PH, VEN: 7.42 — AB (ref 7.30–7.40)
PO2, VEN: 145 mmHg — AB (ref 30–55)

## 2017-04-23 LAB — PHOSPHORUS (PO4): PHOSPHORUS (PO4): 4.3 mg/dL (ref 2.4–5.0)

## 2017-04-23 LAB — LACTIC ACID, WHOLE BLD VENOUS: Lactic Acid, Whole Bld Venous: 1.2 mmol/L (ref 0.9–1.7)

## 2017-04-23 LAB — CULTURE SURVEILLANCE, MRSA

## 2017-04-23 LAB — LIPASE: Lipase: 17 U/L (ref 13–51)

## 2017-04-23 LAB — VALPROATE

## 2017-04-23 LAB — CREATINE KINASE: Creatine Kinase: 20 U/L (ref 0–250)

## 2017-04-23 MED ORDER — PROMETHAZINE 6.25 MG/5 ML ORAL SYRUP
25.0000 mg | ORAL_SOLUTION | ORAL | Status: DC | PRN
Start: 2017-04-23 — End: 2017-04-25
  Administered 2017-04-25: 25 mg via ORAL
  Filled 2017-04-23 (×2): qty 20

## 2017-04-23 MED ORDER — DEPRECATED NACL 0.9% 100ML
500.0000 mg | Freq: Once | Status: AC
Start: 2017-04-23 — End: 2017-04-23
  Administered 2017-04-23: 500 mg via INTRAVENOUS
  Filled 2017-04-23: qty 5

## 2017-04-23 MED ORDER — PREGABALIN 50 MG CAPSULE
75.0000 mg | ORAL_CAPSULE | Freq: Three times a day (TID) | ORAL | Status: DC
Start: 2017-04-23 — End: 2017-05-03
  Administered 2017-04-24 – 2017-05-03 (×29): 75 mg via ORAL
  Filled 2017-04-23 (×29): qty 1

## 2017-04-23 MED ORDER — ERYTHROMYCIN 250 MG TABLET,DELAYED RELEASE
250.0000 mg | DELAYED_RELEASE_TABLET | Freq: Three times a day (TID) | ORAL | Status: DC
Start: 2017-04-24 — End: 2017-05-03
  Administered 2017-04-24 – 2017-05-03 (×29): 250 mg via ORAL
  Filled 2017-04-23 (×31): qty 1

## 2017-04-23 MED ORDER — LORAZEPAM 2 MG/ML INJECTION SOLUTION
2.0000 mg | Freq: Once | INTRAMUSCULAR | Status: AC
Start: 2017-04-23 — End: 2017-04-23
  Administered 2017-04-23: 2 mg via INTRAVENOUS

## 2017-04-23 MED ORDER — HYDROCORTISONE SODIUM SUCCINATE 100 MG SOLUTION FOR INJECTION
50.0000 mg | Freq: Three times a day (TID) | INTRAMUSCULAR | Status: DC | PRN
Start: 2017-04-23 — End: 2017-04-23

## 2017-04-23 MED ORDER — NACL 0.9% IV BOLUS - DURATION REQ
2000.0000 mL | Freq: Once | INTRAVENOUS | Status: AC
Start: 2017-04-23 — End: 2017-04-23
  Administered 2017-04-23: 2000 mL via INTRAVENOUS

## 2017-04-23 MED ORDER — VALPROIC ACID (AS SODIUM SALT) 250 MG/5 ML (5 ML) ORAL SOLUTION
250.0000 mg | Freq: Two times a day (BID) | ORAL | Status: DC
Start: 2017-04-24 — End: 2017-05-03
  Administered 2017-04-24 – 2017-05-03 (×19): 250 mg via ORAL
  Filled 2017-04-23 (×20): qty 5

## 2017-04-23 MED ORDER — LIDOCAINE HCL 10 MG/ML (1 %) INJECTION SOLUTION
0.5000 mL | INTRAMUSCULAR | Status: DC | PRN
Start: 2017-04-23 — End: 2017-05-03

## 2017-04-23 MED ORDER — SULFAMETHOXAZOLE 800 MG-TRIMETHOPRIM 160 MG TABLET
1.0000 | ORAL_TABLET | Freq: Two times a day (BID) | ORAL | Status: DC
Start: 2017-04-23 — End: 2017-04-23

## 2017-04-23 MED ORDER — D5 / 0.2% NACL IV INFUSION
INTRAVENOUS | Status: DC
Start: 2017-04-23 — End: 2017-04-25
  Administered 2017-04-23 – 2017-04-24 (×3): via INTRAVENOUS

## 2017-04-23 MED ORDER — HEPARIN, PORCINE (PF) 5,000 UNIT/0.5 ML INJECTION SYRINGE
5000.0000 [IU] | INJECTION | Freq: Two times a day (BID) | INTRAMUSCULAR | Status: DC
Start: 2017-04-23 — End: 2017-04-24

## 2017-04-23 MED ORDER — POTASSIUM CHLORIDE 20 MEQ/15 ML ORAL LIQUID
10.0000 meq | Freq: Once | ORAL | Status: AC
Start: 2017-04-23 — End: 2017-04-24
  Administered 2017-04-24: 60 meq via ORAL
  Filled 2017-04-23: qty 45

## 2017-04-23 MED ORDER — FAMOTIDINE 20 MG TABLET
20.0000 mg | ORAL_TABLET | Freq: Two times a day (BID) | ORAL | Status: DC
Start: 2017-04-23 — End: 2017-05-03
  Administered 2017-04-24 – 2017-05-03 (×20): 20 mg via ORAL
  Filled 2017-04-23 (×20): qty 1

## 2017-04-23 MED ORDER — FLUOXETINE 20 MG/5 ML (4 MG/ML) ORAL SOLUTION
60.0000 mg | Freq: Every day | ORAL | Status: DC
Start: 2017-04-24 — End: 2017-05-03
  Administered 2017-04-24 – 2017-05-03 (×10): 60 mg via ORAL
  Filled 2017-04-23 (×11): qty 15

## 2017-04-23 MED ORDER — LEVETIRACETAM 500 MG/5 ML (5 ML) ORAL SOLUTION
750.0000 mg | Freq: Two times a day (BID) | ORAL | Status: DC
Start: 2017-04-24 — End: 2017-05-03
  Administered 2017-04-24 – 2017-05-03 (×19): 750 mg via ORAL
  Filled 2017-04-23 (×12): qty 10
  Filled 2017-04-23: qty 7.5
  Filled 2017-04-23 (×7): qty 10

## 2017-04-23 MED ORDER — MONTELUKAST 10 MG TABLET
10.0000 mg | ORAL_TABLET | Freq: Every day | ORAL | Status: DC
Start: 2017-04-23 — End: 2017-05-03
  Administered 2017-04-24 – 2017-05-02 (×10): 10 mg via ORAL
  Filled 2017-04-23 (×10): qty 1

## 2017-04-23 MED ORDER — LORAZEPAM 2 MG/ML INJECTION SOLUTION
INTRAMUSCULAR | Status: AC
Start: 2017-04-23 — End: 2017-04-23
  Filled 2017-04-23: qty 1

## 2017-04-23 MED ORDER — VALPROIC ACID (AS SODIUM SALT) 250 MG/5 ML (5 ML) ORAL SOLUTION
250.0000 mg | Freq: Three times a day (TID) | ORAL | Status: DC
Start: 2017-04-23 — End: 2017-04-23

## 2017-04-23 MED ORDER — LEVETIRACETAM 100 MG/ML ORAL SOLUTION
75.0000 mg | Freq: Two times a day (BID) | ORAL | Status: DC
Start: 2017-04-23 — End: 2017-04-23

## 2017-04-23 NOTE — ED Nursing Note (Signed)
Eye drops placed per neuro during sz activity. Pt pulled away. Awake now with spontaneous eye opening

## 2017-04-23 NOTE — ED Nursing Note (Signed)
Received report from Chris R., RN. Assumed pt care

## 2017-04-23 NOTE — ED Nursing Note (Signed)
Fiance at bs. Reports infection in J-tube.

## 2017-04-23 NOTE — Allied Health Progress (Signed)
CLINICAL SOCIAL SERVICES   CRISIS SERVICES CRITICAL CARE NOTE  Note Date and Time: 04/23/2017    17:26  Date of Admission: 04/23/2017  5:07 PM    Date of Service: 04/23/17 Referred By: medical team   Patient Name: Chloe Barron 11Yd Ethnicity: cauc   DOB: [birth year]  Age: 21yr      Reason for Referral: [id number] activation; ID patient/notify NOK; provide support  Next of Kin: Nolene Ebbs Phone: [phone]   Emergency Contact(s):  Name:  Phone:      Reason for medical treatment: Active seizures  Interpreter Assisting with Interview: no  Persons Interviewed: EMS, fiance    HISTORY OF PRESENTING PROBLEM  This 21 yr old female was [facility] due to ongoing seizures (4 in one hour).  Patient is continuing to seize in the ED. Per chart review, patient was just released from [employer] yesterday.  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  On August 24, (while in the ED) patient instructed EMS that she did not want her mother or father visiting.  [address]MOGRAPHIC[place]enton Meek  [facility] [place] [address]  Phone: There are no phone numbers on file.   PSYCHOSOCIAL HISTORY  Currently living with Fiance in [place]  ASSESSMENT  Patient remains in the ED where she is being evaluated and treated.  Likely admit  INTERVENTION   Consulted with the medical team.     Assisted with ID of patient.   Notified fiance is at bedside  DISPOSITION / PLAN  Continued Social Services support will be provided as needed throughout patient's ED stay.     Signature: Eulah Citizen       Pager # [phone]  Licensed Clinical Social Worker

## 2017-04-23 NOTE — ED Nursing Note (Signed)
21yo F transported by air   Pt had approx 4 sz in 1 hour . status sz  enroute given   22.5mg  versed.,   1000mg  keppra gtt  fs 91  Arrives on [facility] [place] [address]  Team at bs

## 2017-04-23 NOTE — ED Nursing Note (Signed)
PT WOKE UP AND ABLE TO FOLLOW SIMPLE COMMANDS.

## 2017-04-23 NOTE — ED Nursing Note (Signed)
Pt starting to sz  Name : Chloe Barron September 16, 1995

## 2017-04-23 NOTE — Discharge Summary (Addendum)
NEUROLOGY SERVICE  HOSPITAL DISCHARGE SUMMARY    PATIENT:  Chloe Barron 11We  MRN:         [id number]    NOTE DATE / TIME:  04/23/2017  @ 06:23    Admission date:  04/21/2017  3:51 PM    Discharge date:  04/22/17, 6:30pm  Attending at discharge:  Dr. Olena Leatherwood  Senior resident:  Dr. Wonda Olds  Junior resident:  Dr. Yehuda Savannah    DISCHARGE DIAGNOSES & PMH:   Seizure disorder  Non-epileptic seizures disorder  POTS      Past Medical History:   Diagnosis Date    Asthma     Chronic pain     Conductive hearing loss     Diamond-Blackfan anemia     Epilepsy     Essential hypertension     Gastroparesis     GVHD (graft versus host disease)     Osteoporosis     Ovarian failure     POTS (postural orthostatic tachycardia syndrome)     Protein calorie malnutrition     Pseudoseizure     PTSD (post-traumatic stress disorder)     Superior mesenteric artery syndrome      DISCHARGE MEDICATIONS:   Chloe Barron, Chloe Barron   Home Medication Instructions SMO:[id number]    Printed on:04/23/17 0623   Medication Information                      Albuterol (PROAIR HFA, PROVENTIL HFA, VENTOLIN HFA) 90 mcg/actuation inhaler  Take 1-2 puffs by inhalation every 6 hours if needed.             Erythromycin 250 mg Tablet  Take 250 mg by mouth 3 times daily with meals.             FLUoxetine 60 mg Tablet  Take 60 mg by mouth every day at bedtime.             Hydrocortisone (CORTEF) 10 mg Tablet  Take 15 mg by mouth every 8 hours if needed (fever/illness).             Hydrocortisone (SOLU-CORTEF) 100 mg Injection  Inject 50 mg into the muscle if needed (Unable to use PO hydrocortisone).             HydrOXYzine (ATARAX) 25 mg Tablet as Hydrochloride  Take 25 mg by mouth three times daily if needed.             LevETIRAcetam (KEPPRA) 100 mg/mL Liquid  Take 7.5 mL by mouth 2 times daily.             Lorazepam (ATIVAN) 1 mg Tablet  Take 2 mg by mouth every 8 hours if needed. Indications: Cancer Chemotherapy-Induced Nausea and Vomiting                          Montelukast (SINGULAIR) 10 mg Tablet  Take 10 mg by mouth every evening.             Pregabalin (LYRICA) 75 mg capsule  Take 75 mg by mouth 3 times daily.             Promethazine (PHENERGAN) 25 mg Tablet  Take 25 mg by mouth every 4 hours if needed for nausea/vomiting.             Ranitidine (ZANTAC) 150 mg Tablet  Take 150 mg by mouth 2 times daily.  Scopolamine (TRANSDERM-SCOP) 1 mg over 3 days Patch  Apply 1 patch to the skin every third day. (Every 72 hours).             Tramadol (ULTRAM) 50 mg Tablet  Take 50 mg by mouth every 4 hours if needed for pain.             Trimethoprim 160 mg/Sulfamethoxazole 800 mg (BACTRIM DS) Tablet  Take 1 tablet by mouth 2 times daily.             Valproate Sodium (DEPAKENE SYRUP) 250 mg/5 mL Liquid  Take 5 mL by mouth 2 times daily.               ALLERGIES:  Allergies   Allergen Reactions    Amoxicillin Hives    Asa [Aspirin] Unknown-Explain in Comments     bleeding    Basiliximab Angioedema    Betadine [Povidone-Iodine] Hives    Cheese Nausea/Vomiting    Docusate Anaphylaxis    Doxycycline Rash    Garlic Anaphylaxis    Penicillin Hives    Shellfish Containing Products Anaphylaxis    Sweet Potato Hives    Vancomycin Rash     CONSULTING SERVICES:  IR and GI     PROCEDURES:  Extubation on arrival from transfer 04/21/17    REASON FOR ADMISSION:  This is a 21yr-old right-handed female with a history of seizures vs NES, POTS, diamond-blackfam anemia s/p BMT (2015), gastroparesis (s/p J-tube 2018) presenting transferred from OSH with seizure activity lasting 45 minutes.     HOSPITAL COURSE:  This is a 21yr-old right-handed female with a history of seizures vs NES, POTS, diamond-blackfan anemia s/p BMT (2015), gastroproesis (s/p J-tube 2018) presenting with seizure activity lasting 45 minutes. Patient was intubated on arrival, but was able to communicate via writing and was subsequently extubated. She has had 5 brief episodes of <17mo shaking since being  extubated on 8/24, and during these seizures she appears to be rigid with shaking with right downward gaze, lasting for 30 seconds and then appears to be postictal. These were treated with  2 mg lorazepam x2, keppra 1000 mg, and valproate 500 mg and Neurology was consulted. Patient had further episode the night of admission 04/21/17 was observed by Neurology, and during episode normal saline was dropped into patient's eyes and the episode ceased. Patient was transferred from MICU to Neurology service as concern for seizure vs NES. No level for Depakote was drawn before load. Discussed with patient possible seizure disorder, further EEG studies and possible VET admission will be required in future. Patient has had many prior hospital admissions at other institutions per record review including this month at [place] where EEG was reportedly normal, and many admission out of state in [place] and [place] previously. Patient does not currently have neurology and discussed follow-up with epilepsy specialist will be important in future. Patient does not drive, and morbidity report has been reported at [facility] 04/12/17. No change in current AEDs were made at this admission, instructed to continue PTA Keppra and Depakote and follow-up with Neurology. Seizure precautions were discussed.    Additionally, J-tube place for gastroparesis had a small 1mm hold at distal tip. Reported from patient's pet guniea pig. Examined and repaired with taped and functional for meds and food per nursing. Patient can also take liquids PO as well, but prefers J-tube for food and medication. IR and GI consulted for repair. Recommended consultation regarding patient preference to convert to G-J tube  and IR nursing call and appointment to be set up 8/27.    COMPLICATIONS / ADVERSE DRUG EVENTS:  None    PERTINENT LABS / STUDIES:  LAST COMPREHENSIVE METABOLIC PANEL Recent labs for the past 72 hours     04/22/17 0555 04/21/17 1615    GLUCOSE 90 --     UREA NITROGEN, BLOOD (BUN) 2* --    CREATININE BLOOD 0.53 --    SODIUM 137 --    POTASSIUM 4.2 --    CHLORIDE 105 --    CARBON DIOXIDE TOTAL 24 --    CALCIUM 8.3* --    PROTEIN -- 6.0*    ALBUMIN -- 3.2*    BILIRUBIN TOTAL -- 0.5    ALKALINE PHOSPHATASE (ALP) -- 93    ASPARTATE TRANSAMINASE (AST) -- 21    ALANINE TRANSFERASE (ALT) -- 16        LAST CBC Recent labs for the past 72 hours     04/22/17 0555    WHITE BLOOD CELL COUNT 7.5    HEMOGLOBIN 11.0*    HEMATOCRIT 31.6*    PLATELET COUNT 271      CT Head 04/21/17  1. No acute intracranial hemorrhage or mass effect.    BRIEF DISCHARGE EXAM:  Vitals:  BP 95/57  Pulse 77  Temp 36.6 C (97.9 F) (Oral)  Resp 14  Ht 1.524 m (5')  Wt 40.4 kg (89 lb 1.1 oz)  LMP 04/20/2017  SpO2 90%  BMI 17.39 kg/m2    General Physical Exam:  General: Alert and interactive, young F, holding stuffed bear  HEENT: Normocephalic, MMM, neck supple, no carotid bruits   Cardiovascular:  RRR, S1, S2 no murmurs  Respiratory: CTAB, normal effort    Abdominal:  Non-distended, non-tender, J tube in place with 1mm hold at distal tip s/p repair  Extremities:  WNL, no cyanosis  Skin: Without rashes nor edema    Mental Status:   State:  AOx4, to self, person, place and situation.  Use of language:  Speech fluent    Cranial Nerves:  CN 2:        PERRLA 5mm to 3mm bilatearlly.  Visual fields intact.   CN 3,4,6:  EOMI.  CN 5:        Sensation to LT intact.  Sensation to pinprick intact.  Masseter strength intact.  CN 7:        Facial symmetric.  Eye closure strength without deficits.  CN 8:        L ear not intact to finger rub (conductive hearing loss s/p bone repair per patient), R ear hearing intact to finger rub  CN 9, 10:  Palate elevation evident.  CN 11:      SCM strength intact.  Trapezius strength intact.  CN 12:      Tongue protrusion midline.    Motor:  Tone and bulk:  normal , decreased   Adventitious movements:  absent    Pronator drift:  absent   Moving all  extremities equally    Sensory:  Light touch:  Intact                  Cortical:  Extinction on DSS (neglect): Absent    CONDITION:  Patient is stable for discharge.  DISPOSITION:  Discharge to home.    DISCHARGE INSTRUCTIONS:  1.  Take all medications as prescribed.  2.  Call 911 if you experience concerning symptoms, prolonged skaing, difficulty breathing  3.  Diet restrictions:  none  4.  Activity restrictions:  As tolerated    OUTPATIENT FOLLOW-UP PLAN:  Neurology/Epilsepsy specialist  for follow-up for seizures disorder and concern for NES  Interventional Radiology, nurse will call 04/24/17 to discuss appointment for J-tube change  GI, for consider for G-J tube    PENDING STUDIES AT DISCHARGE:  None    Report Electronically Signed By:  Josue Hector, MD  [facility] Neurology PGY2  Personal Pager: [phone]  PI: [phone]  Neurology Service Pager (7p-7a, Sat-Sun): [phone]      This patient was seen, evaluated, and care plan was developed with the resident.  I agree with the assessment and plan as outlined in the resident's note. A total of 35 minutes was spent in patient care, >50% of which was counseling.  Report electronically signed by Jake Church, MD. Attending

## 2017-04-23 NOTE — Nurse Assessment (Signed)
MICU RN Admission Note     Report received from [place], Charity fundraiser. Chloe Barron 11Yd admitted to MICU at 2130 hours from ED. Patient condition fair, (A) MICU team notified of arrival. Patient/family oriented to unit and call light in reach. Bed locked and low, alarms audible and appropriate, and emergency equipment at bedside. Plan of care initiated.    Hospital Course       Skin Assessment  Two-RN skin assessment performed with Tawni Levy, RN. Pressure ulcer: absent. Patch/ointment: absent.    Belongings Assessment  Two-RN belongings assessment performed with Tawni Levy, Charity fundraiser. Belongings with patient. List all belongings present on admission to [name] MICU: bra, underwear, pajama bottoms, shirt, blanket, and 2 stuffed bear.Marland Kitchen    Idelle Leech, RN  04/23/2017, 22:51

## 2017-04-23 NOTE — ED Nursing Note (Signed)
Called report to MICU, reported off to [place], 
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
## 2017-04-23 NOTE — Progress Notes (Addendum)
Brief Progress Note  The patient is a 21 year old female with a past medical history of gastroparesis s/p G-tube who presented to the ED for shaking spells. Initial concern was for seizures however patient having bilateral shaking, immediately resolved with saline drops to her eyes. Given bilaterally of the event with immediately cessation with eye drops determined likely pseudoseizure.     Patient was admitted to MICU and transferred to neurology service on 04/21/2017 with similar presentation and was intubated for these events and treated with benzos and admitted to . When the patient was evaluated for by neurology after extubation clear demonstration of pseudoseizures that resolved with sternal rub. The patient has no documented seizures proven by EEG. Patient has received significant benzos and thus must work on metabolism of these. Discussed with attending Dr. Arta Silence no official consultation required.     Plan  - Metabolism of Benzos- observation for metabolism of benzos as required  - Please do not treat further shaking events    Maurene Capes, MD  Resident Physician, PGY 4  [facility] Department of Neurology   Pager: [phone]  Neurology Service Pager: [phone]  Pediatric Neurology Service Pager: [phone] (7am-6pm Mon-Fri, otherwise page [phone])      This patient was seen by the resident.  I reviewed and agree with the resident's assessment and plan as outlined in the resident's note.  Report electronically signed by Odis Hollingshead, MD. Attending

## 2017-04-23 NOTE — ED Nursing Note (Signed)
Sz activity approx 10 seconds.   Neuro at bs

## 2017-04-23 NOTE — ED Initial Note (Signed)
EMERGENCY DEPARTMENT PHYSICIAN NOTE - Ande Schnepp 11Yd       Date of Service:   04/23/2017  5:07 PM Patient's PCP: No Pcp Per Patient   Note Started: 04/23/2017 20:54 DOB: 03-Nov-1995         Chief Complaint   Patient presents with    Active Seizures     The history provided by the patient, EMS personnel and spouse.  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     Discharged yesterday. Recently admitted to MICU s/p intubation for pseudoseizures. Patient had ~17 seizures today per spouse and EMS.  Patient was provided 22.5 mg of Versed in route and 1 g of Keppra.  Patient life flighted.  Further history unable to be obtained given her status and active seizure.     A full history, including past medical, social, and family history (as detailed in this note), was reviewed and updated as necessary.      HISTORY:  No past medical history on file. Allergies not on file   No past surgical history on file. No current outpatient prescriptions on file.   Social History    Marital status: SINGLE              Spouse name:                       Years of education:                 Number of children:               Occupational History    None on file    Social History Main Topics    Smoking status: Not on file                          Smokeless status: Not on file                       Alcohol use: Not on file     Drug use: Not on file     Sexual activity: Not on file          Other Topics            Concern    None on file    Social History Narrative    None on file     No family history on file.        Review of Systems   Unable to perform ROS: Acuity of condition          TRIAGE VITAL SIGNS:  Temp: 37.5 C (99.5 F) (04/23/17 1739)  Temp src: Rectal (04/23/17 1739)  Pulse: 91 (04/23/17 1715)  BP: 93/61 (04/23/17 1715)  Resp: 21 (04/23/17 1715)  SpO2: 100 % (04/23/17 1715)  Weight: 40.5 kg (89  lb 4.6 oz) (04/23/17 1739)    Physical Exam   Constitutional: She is oriented to person, place, and time. No distress.   Chronically ill appearing female laying on ED gurney   HENT:   Head: Normocephalic and atraumatic.   Eyes: Conjunctivae and EOM are normal. Right eye exhibits no discharge. Left eye exhibits no discharge. No scleral icterus.   Neck: Normal range of motion. Neck supple.   Cardiovascular: Normal rate, regular rhythm, normal heart sounds and  intact distal pulses.  Exam reveals no gallop and no friction rub.    No murmur heard.  Pulmonary/Chest: Effort normal and breath sounds normal. No respiratory distress. She has no wheezes. She has no rales. She exhibits no tenderness.   Abdominal: Soft. Bowel sounds are normal. She exhibits no distension and no mass. There is no tenderness. There is no rebound and no guarding.   J tube   Musculoskeletal: Normal range of motion. She exhibits no edema, tenderness or deformity.   Lymphadenopathy:     She has no cervical adenopathy.   Neurological: She is alert and oriented to person, place, and time. No cranial nerve deficit. Coordination normal.   Neuro exam limited due to sedation. 5mm, reactive   Skin: Skin is warm and dry. No rash noted. She is not diaphoretic. No erythema. No pallor.   Nursing note and vitals reviewed.         INITIAL ASSESSMENT & PLAN, MEDICAL DECISION MAKING, ED COURSE  Chloe Barron 11Yd is a 21yr female who presents with a chief complaint of seizure.     Differential includes, but is not limited to: seizure, pseudoseizure, electrolyte abnormality     The results of the ED evaluation were notable for the following:    Pertinent lab results:     Lab Results - 24 hours (excluding micro and POC)   CBC WITH DIFFERENTIAL     Status: Abnormal   Result Value Status    White Blood Cell Count 16.4 (H) Final    Red Blood Cell Count 3.57 (L) Final    Hemoglobin 11.2 (L) Final    Hematocrit 32.7 (L) Final    MCV 91.5 Final    MCH 31.4 Final    MCHC 34.3  Final    RDW 13.9 Final    MPV 7.0 Final    Platelet Count 260 Final    Neutrophils % Auto 76.3 Final    Lymphocytes % Auto 12.5 Final    Monocytes % Auto 6.0 Final    Eosinophil % Auto 4.8 Final    Basophils % Auto 0.4 Final    Neutrophil Abs Auto 12.5 (H) Final    Lymphocyte Abs Auto 2.1 Final    Monocytes Abs Auto 1.0 (H) Final    Eosinophil Abs Auto 0.8 (H) Final    Basophils Abs Auto 0.1 Final   BASIC METABOLIC PANEL     Status: Abnormal   Result Value Status    Sodium 136 Final    Potassium 3.0 (L) Final    Chloride 105 Final    Carbon Dioxide Total 25 Final    Urea Nitrogen, Blood (BUN) 1 (L) Final    Glucose 82 Final    Calcium 8.3 (L) Final    Creatinine Serum 0.68 Final    E-GFR, African American >60 Final    E-GFR, Non-African American >60 Final   HEPATIC FUNCTION PANEL     Status: Abnormal   Result Value Status    Protein 5.5 (L) Final    Alkaline Phosphatase (ALP) 93 Final    Aspartate Transaminase (AST) 55 (H) Final    Bilirubin Total 0.4 Final    Alanine Transferase (ALT) 89 (H) Final    Bilirubin Direct 0.1 Final    Albumin 2.8 (L) Final   LIPASE     Status: Normal   Result Value Status    Lipase 17 Final   LACTIC ACID, WHOLE BLD VENOUS     Status: Normal   Result  Value Status    LACTIC ACID, WHOLE BLD VENOUS 1.2 Final   BLD GAS VENOUS     Status: Abnormal   Result Value Status    PATIENT TEMP, VEN  Final    PO2, VEN 145 (H) Final    O2 SAT, VEN 99 Final    PCO2, VEN 42 Final    pH, VEN 7.42 (H) Final    HCO3, VEN 27 Final    BASE EXCESS, VEN 3 (H) Final   VALPROATE     Status: Abnormal   Result Value Status    Valproate <10 (L) Final   CREATINE KINASE     Status: Normal   Result Value Status    Creatine Kinase 20 Final     Mild leukocytosis  LA WNL  Gas WNL    Pertinent imaging results (reviewed and interpreted independently by me):   CT HEAD WITHOUT CONTRAST  DX CHEST 1 VIEW    NO ICH    Radiology reads:   Ct Head Without Contrast    Result Date: 04/23/2017  CT HEAD WITHOUT CONTRAST EXAM DATE:  04/23/2017 8:14 PM COMPARISON: None INDICATION: Altered mental status TECHNIQUE: 2.5 mm axial images from skull base through vertex with sagittal and coronal reformatted images. DOSE REPORT: This study involved (1) CT acquisition(s).  The CTDIvol and DLP values are included below as required by state law: 1; Series: 2; Head; 16 cm; CTDIvol=66.2 mGy;  DLP 1209.2 mGy-cm For further information on CT radiation dose, see http://cook-fox.com/ FINDINGS: Brain: No evidence of hemorrhage, mass, shift, or extra axial fluid collection. The gray-white matter differentiation is maintained. The ventricles are normal in size and morphology. Bones and orbits: Normal. Soft tissues: Normal. Paranasal sinuses and mastoid air cells: Polypoid mucosal thickening left sphenoid sinus likely reflecting mucous retention cyst. Nasal sinuses are otherwise clear. Mastoid air cells well aerated bilaterally. IMPRESSION: 1. Normal CT of the head. No evidence of intracranial hemorrhage, mass effect, or extra-axial fluid collection. *THIS STUDY HAS NOT BEEN REVIEWED BY AN ATTENDING RADIOLOGIST* Preliminary Report Electronically Signed By: Blenda Bridegroom on 04/23/2017 8:49 PM    Dx Chest 1 View    Result Date: 04/23/2017  DX CHEST 1 VIEW EXAM DATE: 04/23/2017 6:29 PM COMPARISON: None. INDICATION: Signs/Symptoms:  seizure FINDINGS: Normal heart size. No focal consolidation or pulmonary edema. No pleural effusion or pneumothorax. The visualized upper abdomen is unremarkable. No acute osseous abnormality. IMPRESSION: 1. No acute cardiopulmonary disease. Final Report Electronically Signed By: Annalee Genta on 04/23/2017 7:14 PM      Consults:     Neuro -- Report pseudoseizures  GIM -- Too sedated for floor  MICU -- Admission  .    Medications   Lidocaine (XYLOCAINE) 10 mg/mL (1%) Injection 0.5-20 mL (not administered)   Heparin PF 5,000 units/0.5 mL Injection 5,000 Units (not administered)   Erythromycin (ERY-TAB) Delayed  Release Tablet 250 mg (not administered)   Fluoxetine (PROZAC) 20 mg/5 mL Solution 60 mg (not administered)   Hydrocortisone (SOLU-CORTEF) Injection 50 mg (not administered)   LevETIRAcetam (KEPPRA) 100 mg/mL Solution 80 mg (not administered)   Montelukast (SINGULAIR) Tablet 10 mg (not administered)   Pregabalin (LYRICA) Capsule 75 mg (not administered)   Promethazine (PHENERGAN) 6.25 mg/5 mL Syrup 25 mg (not administered)   FamoTIDine (PEPCID) Tablet 20 mg (not administered)   Trimethoprim 160 mg/Sulfamethoxazole 800 mg (BACTRIM DS) Tablet 1 tablet (not administered)   Valproate Sodium (DEPAKENE) 250 mg/5 mL Solution 250 mg (not administered)   D5 /  0.2% NaCl Infusion (not administered)   Potassium Chloride 10 % Liquid 10-60 mEq (not administered)   NaCl 0.9% Bolus 2,000 mL (0 mL IV Stopped 04/23/17 1932)   LORAZEPAM 2 MG/ML INJECTION SOLUTION (not administered)   Valproate Sodium (DEPACON) 500 mg in NaCl 0.9% 100 mL IVPB (0 mg IV Stopped 04/23/17 1915)   Lorazepam (ATIVAN) Injection 2 mg (2 mg IV Given 04/23/17 1721)     Chart Review: I reviewed the patient's prior medical records. Pertinent information that is relevant to this encounter --- Discharged yesterday.       PATIENT SUMMARY    21 year old female with history above who presents as 946 for seizure.  Vital signs within normal limits.  Examination nonfocal.  Began seizing on arrival.  Provided 22.5 mg of Versed in the field, 1 g of Keppra, an additional 2 mg of Ativan on arrival.  Neurology at bedside who determined the seizures were not epileptiform in nature given negative EEG and resolution of seizure activity with saline in the eyes.  LA WNL. Patient severely sedated from extensive benzos provided during prehospitalization and initially during ED course.  Internal medicine consulted for admission who felt patient was to unstable for floor and would require more close monitoring.  MICU consulted for admission.    LAST VITAL SIGNS:  Temp: 37.5 C (99.5 F)  (04/23/17 1739)  Temp src: Rectal (04/23/17 1739)  Pulse: 79 (04/23/17 1900)  BP: 92/52 (04/23/17 1900)  Resp: 19 (04/23/17 1900)  SpO2: 100 % (04/23/17 1900)  Weight: 40.5 kg (89 lb 4.6 oz) (04/23/17 1739)    Clinical Impression: Pseudoseizures, Benzo Exposure    Disposition: Admit    PATIENT'S GENERAL CONDITION:  Fair: Vital signs are stable and within normal limits. Patient is conscious but may be uncomfortable. Indicators are favorable.    Electronically signed by: Vernie Ammons, MD, Resident     ATTENDING PHYSICIAN CRITICAL CARE ADDENDUM  I, Misty Stanley, MD, spent a total of 35 minutes of critical care time personally managing the patient's critical injury or illness.  There is potential end-organ injury including central nervous system failure, respiratory failure and shock.  The patient's condition has required my direct bedside presence for the time noted above.  This time may include direct management, discussion with consultants, review of the patient's prior studies and medical record, interpretation of electronic data, serial bedside clinical reassessments, and discussion with family if patient was unable to make health care decisions, but excludes any procedures. This time is exclusive of any other attending critical care time.       This patient was seen, evaluated, and care plan was developed with the resident.  I agree with the findings and plan as outlined in our combined note. I personally independently visualized the images and tracings as noted above.      Misty Stanley, MD      Electronically signed by: Misty Stanley, MD, Attending Physician                      Is

## 2017-04-23 NOTE — ED Nursing Note (Signed)
PT SZ APPROX 45SEC

## 2017-04-23 NOTE — ED Nursing Note (Signed)
1714-2mg  of ativan given ivp

## 2017-04-24 ENCOUNTER — Telehealth: Payer: Self-pay | Admitting: Neurology

## 2017-04-24 ENCOUNTER — Telehealth: Payer: Self-pay | Admitting: RADIOLOGY

## 2017-04-24 DIAGNOSIS — Z434 Encounter for attention to other artificial openings of digestive tract: Secondary | ICD-10-CM

## 2017-04-24 LAB — URINALYSIS-COMPLETE
BILIRUBIN URINE: NEGATIVE
GLUCOSE URINE: NEGATIVE mg/dL
KETONES: NEGATIVE mg/dL
LEUK. ESTERASE: NEGATIVE
NITRITE URINE: NEGATIVE
OCCULT BLOOD URINE: NEGATIVE mg/dL
PH URINE: 7 (ref 4.8–7.8)
PROTEIN URINE: NEGATIVE mg/dL
SPECIFIC GRAVITY, URINE: 1.01 (ref 1.002–1.030)
SQUAMOUS EPI: 1 /HPF (ref 0–5)
UROBILINOGEN.: NEGATIVE mg/dL (ref ?–2.0)
WBC: 1 /HPF (ref 0–5)

## 2017-04-24 LAB — UR DRUGS OF ABUSE SCREEN
AMPHETAMINE SCREEN, URINE: NEGATIVE ng/mL
BARBITURATES SCREEN, URINE: NEGATIVE
BENZODIAZEPINES SCREEN, URINE: POSITIVE — AB
COCAINE METABOLITE SCRN, URINE: NEGATIVE
OPIATES SCREEN, URINE: NEGATIVE

## 2017-04-24 LAB — ELECTROCARDIOGRAM WITH RHYTHM STRIP: QTC: 457

## 2017-04-24 LAB — C DIFFICILE SURVEILLANCE TEST: TEST RESULT 2: POSITIVE — AB

## 2017-04-24 MED ORDER — ENOXAPARIN 30 MG/0.3 ML SUBCUTANEOUS SYRINGE
30.0000 mg | INJECTION | SUBCUTANEOUS | Status: DC
Start: 2017-04-25 — End: 2017-05-03
  Administered 2017-04-25 – 2017-05-03 (×7): 30 mg via SUBCUTANEOUS
  Filled 2017-04-24 (×8): qty 0.3

## 2017-04-24 MED ORDER — HYDROCODONE 7.5 MG-ACETAMINOPHEN 325 MG/15 ML ORAL SOLUTION
10.0000 mL | ORAL | Status: DC | PRN
Start: 2017-04-24 — End: 2017-05-03
  Administered 2017-04-24 – 2017-05-03 (×28): 10 mL via ORAL
  Filled 2017-04-24 (×28): qty 15

## 2017-04-24 MED ORDER — PROMETHAZINE 25 MG/ML INJECTION SOLUTION
12.5000 mg | Freq: Four times a day (QID) | INTRAMUSCULAR | Status: AC | PRN
Start: 2017-04-24 — End: 2017-04-25
  Administered 2017-04-24 – 2017-04-25 (×3): 12.5 mg via INTRAVENOUS
  Filled 2017-04-24: qty 1
  Filled 2017-04-24 (×2): qty 0.5

## 2017-04-24 NOTE — Telephone Encounter (Signed)
Post-discharge follow up Phone Call    Provider: Dr. Richarda Blade Ng    Patient s/p status epilepticus, discharge 04/22/17.     Received an alert from support at [facility] for [facility] Discharge Call Program that a response was received to the automated call indicating a question/concern related to the following:  - Discharge instructions: does not recall    Attempted to contact patient, unable to reach. Spoke woman who self-identifies as future mother-in-law, states patient is not available as she is back in the ICU here at [facility]. Noted chart merge not yet complete, current record for patient may be documented in separate area at this time.    [facility] to re-contact at discharge following current admission. Await identification of any further needs at that time.    Catha Gosselin, RN  Post-discharge Phone Call Program    [phone] - Post-discharge call department

## 2017-04-24 NOTE — Plan of Care (Addendum)
Problem: Patient Care Overview (Adult)  Goal: Plan of Care Review  Outcome: Ongoing (interventions implemented as appropriate)  Goal Outcome Evaluation Note    Chloe Barron 11Yd is a 79yr female admitted 04/23/2017    OUTCOME SUMMARY AND PLAN MOVING FORWARD:   Pt is on RA with VSS.  Pt c/o of pain at PEG site.  MD ordered hycet for pain.  Hole in tube of external tube of PEG.  MD aware.  MICU B team instructed to cover hole and able to give meds through PEG tube.  Plan is to replace PEG tube.  Pt had 2 episodes of shaking arms and unresponsiveness. First one last about 1.5 min and second one lasted 45 seconds.  MICU B team aware. MICU B team came to assess pt.  MICU B team informed RN to not give medication and attempt sternal rub and NS eye drops in eyes.  No new orders.  Will continue to monitor pt.         Goal: Individualization and Mutuality  Outcome: Ongoing (interventions implemented as appropriate)      Problem: Sleep Pattern Disturbance (Adult)  Goal: Identify Related Risk Factors and Signs and Symptoms  Related risk factors and signs and symptoms are identified upon initiation of Human Response Clinical Practice Guideline (CPG)   Outcome: Ongoing (interventions implemented as appropriate)    Goal: Adequate Sleep/Rest  Patient will demonstrate the desired outcomes by discharge/transition of care.   Outcome: Ongoing (interventions implemented as appropriate)      Problem: Pain, Acute (Adult)  Goal: Identify Related Risk Factors and Signs and Symptoms  Related risk factors and signs and symptoms are identified upon initiation of Human Response Clinical Practice Guideline (CPG)   Outcome: Ongoing (interventions implemented as appropriate)    Goal: Acceptable Pain Control/Comfort Level  Patient will demonstrate the desired outcomes by discharge/transition of care.   Outcome: Ongoing (interventions implemented as appropriate)      Problem: Pressure Ulcer Risk (Braden Scale) (Adult,Obstetrics,Pediatric)  Goal:  Identify Related Risk Factors and Signs and Symptoms  Related risk factors and signs and symptoms are identified upon initiation of Human Response Clinical Practice Guideline (CPG)   Outcome: Ongoing (interventions implemented as appropriate)    Goal: Skin Integrity  Patient will demonstrate the desired outcomes by discharge/transition of care.   Outcome: Ongoing (interventions implemented as appropriate)

## 2017-04-24 NOTE — Nurse Transfer Note (Signed)
TRANSFER NOTE - SENDING    Note Started: 04/24/2017, 22:57     Report given to Molly Maduro, RN. Patient transferred at 2315 hours to D12 unit by bed.  Pt condition stable.  Patient's belonging's with patient. Tyden Kann Lavell Luster, RN

## 2017-04-24 NOTE — MD Query (Addendum)
MEDICAL RECORD INPATIENT CODING UNIT  CLINICAL DOCUMENTATION IMPROVEMENT REQUEST    Patient Name:   Chloe Barron 11Yd  Acct:     MRN: [id number]  Admit Date: 04/23/2017   Physician: Dr. Clearance Coots,      Additional documentation/clarification is needed to ensure that the severity of illness, risk of mortality, and DRG are accurate for your patient's encounter.  The following clinical information needs clarification.  Please see my question(s) below.  To respond, addend and sign this query.  Your addendum and signature will be filed as a permanent note in the patient's legal medical record.      Question :  The medical record reflects the following clinical findings, treatment, and risk factors.  Clinical Findings: H&P describes," The patient is critically ill and requiring ICU level of care due to: Somnolence 2/2 seizure treatment, ROS:  Unable to obtain given somnolensce, Hyperammonemia Likely 2/2 valproate use".  Risk Factors: As above.  Evaluation/ Treatment: 1L bolus fluids,Monitor in ICU for metabolism of benzodiazapines, NPO ,Ct Head .  Based on the information described above, is there a corresponding/additional diagnosis such as:   1. Transient toxic metabolic encephalopathy associated with benzodiazapines, seizure treatment.  2. Other explanation or diagnosis supported by the clinical findings (please provide)  3. Unable to determine a diagnosis (no explanation for clinical findings)    Answer :  Transient toxic metabolic encephalopathy associated with benzodiazapines, seizure treatment.  Foye Clock, MD    Thank you,    [facility]  Clinical Documentation Specialist  Health Information Management (HIM) Division  Value Department  [phone]
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
## 2017-04-24 NOTE — Telephone Encounter (Signed)
Received a call from a gal who claims she is patients cousin but goes not know patients last name or D.O.B unable to look up patient at time and gal is not listed in patients chart. She will try to contact patient and call back or ask patient to call
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
## 2017-04-24 NOTE — Nurse Assessment (Signed)
ASSESSMENT NOTE    Note Started: 04/24/2017, 23:25     Initial assessment completed and recorded in EMR.  Report received from E4 night shift nurse and orders reviewed. Plan of Care reviewed and updated, discussed with patient.  Wayne Both, RN

## 2017-04-24 NOTE — Telephone Encounter (Signed)
Contact and left message with patient cousin, Jeanice Lim, for patient to call office back at [phone] option [phone].    Please schedule patient for IR JEJUNOSTOMY TUBE CHANGE    Marijean Niemann  [facility]  Interventional Radiology

## 2017-04-24 NOTE — Nurse Assessment (Signed)
MICU RN Transfer Out Note     Report given to E4 RN by Blaine Hamper, RN before change of shift. Chloe Barron 11Yd transferred from MICU to E4 at 20:30 hours. Patient condition stable, transported in gurney with side rails up and accompanied by transport. Transfer orders released: yes. Patch/ointment: absent.    Belongings Assessment  Belongings with patient. List all belongings sent with patient on transfer out: kindle tablet, teddie bears X2, clothing (underwear, top & lounge bottoms) & personal blanket.    Last Recorded Vital Signs  Temp: (!) 35.6 C (96.1 F), BP: 108/66, Pulse: 76, Resp: 17, SpO2: 98 %    Chloe Ching Neel-Cooley, RN  04/24/2017, 20:32

## 2017-04-24 NOTE — Transfer Summaries (Addendum)
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 Stable on monitoring overnight. Pseudoseizures evaluated by [facility] Neurology and multiple EEGs at outside facilities; no new intervention needed. J tube with minor hole in tubing - to be evaluated by ACS. Stabile for downgrade    24 Hour Anticipatory Guidance (e.g. key pending labs,  procedures, consults)   - Awareness of pseudoseizures; per neuro, does not need any new interventions.  - J tube; ACS will follow-up with recommendations regarding hole in tubing outside of skin.    Follow Up Needed After Hospital Discharge (Imaging, Procedures, etc.)     - Follow-up with Neurologist regarding pseudoseizures and treatment  - Establish care with new PCP once established where patient is going to live    ICU Course    Problem 1 Somnolence secondary to benzodiazepine   Admitted for monitoring of somnolence given. Patient given 24mg , of versed by EMS, and 2mg  by ativan in the field. Patient was arousable, protecting airway, and not requiring intubation. Patient watched overnight, and did not have any residual effects of benzodiazipene dosing, and stable for downgrade transfer.    Problem 2 Jejunostomy tube dysfunction  J tube placed by [facility] at [facility] on 04/12/17. Patient stated that three days ago, playing with her hamster, the animal bit a portion of the J tube. Seen by ACS for possible J tube replacement. Given timeline and malnourishment, did not recommend full exchange. ACS contacted IR for possible patch vs adapter placement. Needs to be continued to monitored; stable for floor.    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 Seen by [facility] Neurology who recommended no intervention at this time. Seen by  multiple outside neurology providers (see chart), with EEGs completed. If concerned about continuing pseudoseizures, can consider reaching back out to Neurology. Continue on home medications.    Procedures in the ICU with Dates (include intubation/extubation, lines, etc.)      None    Transfer Exam    Vital Signs Temp: 35.8 C (96.4 F) (08/27 1200)  Temp src: Skin (08/27 1200)  Pulse: 67 (08/27 1200)  BP: 91/58 (08/27 1200)  Resp: 17 (08/27 1200)  SpO2: 98 % (08/27 1200)  Height: 152.4 cm (5') (08/26 2130)  Weight: 39.1 kg (86 lb 3.2 oz) (08/26 2130)    Physical Exam  General appearance: Alert, awake, aware. Appears comfortable lying in bed. Thin   HEENT: NC/AT. EOMI. PERRL. MMM.  CV: RRR; no m/r/g. Normal S1/S2   Resp: CTAB. No wheezes, rales, ronchi.  Normal respiratory effort.  GI: Soft, non-distended. Mild tenderness around J tube site in RUQ. No associated erythema or drainage. Small opening in top of tube 2cm distal to med port site.   Extremities: No c/c/e in all 4 extremities. Warm and well perfused.Radial pulses 2+ bilaterally. DP pulses 2+ bilaterally. Laparscopic incisions healing.  Neuro: CN II-XII grossly intact; no focal deficits when not pseudoseizing. Strength and sensation to light touch intact grossly throughout.  Mental Status: AOx4.  Goal oriented thought process.      Remaining Indwelling Lines and Tubes when Leaving ICU   - J tube, PIV     The following imaging and laboratory studies were independently reviewed by me:    CBC  Recent labs for the  past 72 hours     04/23/17 2217 04/23/17 1722    WHITE BLOOD CELL COUNT 13.0 16.4*    HEMOGLOBIN 10.2* 11.2*    HEMATOCRIT 29.7* 32.7*    MCV 92.0 91.5    PLATELET COUNT 229 260      CHEMISTRY  Recent labs for the past 72 hours     04/23/17 2217 04/23/17 1722    SODIUM 140 136    POTASSIUM 3.2* 3.0*    CHLORIDE 111* 105    CARBON DIOXIDE TOTAL 25 25    CREATININE BLOOD 0.66 0.68    UREA NITROGEN, BLOOD (BUN) &lt;1* 1*    GLUCOSE 83 82     CALCIUM 8.2* 8.3*    PHOSPHORUS (PO4) 4.3 --    MAGNESIUM (MG) 1.6 --          No results found for this basename: INR:* in the last 72 hours     MICROBIOLOGY:  None    IMAGING:  CT Head: IMPRESSION:    No acute intracranial hemorrhage or mass effect.    Chest X-Ray:  IMPRESSION:  1. No acute cardiopulmonary disease.    Meds:  Scheduled Medications  Erythromycin (ERY-TAB) Delayed Release Tablet 250 mg, ORAL, TID w/ meals  FamoTIDine (PEPCID) Tablet 20 mg, ORAL, BID  Fluoxetine (PROZAC) 20 mg/5 mL Solution 60 mg, ORAL, QAM  Heparin PF 5,000 units/0.5 mL Injection 5,000 Units, SUBCUTANEOUS, Q12H  LevETIRAcetam (KEPPRA) 100 mg/mL Solution 750 mg, ORAL, BID  Montelukast (SINGULAIR) Tablet 10 mg, ORAL, Daily Bedtime  Pregabalin (LYRICA) Capsule 75 mg, ORAL, TID  Valproate Sodium (DEPAKENE) 250 mg/5 mL Solution 250 mg, ORAL, BID        IV Medications  D5 / 0.2% NaCl, , IV, CONTINUOUS, Last Rate: 50 mL/hr at 04/24/17 1100        PRN Medications  Hydrocodone 7.5 mg-Acetaminophen 325 mg/15 mL (HYCET) Solution 10 mL, ORAL, Q4H PRN  Lidocaine (XYLOCAINE) 10 mg/mL (1%) Injection 0.5-20 mL, INFILTRATION, PRN  Promethazine (PHENERGAN) 6.25 mg/5 mL Syrup 25 mg, ORAL, Q4H PRN  Promethazine (PHENERGAN) Injection 12.5 mg, IV, Q6H PRN        Daytime MICU Fellow Contact Info:  Name:Andrew Matthys  Pager: [phone]    Cliffton Asters. Hillis Range", MD  MICU B  PGY1, Family and Community Medicine  Pager: [phone]  PI#: [phone]    This patient was seen, evaluated, and care plan was developed with the resident.  I agree with the assessment and plan as outlined in the resident's note.      Report electronically signed by Foye Clock, MD. Attending

## 2017-04-24 NOTE — Nurse Assessment (Signed)
TRANSFER NOTE - RECEIVING    Note Started: 04/24/2017, 21:15     Report received from [place], [place]. Patient received at 2038 hours from T5 unit by gurney. Pt condition stable . Patient oriented to room and unit. Plan of care reviewed and updated. Patient awake, alert, and oriented laying in bed resting. Vitals stable. C/O J-tube site pain, but declined PRN for pain at this time. Otherwise, no distress noted. Call light within reach. Will continue to monitor. Geraline Halberstadt Lavell Luster, RN

## 2017-04-24 NOTE — Nurse Assessment (Addendum)
MICU RN Assessment Note     Bedside report conducted with night shift nurse and orders reviewed. Patient condition fair. Plan of Care reviewed and updated, discussed with patient and family. Initial assessment completed and recorded in EMR. Bed locked and low, alarms audible and appropriate, emergency equipment at bedside, and call light in reach.    Patient resting in bed, fiance at bedside. SR on cardiac monitor, BP low but at patient's baseline. Easily arousable, slightly drowsy, oriented, speaks clearly. Reports pain at [facility] site. Dressing shows small amount of purulent drainage with dried blood. J tube with 60mm x 79mm hole at site below where medications can be infused, tube clamped below hole to prevent contents from leaking.     Plan: F/U with GI for recommendations on PEJ hole, monitor seizure activity and neuro status, monitor Hgb.     Hospital Course  21 year old female w/ CC of seizures found to have pseudoseizures admitted to MICU for observation given significant benzodiazapenes given resulting in somnolesence (08/27 0708)    Date of Admission: 04/23/2017  Service: (A) MICU    Ozzie Hoyle, RN  04/24/2017, 09:02

## 2017-04-24 NOTE — Nurse Transfer Note (Addendum)
MICU RN Transfer Out Note     Report given to E4 RN Myischia @ 19:30.    Last Recorded Vital Signs  Temp: (!) 35.6 C (96.1 F) skin, BP: 108/66, Pulse: 76, Resp: 17, SpO2: 98 % RA    Ozzie Hoyle, RN  04/24/2017, 19:34

## 2017-04-24 NOTE — Nurse Assessment (Signed)
ASSESSMENT NOTE    Note Started: 04/24/2017, 19:45     Initial assessment completed and recorded in EMR.  Report received from day shift nurse and orders reviewed. Plan of Care reviewed and updated, discussed with patient. Pt c/o nausea- 'phenergen' administered. Pt has mashed potatoes delivered and awaiting transport to take to med-surg bed. Pt aware of plan to move units- Will continue to monitor pt.  Luddie Boghosian Neel-Cooley, RN

## 2017-04-24 NOTE — Nurse Focus (Addendum)
MICU RN Focus Note     Summary of Events  At 9:50, patient's fiance called RN and reported patient having seizure. Patient eyes turned up, blinking. Arms at side, stiff, fists clenched. Arms and feet bilateral twitching. PERL. Does not make eye contact when name called. Sternal rub and NS instilled in eyes, but movements continued. MICU team B resident called to bedside and assessed patient. Reported to monitor patient without additional intervention. Lasted approximately 5 minutes in duration with additional tremors intermittently. No change in VS observed. At 10:15, patient back to baseline - talking, responsive to commands, opening eyes spontaneously.     MICU B team notified: yes, provider at bedside  and preexisting orders implemented.    Ozzie Hoyle, RN  04/24/2017, 12:52

## 2017-04-25 ENCOUNTER — Other Ambulatory Visit: Payer: Self-pay | Admitting: VASC/INTRVN RADIOLOG

## 2017-04-25 ENCOUNTER — Inpatient Hospital Stay: Payer: MEDICAID

## 2017-04-25 ENCOUNTER — Inpatient Hospital Stay (HOSPITAL_COMMUNITY): Payer: MEDICAID

## 2017-04-25 DIAGNOSIS — K3184 Gastroparesis: Secondary | ICD-10-CM

## 2017-04-25 DIAGNOSIS — Z434 Encounter for attention to other artificial openings of digestive tract: Secondary | ICD-10-CM

## 2017-04-25 DIAGNOSIS — K9419 Other complications of enterostomy: Secondary | ICD-10-CM

## 2017-04-25 DIAGNOSIS — D72829 Elevated white blood cell count, unspecified: Secondary | ICD-10-CM

## 2017-04-25 LAB — CULTURE SURVEILLANCE, MRSA

## 2017-04-25 MED ORDER — MAGNESIUM SULFATE 2 GRAM/50 ML (4 %) IN WATER INTRAVENOUS PIGGYBACK
2.0000 g | INJECTION | Freq: Once | INTRAVENOUS | Status: DC
Start: 2017-04-25 — End: 2017-04-25
  Filled 2017-04-25: qty 50

## 2017-04-25 MED ORDER — LORAZEPAM 1 MG TABLET
1.0000 mg | ORAL_TABLET | Freq: Once | ORAL | Status: AC
Start: 2017-04-25 — End: 2017-04-25
  Administered 2017-04-25: 2 mg via SUBLINGUAL
  Filled 2017-04-25: qty 2

## 2017-04-25 MED ORDER — IOHEXOL 300 MG IODINE/ML INJECTION FOR ORAL
5.0000 mL | Status: DC
Start: 2017-04-25 — End: 2017-05-03

## 2017-04-25 MED ORDER — REMOVE LIDOCAINE CREAM
Status: DC | PRN
Start: 2017-04-25 — End: 2017-05-03
  Filled 2017-04-25: qty 1

## 2017-04-25 MED ORDER — ONDANSETRON HCL (PF) 4 MG/2 ML INJECTION SOLUTION
4.0000 mg | Freq: Three times a day (TID) | INTRAMUSCULAR | Status: DC | PRN
Start: 2017-04-25 — End: 2017-05-03
  Administered 2017-04-25 – 2017-05-03 (×13): 4 mg via INTRAVENOUS
  Filled 2017-04-25 (×13): qty 2

## 2017-04-25 MED ORDER — MAGNESIUM OXIDE 400 MG (241.3 MG MAGNESIUM) TABLET
400.0000 mg | ORAL_TABLET | Freq: Once | ORAL | Status: AC
Start: 2017-04-25 — End: 2017-04-25
  Administered 2017-04-25: 400 mg via ORAL
  Filled 2017-04-25: qty 1

## 2017-04-25 MED ORDER — LACTATED RINGERS IV INFUSION
INTRAVENOUS | Status: AC
Start: 2017-04-25 — End: 2017-04-26
  Administered 2017-04-25 – 2017-04-26 (×2): via INTRAVENOUS

## 2017-04-25 MED ORDER — HEPARIN, PORCINE (PF) 5,000 UNIT/0.5 ML INJECTION SYRINGE
5000.0000 [IU] | INJECTION | Freq: Two times a day (BID) | INTRAMUSCULAR | Status: DC
Start: 2017-04-26 — End: 2017-04-25

## 2017-04-25 MED ORDER — POTASSIUM CHLORIDE ER 20 MEQ TABLET,EXTENDED RELEASE(PART/CRYST)
60.0000 meq | EXTENDED_RELEASE_TABLET | Freq: Once | ORAL | Status: AC
Start: 2017-04-25 — End: 2017-04-25
  Administered 2017-04-25: 60 meq via ORAL
  Filled 2017-04-25: qty 3

## 2017-04-25 MED ORDER — PROMETHAZINE 25 MG/ML INJECTION SOLUTION
12.5000 mg | Freq: Four times a day (QID) | INTRAMUSCULAR | Status: DC | PRN
Start: 2017-04-25 — End: 2017-05-03
  Administered 2017-04-25 – 2017-05-03 (×18): 12.5 mg via INTRAVENOUS
  Filled 2017-04-25 (×18): qty 1

## 2017-04-25 MED ORDER — MAGNESIUM OXIDE 400 MG (241.3 MG MAGNESIUM) TABLET
800.0000 mg | ORAL_TABLET | Freq: Once | ORAL | Status: DC
Start: 2017-04-25 — End: 2017-04-25

## 2017-04-25 MED ORDER — LIDOCAINE 4 % TOPICAL CREAM
TOPICAL_CREAM | TOPICAL | Status: DC | PRN
Start: 2017-04-25 — End: 2017-05-03

## 2017-04-25 MED ORDER — LIDOCAINE HCL 10 MG/ML (1 %) INJECTION SOLUTION
0.1000 mL | INTRAMUSCULAR | Status: DC | PRN
Start: 2017-04-25 — End: 2017-05-03

## 2017-04-25 NOTE — Allied Health Progress (Signed)
Physical Therapy Progress Note    Date of Service: 04/25/2017   Time in: 14:06  Total time: 0 Minutes    Pt presently having a seizure - Hold PT at this time.    Casimiro Needle Vir Whetstine PT [phone]  04/25/2017  Vocera [phone]

## 2017-04-25 NOTE — Nurse Focus (Signed)
IR MD in to assess pt's J-tube.  Per IR MD, pt will probably need to go back to IR and "have J-tube re-assessed and unclogged."  Hold tube feeds for now.

## 2017-04-25 NOTE — Nurse Focus (Signed)
Made MD aware that J-tube is not patent and IR resident will be up to assess J-tube prior to starting tube feeds.

## 2017-04-25 NOTE — Nurse Focus (Signed)
Pt came out of bathroom sat on the floor.  Rn asked pt if she was ok.  Pt stated she felt like a seizure was coming on.  At approximately 1559 pt appeared to have seizure activity with arms, hands, and legs stiffening, eyes having repetitive blinking and eyes looking upward. As soon as seizure activity started RN safely positioned  pt on her side with pillow underneath her head.  Monitored seizure activity lasting approximately 8 minutes.  VS obtained and stable. MD was paged and came to bedside as seizure activity was happening.  Pt became conscious and was aware she just had a seizure.  Pt placed back to bed safely by lift team.  Will continue to monitor.

## 2017-04-25 NOTE — Nurse Assessment (Signed)
ASSESSMENT NOTE    Note Started: 04/25/2017, 06:47     Initial assessment completed and recorded in EMR.  Report received from night shift nurse and orders reviewed. Plan of Care reviewed and updated and appropriate, discussed with patient.  Encouraged patient to utilize call light when assistance is needed.  Patient verbalized understanding and is able to demonstrate use of call light when assistance is needed.  Belongings and call light within reach.  Will continue to monitor.  Jerilee Hoh, RN

## 2017-04-25 NOTE — Nurse Focus (Signed)
Patient had 7.5 min of seizure activity.  Vital signs stable; no respiratory distress noted. Oriented with time, place and situation after the seizure.  Notified Dr. Rober Minion [phone]) of the event. Continued to monitor patient's condition.

## 2017-04-25 NOTE — Nurse Focus (Signed)
Notified IR that pt's J-tube not patent.  Per IR will send IR resident to assess J-tube.

## 2017-04-25 NOTE — Plan of Care (Signed)
Problem: Patient Care Overview (Adult)  Goal: Plan of Care Review  Outcome: Ongoing (interventions implemented as appropriate)  Goal Outcome Evaluation Note    Chloe Barron 11Yd is a 31yrfemale admitted 04/23/2017    OUTCOME SUMMARY AND PLAN MOVING FORWARD:   Stable. AAOx3. No seizure activity noted. No N/V noted. Loose BM x 1; diuresing well; up with assist. Plan for J tube replacement.     04/25/17 0315   OTHER   Plan Of Care Reviewed With patient   Plan of Care Review   Progress progress toward functional goals as expected     Goal: Individualization and Mutuality  Outcome: Ongoing (interventions implemented as appropriate)    Goal: Discharge Needs Assessment  Outcome: Ongoing (interventions implemented as appropriate)      Problem: Sleep Pattern Disturbance (Adult)  Goal: Identify Related Risk Factors and Signs and Symptoms  Related risk factors and signs and symptoms are identified upon initiation of Human Response Clinical Practice Guideline (CPG)   Outcome: Outcome(s) achieved Date Met: 04/25/17   04/25/17 0315   Sleep Pattern Disturbance   Sleep Pattern Disturbance: Related Risk Factors hospital routine/care;noise/lighting/odors;unfamiliar surroundings   Signs and Symptoms (Sleep Pattern Disturbance) difficulty performing routine tasks     Goal: Adequate Sleep/Rest  Patient will demonstrate the desired outcomes by discharge/transition of care.   Outcome: Ongoing (interventions implemented as appropriate)   04/25/17 0315   Sleep Pattern Disturbance (Adult)   Adequate Sleep/Rest making progress toward outcome       Problem: Pressure Ulcer Risk (Braden Scale) (Adult,Obstetrics,Pediatric)  Goal: Identify Related Risk Factors and Signs and Symptoms  Related risk factors and signs and symptoms are identified upon initiation of Human Response Clinical Practice Guideline (CPG)    04/25/17 0315   Pressure Ulcer Risk (Braden Scale)   Related Risk Factors (Pressure Ulcer Risk (Braden Scale))  excretions/secretions;mechanical forces;mobility impaired;nutritional deficiencies     Goal: Skin Integrity  Patient will demonstrate the desired outcomes by discharge/transition of care.   Outcome: Ongoing (interventions implemented as appropriate)   04/25/17 0315   Pressure Ulcer Risk (Braden Scale) (Adult,Obstetrics,Pediatric)   Skin Integrity making progress toward outcome       Problem: Pain, Acute (Adult)  Goal: Identify Related Risk Factors and Signs and Symptoms  Related risk factors and signs and symptoms are identified upon initiation of Human Response Clinical Practice Guideline (CPG)   Outcome: Outcome(s) achieved Date Met: 04/25/17   04/25/17 0315   Pain, Acute   Related Risk Factors (Acute Pain) positioning;procedure/treatment   Signs and Symptoms (Acute Pain) facial mask of pain/grimace;verbalization of pain descriptors     Goal: Acceptable Pain Control/Comfort Level  Patient will demonstrate the desired outcomes by discharge/transition of care.   Outcome: Ongoing (interventions implemented as appropriate)   04/25/17 0315   Pain, Acute (Adult)   Acceptable Pain Control/Comfort Level making progress toward outcome       Problem: Pressure Ulcer Risk (Braden Scale) (Adult,Obstetrics,Pediatric)  Goal: Identify Related Risk Factors and Signs and Symptoms  Related risk factors and signs and symptoms are identified upon initiation of Human Response Clinical Practice Guideline (CPG)   Outcome: Outcome(s) achieved Date Met: 04/25/17   04/25/17 0315   Pressure Ulcer Risk (Braden Scale)   Related Risk Factors (Pressure Ulcer Risk (Braden Scale)) excretions/secretions;mechanical forces;mobility impaired;nutritional deficiencies     Goal: Skin Integrity  Patient will demonstrate the desired outcomes by discharge/transition of care.   Outcome: Ongoing (interventions implemented as appropriate)   04/25/17 0315   Pressure  Ulcer Risk (Braden Scale) (Adult,Obstetrics,Pediatric)   Skin Integrity making progress toward  outcome       Problem: Fall Risk (Adult)  Goal: Identify Related Risk Factors and Signs and Symptoms  Related risk factors and signs and symptoms are identified upon initiation of Human Response Clinical Practice Guideline (CPG)   Outcome: Outcome(s) achieved Date Met: 04/25/17   04/25/17 0315   Fall Risk   Fall Risk: Related Risk Factors fatigue/slow reaction;environment unfamiliar   Fall Risk: Signs and Symptoms presence of risk factors

## 2017-04-25 NOTE — Nurse Consult (Signed)
MIDLINE PROCEDURE NOTE   Note started: 04/25/2017 15:46    LOS:   LOS: 2 days    DOB: [birth year]    Location:  D12       PERIPHERAL MIDLINE CATHETER - POWER RATED Catheter: (Must have POSITIVE blood return for contrast dye study-  If blood return NOT present, new vascular access would be needed).    Newly placed midline on right arm in the basilic vein.    Type of Catheter: PowerGlide Pro Midline Catheter  Size of Vessel: 4.49m   PowerGlide would occupy: 23% of vein  * Size: 20G     Length: 8cm   MAC: 20/18  Lot #: [id number]   Please treat midline as standard peripheral IV.     Please obtain NEW TUBING INFUSION SET to initiate infusion in midline.     NO BLOOD PRESSURE ON ARM WHERE MIDLINE IS PLACED. If necessary, the forearm (below the midline) is acceptable.     For Drawing labs:   - Flush with 118mof NS before drawing blood.   - AFTER BLOOD DRAW FLUSH WITH 2072mNS.    - Midline catheter without positive blood return may still be used for infusion as a peripheral.       Dressing changes are done by Primary Nursing Staff.   Please change dressing every Q7 days (See Policy [id number]    - - - - -- - - - - - - - - - - - - - - - - - - - - - - - - - - - - - - - - - - - - - - - - - - - - - - - - - - - -       This is a peripheral IV and should only be used for infusates that are compatible with a peripheral infusion.  See Policy [id number] care and maintenance instructions.  Do NOT use this catheter for medications that require central access such as: TPN, pressors, vesicants or medications with mOSM >900.      Midlines may remain indwelling for up to 29 days or until no longer indicated or symptomatic  (i.e. pain, redness, swelling, and/or phlebitis).     Completed by:  [facility]insons[facility]  PICC SER[place] Parkager #[phone]

## 2017-04-25 NOTE — Progress Notes (Addendum)
[facility] HOSPITALIST - INTERNAL MEDICINE DAILY PROGRESS NOTE    Patient: Chloe Barron 21Yd 8yr female Date of Admission: 04/23/2017   MRN: [id number] Length of Stay: . LOS: 2 days    Room: 12759/127591 Date of Service: 04/25/2017     24 EVENTS / SUBJECTIVE:  Pt lost 2 pIV placed in MICU and unable to replace despite multiple attempts.    No further spells during hospitalization. Neuro recommended not treating the pseudoseizures with benzos. NS eye drop was tried in ED and pt stopped having the spell. J tube changed by IR today. Pt endorsed 10 day hx of diarrhea. No recent Abx but reported hx of C.diff X2 in the past. Chronic nausea/abd discomfort. Pt is followed by [place] [facility] regarding ongoing weight loss. Otherwise, no fever, chills, urinary sxs, URI sxs.     ROS: as above    OBJECTIVE:    Scheduled Medications:    Current Facility-Administered Medications:  Enoxaparin (LOVENOX) Injection 30 mg SUBCUTANEOUS Q24H   Erythromycin (ERY-TAB) Delayed Release Tablet 250 mg ORAL TID w/ meals   FamoTIDine (PEPCID) Tablet 20 mg ORAL BID   Fluoxetine (PROZAC) 20 mg/5 mL Solution 60 mg ORAL QAM   LevETIRAcetam (KEPPRA) 100 mg/mL Solution 750 mg ORAL BID   Magnesium Sulfate 2 grams in Sterile Water 50 mL IVPB IV ONCE   Montelukast (SINGULAIR) Tablet 10 mg ORAL Daily Bedtime   Potassium Chloride (KLOR-CON M20) SR Tablet 60 mEq ORAL ONCE   Pregabalin (LYRICA) Capsule 75 mg ORAL TID   Valproate Sodium (DEPAKENE) 250 mg/5 mL Solution 250 mg ORAL BID     Continuous Medications:    D5 / 0.2% NaCl  Last Rate: 50 mL/hr at 04/25/17 0028     PRN Medications:  Hydrocodone 7.5 mg-Acetaminophen 325 mg/15 mL (HYCET) Solution 10 mL, ORAL, Q4H PRN  Lidocaine (XYLOCAINE) 10 mg/mL (1%) Injection 0.5-20 mL, INFILTRATION, PRN  Promethazine (PHENERGAN) 6.25 mg/5 mL Syrup 25 mg, ORAL, Q4H PRN        Intake and Output  Last Two Completed Shifts  In: 1378.3 [Crystalloid:1198.3; Irrigant:180]  Out: 1250 [Urine:1250]    Current Shift       PHYSICAL EXAM  Gral:  cachetic young female with sunken eyes  HEENT: PERRL, EOMI, op clear  Neck: supple  Chest: CTAB, nl resp effort  CV: RRR, no m/r/g  Abd: soft, J tube site c/d/i, no sig tenderness to palpation, +BS  Ext: no edema  Neuro: AAOX3, non focal    CURRENT VITALS      MIN - MAX  BP: 102/48      BP: (82-125)/(47-90)   Pulse: 64      Pulse Min: 60 Max: 86  Resp: 16      Resp Min: 12 Max: 25  SpO2: 96 %      SpO2 Min: 95 % Max: 100 %  Temp: 36.3 C (97.3 F)      Temp Min: 35.1 C (95.2 F) Max: 36.7 C (98.1 F)  Weight: 39.1 kg (86 lb 3.2 oz)  SpO2: 96 %  Flow (L/min): 15  Pulse: 64    Lines & Drains: none    LABS:    CBC  Recent labs for the past 72 hours     04/23/17 2217 04/23/17 1722    WHITE BLOOD CELL COUNT 13.0 16.4*    HEMOGLOBIN 10.2* 11.2*    HEMATOCRIT 29.7* 32.7*    MCV 92.0 91.5    PLATELET COUNT 229 260  CHEMISTRY  Recent labs for the past 72 hours     04/23/17 2217 04/23/17 1722    SODIUM 140 136    POTASSIUM 3.2* 3.0*    CHLORIDE 111* 105    CARBON DIOXIDE TOTAL 25 25    CREATININE BLOOD 0.66 0.68    UREA NITROGEN, BLOOD (BUN) &lt;1* 1*    GLUCOSE 83 82    CALCIUM 8.2* 8.3*    PHOSPHORUS (PO4) 4.3 --    MAGNESIUM (MG) 1.6 --          No results found for this basename: INR:* in the last 72 hours     MICROBIOLOGY:  UA neg    IMAGING  CXR -   1. No acute cardiopulmonary disease.    NCHCT  No acute intracranial hemorrhage or mass effect.    J tube replacement 8/28  A new 12 F x 25 cm (cut to fit) J tube replaced the stoma measurement device that was placed at the jejunostomy tube.     OSH EEG (04/17/17)  IMPRESSION:This is an abnormal EEG due to the presence of   excessive fast activity as well as some underlying slowing.     ASSESSMENT AND PLAN     21yo female with MMP including hx of recurrent admissions with pseudoseizure, diamond-blackckfan anemia s/p BMT, chronic GVHD, SMA syndrome, gastroparesis, severe malnutrition s/p J tube placement 8/15, PTSD presenting with somnolence after pt  was given high doses of benzo due to concern for possible seizure. Pt was initially admitted to MICU for observation and transferred to the floor on 8/28. Pt also with leaking J tube due to animal bite.     # Somnolence due to benzos. Pt had 24mg  of versed by EMS and 2mg  of Ativan in ED  # Pseudoseizure. Pt with multiple hospitalizations and prior neuro eval with EEG. NCHCT neg  - pt with neg EEG in the past but pt is still on keppra and valproic acid  - neuro recommends against treating the pseudoseizure. Trial of NS eye drops.     # Leukocytosis  # Diarrhea. abd exam is benign  WBC trending down with IVF. Afebrile. Neg UA/CXR  - f/u C.diff    # severe protein calorie malnutrition. unintentional weight loss over past 1 year and she's being evaluated at [facility]  # J tube leak due to animal bite (chewed by [place] pig). It was exchanged by IR on 8/28  - trial of J tube tolerance  - nutrition consult   - outpt f/u for further workup of weight loss    # Difficulty with access  - unable to place pIV even with [place]. Midline requested     # Gastroparesis  - cont erythromycin    # Depression. Stable  - cont prozac    # Asthma. No e/o exacerbation  - cont singulair    # FEN: mechanical diet as tolerated. J tube as tolerated    # DVT Prophylaxis:  Heparin sq bid    # Code Status: FULL    # Dispo: pt lives in [place] with her boyfriend's family. She moved from [place] about 3 wks ago. Her family is in [place].    spent including chart review    Addendum: notified by RN about pt having seizure. Pt had gone to the bathroom and coming back to her room when she felt that she was having a seizure. She laid down on the floor then she became stiff and started having  jerking movements with eyes rolled backward. No tongue biting/incontinence. NS was applied to both eyes within 1-2 mins, pt relaxed her body. Vitals stable. Chemstick was 99. No post ictal phase.     Thurmon Fair, MD  Hospitalist Service  [facility]    Pager # [phone]  PI # [phone]

## 2017-04-25 NOTE — Brief Op Note (Signed)
DATE:  04/25/2017    PROCEDURE:  Jejunostomy tube replacement    HISTORY:   History of gastroparesis diagnosed in [place] with gastric emptying study. In an outside hospital, supposedly J-tube was placed for the patient. However at the time of exam, only a stoma measurement device for G-tube was noted at the site of puncture. Evidence of the pexy of the bowel is noted. patient's Ginny pig has made a hole in the tube and needs to be changed.     OPERATORS:   Amie Portland, M.D., IR Attending physician     SEDATION TIME:  No sedation.  Lidocaine gel and local injection of lidocaine in addition to 2 mg of ativan were used.     FLUOROSCOPY TIME:  3.8   minutes;  3.9   mGy    CONTRAST:  5 mL Omni 300 with extension catheter and    DOCUMENTATION: Informed consent was obtained and a procedural pause was performed to confirm the identity of the patient as well as the intended procedure and operative site.    TECHNIQUE:    The patient was placed supine on the table and the site of J tube was cleaned. After local lidocaine injection and lidocaine gel utilization, the existing tube which was a stoma length measurement device and not an actual J tube, was removed over the wire. A new 71FJ tube was cut (25 cm) and advanced into the jejunum. The location was confirmed.     FINDINGS:   Successful replacement of the previously placed tube with a new 71F x 25 cm J tube.     EBL:  Less than 5 mL    COMPLICATIONS: none    IMPRESSION:   A new 12 F x 25 cm (cut to fit) J tube replaced the stoma measurement device that was placed at the jejunostomy tube.     PLAN AND FOLLOW-UP:   Routine exchange in 6 months.

## 2017-04-25 NOTE — Nurse Focus (Signed)
Notified team that pt has no PIV access and multiple RN's attempted to gain PIV access and unsuccessful.  Action RN attempted with [place] and unsuccessful.  Per MD ok for no IV access.

## 2017-04-25 NOTE — MD Query (Addendum)
MEDICAL RECORD INPATIENT CODING UNIT  CLINICAL DOCUMENTATION IMPROVEMENT REQUEST    Patient Name:   Chloe Barron 11Yd  Acct:     MRN: [id number]  Admit Date: 04/23/2017   Physician: Dr. Selena Batten,         Additional documentation/clarification is needed to ensure that the severity of illness, risk of mortality, and DRG are accurate for your patient's encounter.  The following clinical information needs clarification.  Please see my question(s) below.  To respond, addend and sign this query.  Your addendum and signature will be filed as a permanent note in the patient's legal medical record.      Per the ICD 10 coding guidelines, this definition is as follows:   Underweight - BMI 19 or less;  This patient's height and weight is documented as: WT- 39.10 Kg, HT- 1.52 M  The BMI is documented as: 16.8 Kg/ M2   Would you please indicate below whether there is a corresponding diagnosis for these physical findings and BMI that should be included, such as:   1. Underweight - BMI 19 or less;  2. Other explanation or diagnosis supported by the clinical findings (please provide)   3. Unable to determine a diagnosis (no explanation for clinical findings)     Answer :1      Thank you,    [facility]  Clinical Documentation Specialist  Health Information Management (HIM) Division  Value Department  [phone]

## 2017-04-25 NOTE — Nurse Assessment (Signed)
ASSESSMENT NOTE    Note Started: 04/25/2017, 19:15     Initial assessment completed and recorded in EMR.  Report received from day shift nurse and orders reviewed. Plan of Care reviewed and updated, discussed with patient.  Wayne Both, RN

## 2017-04-25 NOTE — Plan of Care (Addendum)
Problem: Patient Care Overview (Adult)  Goal: Plan of Care Review  Outcome: Ongoing (interventions implemented as appropriate)    Goal: Individualization and Mutuality  Outcome: Ongoing (interventions implemented as appropriate)    Goal: Discharge Needs Assessment  Outcome: Ongoing (interventions implemented as appropriate)  Goal Outcome Evaluation Note    Chloe Barron 11Yd is a 59yr female admitted 04/23/2017    OUTCOME SUMMARY AND PLAN MOVING FORWARD:   Pt ALOx4.  Pt stand by assist.  PT had an episode of a witnessed seizure lasting approximately .  Md made aware and was at bedside assessing pt.  Pt stabilized.  Pt Had J-tube replaced in IR this am.  J-tube flushing sluggishy with medication administration. Attempted to flush J-tube many times and no patency.  MD and IR made aware.  IR MD up to bedside assessing J-tube.  Per IR MD will try to re-assess and "unclog" J-tube tmrw in IR.  Hold tube feeds for now.  Pt able to take meds ok PO.  Pt provided stool sample for C-diff dx, but stool sample is more formed and soft, per lab stool sample needs to be loose.  Stool sample for c-diff needs to be collected. Will continue to monitor.    Problem: Sleep Pattern Disturbance (Adult)  Goal: Adequate Sleep/Rest  Patient will demonstrate the desired outcomes by discharge/transition of care.   Outcome: Ongoing (interventions implemented as appropriate)      Problem: Pressure Ulcer Risk (Braden Scale) (Adult,Obstetrics,Pediatric)  Goal: Identify Related Risk Factors and Signs and Symptoms  Related risk factors and signs and symptoms are identified upon initiation of Human Response Clinical Practice Guideline (CPG)   Outcome: Ongoing (interventions implemented as appropriate)    Goal: Skin Integrity  Patient will demonstrate the desired outcomes by discharge/transition of care.   Outcome: Ongoing (interventions implemented as appropriate)      Problem: Pain, Acute (Adult)  Goal: Acceptable Pain Control/Comfort Level  Patient  will demonstrate the desired outcomes by discharge/transition of care.   Outcome: Ongoing (interventions implemented as appropriate)      Problem: Pressure Ulcer Risk (Braden Scale) (Adult,Obstetrics,Pediatric)  Goal: Skin Integrity  Patient will demonstrate the desired outcomes by discharge/transition of care.   Outcome: Ongoing (interventions implemented as appropriate)      Problem: Seizure Disorder/Epilepsy (Adult)  Goal: Signs and Symptoms of Listed Potential Problems Will be Absent or Manageable (Seizure Disorder/Epilepsy)  Signs and symptoms of listed potential problems will be absent or manageable by discharge/transition of care (reference Seizure Disorder/Epilepsy (Adult) CPG).  Outcome: Ongoing (interventions implemented as appropriate)  Pt had an episode of a witnessed seizure activity last approximately for 8 minutes.  Seizure safety precautions maintained.

## 2017-04-26 DIAGNOSIS — F329 Major depressive disorder, single episode, unspecified: Secondary | ICD-10-CM

## 2017-04-26 DIAGNOSIS — I498 Other specified cardiac arrhythmias: Secondary | ICD-10-CM

## 2017-04-26 DIAGNOSIS — E43 Unspecified severe protein-calorie malnutrition: Secondary | ICD-10-CM

## 2017-04-26 DIAGNOSIS — J45909 Unspecified asthma, uncomplicated: Secondary | ICD-10-CM

## 2017-04-26 DIAGNOSIS — R197 Diarrhea, unspecified: Secondary | ICD-10-CM

## 2017-04-26 LAB — CBC WITH DIFFERENTIAL
BASOPHILS %: 0.9 %
Basophils Abs: 0.1 10*3/uL (ref 0.0–0.5)
EOSINOPHILS %: 9.2 %
EOSINOPHILS ABS: 0.5 10*3/uL — AB (ref 0.1–0.3)
HEMATOCRIT: 31.3 % — AB (ref 36.0–46.0)
HEMOGLOBIN: 10.8 g/dL — AB (ref 12.0–16.0)
LYMPHOCYTES ABS: 1.8 10*3/uL (ref 1.2–5.2)
LYMPHS %: 32.1 %
MCH: 31.7 pg (ref 27.0–33.0)
MCHC: 34.5 % (ref 32.0–36.0)
MCV: 92.1 UM3 (ref 80.0–100.0)
MONOCYTES %: 9.2 %
MONOCYTES ABS: 0.5 10*3/uL (ref 0.3–0.5)
MPV: 7 UM3 (ref 6.8–10.0)
NEUTROPHIL ABS: 2.7 10*3/uL (ref 1.8–8.0)
PLATELET COUNT: 286 10*3/uL (ref 130–400)
PLATELET ESTIMATE, SMEAR: ADEQUATE
POLYS (SEGS)%: 48.6 %
RDW: 14.4 % (ref 0.0–14.7)
RED CELL COUNT: 3.39 10*6/uL — AB (ref 4.10–5.10)
WHITE BLOOD CELL COUNT: 5.6 10*3/uL (ref 4.5–13.0)

## 2017-04-26 LAB — RENAL FUNCTION PANEL
Albumin: 2.9 g/dL — ABNORMAL LOW (ref 3.4–4.8)
CALCIUM: 9.1 mg/dL (ref 8.6–10.5)
CARBON DIOXIDE TOTAL: 26 mmol/L (ref 24–32)
CREATININE BLOOD: 0.56 mg/dL (ref 0.44–1.27)
Chloride: 101 mmol/L (ref 95–110)
GLUCOSE: 77 mg/dL (ref 70–99)
PHOSPHORUS (PO4): 3.7 mg/dL (ref 2.4–5.0)
POTASSIUM: 3.8 mmol/L (ref 3.3–5.0)
SODIUM: 136 mmol/L (ref 135–145)
UREA NITROGEN, BLOOD (BUN): 3 mg/dL — AB (ref 8–22)

## 2017-04-26 LAB — THYROXINE, FREE (FREE T4): THYROXINE, FREE (FREE T4): 0.68 ng/dL (ref 0.56–1.64)

## 2017-04-26 LAB — HIV AG/AB COMBO SCREEN: HIV AG/AB COMBO SCREEN: NONREACTIVE

## 2017-04-26 LAB — TSH WITH FREE T4 REFLEX: THYROID STIMULATING HORMONE: 5.32 u[IU]/mL — AB (ref 0.35–3.30)

## 2017-04-26 LAB — KEPPRA (LEVETIRACETAM): KEPPRA (LEVETIRACETAM): 10.6 ug/mL (ref 6.0–46.0)

## 2017-04-26 LAB — MAGNESIUM (MG): MAGNESIUM (MG): 1.8 mg/dL (ref 1.5–2.6)

## 2017-04-26 MED ORDER — ACETAMINOPHEN 325 MG TABLET
650.0000 mg | ORAL_TABLET | ORAL | Status: DC | PRN
Start: 2017-04-26 — End: 2017-05-03
  Administered 2017-04-26 – 2017-04-28 (×3): 650 mg via ORAL
  Filled 2017-04-26 (×3): qty 2

## 2017-04-26 MED ORDER — NACL 0.9% IV BOLUS - DURATION REQ
500.0000 mL | Freq: Once | INTRAVENOUS | Status: AC
Start: 2017-04-26 — End: 2017-04-26
  Administered 2017-04-26: 500 mL via INTRAVENOUS

## 2017-04-26 MED ORDER — DIPHENHYDRAMINE 50 MG/ML INJECTION SOLUTION
25.0000 mg | Freq: Four times a day (QID) | INTRAMUSCULAR | Status: DC | PRN
Start: 2017-04-26 — End: 2017-05-03
  Administered 2017-04-27 – 2017-05-03 (×16): 25 mg via INTRAVENOUS
  Filled 2017-04-26 (×16): qty 1

## 2017-04-26 MED ORDER — HYDROCODONE 7.5 MG-ACETAMINOPHEN 325 MG/15 ML ORAL SOLUTION
10.0000 mL | Freq: Once | ORAL | Status: AC
Start: 2017-04-26 — End: 2017-04-26
  Administered 2017-04-26: 10 mL via ORAL
  Filled 2017-04-26: qty 15

## 2017-04-26 MED ORDER — D5 / 0.45% NACL IV INFUSION
INTRAVENOUS | Status: DC
Start: 2017-04-26 — End: 2017-05-03
  Administered 2017-04-26 – 2017-05-03 (×15): via INTRAVENOUS

## 2017-04-26 NOTE — Nurse Focus (Signed)
Per P.T. Dybdahl, while working with patient and asked to stand, she suddenly passed out again. Seen patient unresponsive in bed, vs taken, MD notified, shortly after patient woke up and started talking. Bed alarm on, placed on telemetry. Denies any pain at this time.   pls see PT notes for detailed incident. Will continue to monitor. Elvis CoilJanet Elyse Prevo, RN

## 2017-04-26 NOTE — MD Query (Addendum)
MEDICAL RECORD INPATIENT CODING UNIT  CLINICAL DOCUMENTATION IMPROVEMENT REQUEST    Patient Name:   Chloe Barron  Acct: [id number]   MRN: [id number]  Admit Date: 04/21/2017   Physician: Dr. Jake ChurchKwan Freyja Govea Disch Date: 04/22/2017       Additional documentation/clarification is needed to ensure that the severity of illness, risk of mortality, and DRG are accurate for your patient's encounter.    The following clinical information needs clarification.  Please see my question(s) below.  To respond, addend and sign this query.  Your addendum and signature will be filed as a permanent note in the patient's legal medical record.      The medical record reflects the following clinical findings, treatment, and risk factors.    Clinical Findings: PMH: Protein calorie malnutrition.     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     Treatment: J tube placed at previous admission for gastroparesis. Food and medication via the J tube.      QUESTION: Based on the information described above, is the protein calorie malnutrition:       1. Severe    2. Moderate    3. Mild    4. Resolved    5. Other explanation or diagnosis supported by the clinical findings (please provide)    6. Unable to determine a diagnosis (no explanation for clinical findings)    Please provide your answer below by clicking the addend button    ANSWER:    Severe      Thank you,    Ellery PlunkKathleen Bell  Coding Quality Analyst  Health Information Management (HIM) Division  Value Department  [phone]

## 2017-04-26 NOTE — Plan of Care (Signed)
Problem: Patient Care Overview (Adult)  Goal: Plan of Care Review  Outcome: Ongoing (interventions implemented as appropriate)  Goal Outcome Evaluation Note    Chloe Barron 11Yd is a [age]yr female admitted 04/23/2017    OUTCOME SUMMARY AND PLAN MOVING FORWARD:   TF formula and regimen clarified with patient. Order pended for MD signature. Tube feeds to resume once J tube clog is addressed by IR. Please refer to RD note filed under [facility] for additional information.      Problem: Nutrition, Enteral (Adult)  Goal: Signs and Symptoms of Listed Potential Problems Will be Absent or Manageable (Nutrition, Enteral)  Signs and symptoms of listed potential problems will be absent or manageable by discharge/transition of care (reference Nutrition, Enteral (Adult) CPG).  Outcome: Ongoing (interventions implemented as appropriate)

## 2017-04-26 NOTE — Allied Health Progress (Signed)
ADULT INITIAL NUTRITION ASSESSMENT    Admission Date: 04/23/2017   Date of Service: 04/26/2017, 09:32     Reason For Assessment: Consult, Enteral/Parenteral Nutrition    Nutrition Assessment     Admission Summary:   21yrold female with history of diamond-blackfan anemia s/p BMT, chronic GVHD, seizure disorder, psychiatric disorder, gastroparesis and [facility] syndrome [facility] (04/12/17). Admitted for leaky J-tube s/p hamster bite to proximal portion of the J-tube. S/p J tube replacement with IR (7/28), but appears clogged today    Food & Nutrition Related History:   Patient reports she has been on tube feeds infused via pump for several years, previously via G tube, only recently had J tube placed due to ongoing nausea and vomiting. Has been on multiple formulas in the past (Nutren 1.5, Compleat (adult), Osmolite 1.0, etc), but reports she tolerates Compleat Pediatric best and wants to stay with this formula, but does not want to increase hourly volume due to abdominal discomfort. [facility], patient reports she has had a lot of diarrhea and has not been giving herself her full tube feeding, has been supplementing with baby formula (Similac), which was a self-imposed therapy and was not advised/instructed on by any medical professional. TDewaine Congeronly a few bites of food by mouth per day, for comfort.    Home Tube Feeding Regimen (most recently):  Compleat Pediatric - 3 cartons/day + 180 mL water mixed with each carton   TF+ water mixed together and infused via pump @ 55 ml/hr (24 hours/day, does not want to try cycling at this point due to ongoing intolerance issues)  Provides 750 kcals, 28 g protien and 1800 mL free water daily  *Patient admits this was a self imposed change in her TF regimen, was previously getting Compleat Pediatric mixed with Nutren 1.5, but stopped the Nutren 1.5 due to perceived intolerance.  Temporarily increased Compleat Pediatric to 5 cans/day, but did not tolerate the increased volume.    Also of note, [place] [facility] [facility] [place] Patient brought her Kangaroo pump and 2 cases of formula with her when she moved, but has not established care with a new company yet. Had Medicare in [place] is working on obtaining Medical now. Case Manager aware and assisting with transition, reports patient currently has temporary coverage for 2 months pending completion/approval of Medical.    Food Allergies: garlic    Nutrition Focused Physical Exam: completed 04/26/17  Subcutaneous Fat   Orbital Region: mild hollowing (mild-moderate loss)  Triceps Area: WDL  Midaxillary Line: ribs and iliac crest somewhat apparent (mild-moderate loss)  Muscle  (Upper Body)  Temporalis: slight depression (mild-moderate loss)  Clavicle Region (pectoralis major, deltoid, trapezius): slight protrusion of clavicle (mild-moderate loss)  Clavicle and Acromion Region (deltoid): slight protrusion of acromion process (mild-moderate loss)  Scapular Region (trapezius, latissimus dorsi): WDL  Dorsal Hand (interosseous): WDL  Muscle (Lower Body)  Patellar Region (quadriceps): WDL  Anterior Thigh Region (quadriceps): WDL  Posterior Calf Region (gastrocnemius): WDL    Summary of findings: mild-moderate fat depletion; mild-moderate muscle depletion    Additional nutrition focused physical findings:  Potential signs of inflammation: chronic disease (diamond-blackfan anemia s/p BMT, chronic GVHD, gastroparesis, SMA syndrome)  Digestive Systems:     Last BM: 8/28 (x2, moderate)   GI s/sx: abdominal discomfort   Tubes: jejunostomy tube to LUQ (replaced by  IR 8/28)  Skin: no open wounds noted    Anthropometrics:   Height: 152.4 cm (5')  Admission Weight: 39.1 kg (8/26, bed scale) no edema  Current Weight: NA  Usual Body Weight: 130 lb (59.1 kg) per  patient report, 2 years ago;   Lowest Weight: 78 lbs (35.5 kg), though no record of this in EMR  Weight History per EMR:   43.6 kg (04/12/17, [facility], bed scale)   38.2 kg (03/30/17, office visit)   37.6 kg (03/13/17, ED visitvisit)   40.1 kg (03/02/17, [place] admission)   40.4 kg (02/17/16, ED visit)  Weight Change: -23.6 kg (40%) weight loss over ~2 years per patient report; -4.5 kg (10%) weight loss over ~10 days (likely related to dehydration/diarrhea) but ~stable from 2 months ago  BMI: 16.9 kg/m^2  Ideal Body Weight: 48 kg  % Ideal Body Weight: 86%    Pertinent Labs:   Hypokalemia (3.0) on admission, corrected to [facility] today    Pertinent Medications:   Erythromycin (ERY-TAB) Delayed Release Tablet 250 mg, ORAL, TID w/ meals  FamoTIDine (PEPCID) Tablet 20 mg, ORAL, BID    IV Fluids:  Lactated Ringers, , IV, CONTINUOUS, Last Rate: 75 mL/hr at 04/26/17 0800    PRN Medications:  Ondansetron (ZOFRAN) Injection 4 mg, IV, Q8H PRN (given x1 yesterday)  Promethazine (PHENERGAN) Injection 12.5 mg, IV, Q6H PRN (given x1 yesterday and today)    Nutrition Order:    PO: Mechanical Soft (chopped), Bland  EN: Osmolite 1.0, start @ 10 ml/hr, increase by 10 ml q 8-12 hours as tolerated. Goal 55 ml/hr    Estimated Nutrition Needs:  [employer] (1088) x x 1.2-1.3 + 250 kcal/day for weight gain (using 39.1 kg) = 1550-1665 kcal/day  1.3-1.5 g protein/kg (using 39.1 kg) = 50-60 g protein/day  35-40 mL fluid/kg (using 39.1 kg) = 1370-1565 mL fluid/day    Estimated Nutrition Intake: 8/26-8/28  Average 40% of 3 small meals over past 3 days (negligible)    Nutrition Diagnosis     Moderate (non-severe) malnutrition in the context of chronic illness (more than 3 months) with mild to moderate chronic inflammation related to chronic disease (diamond-blackfan anemia s/p BMT, chronic GVHD, gastroparesis, SMA syndrome) as evidenced by mild loss of muscle and subcutaneous fat mass based upon physical exam.   - Status: New - may meet criteria  for severe, but unable to quantify energy intake over past 1 month and weight history overall stable in last 2-3 months    Altered GI function related to gastroparesis/SMA syndrome as evidenced by reliance on EN via J tube to meet majority of nutrition needs.  - Status: New    Nutrition Intervention (Recommendations)     1) Enteral nutrition   - Once J-tube has been unclogged and cleared for use  - Compleat Pediatric @ 65 mL/hr continuously   - Start @ 15 mL/hr & advance as tolerated by 15 mL/hr Q 4-6 hours until goal is met.  - TF would provide 1560 mL formula, 1560 kcal, 59 g protein and 1310 mL of water daily at goal rate.  - Water flush @ 40 mL Q 4 hours for hydration (additional 240 mL free water/day)    *RD will monitor closely for tolerance, may need formula adjustment to peptide based formula if patient does not tolerate this formula/rate      2) Discharge and transfer to other providers  - Case Manager working on connecting patient with local tube feeding company  for continued provision of tube feeding supplies on discharge    Nutrition Monitoring & Evaluation (Goals)     Total energy intake: 1400+ kcal/day  Total protein intake: 45+ g of protein/day  Weight: 0.5-1 lb weight gain weekly toward IBW  Digestive system: soft/formed BM every 1-2 days        Report Electronically Signed By: Carlyle Lipa, RD  Pager: [phone]    (Covering for: Benancio Deeds, [place]   Pager [phone])

## 2017-04-26 NOTE — Plan of Care (Signed)
Problem: Patient Care Overview (Adult)  Goal: Plan of Care Review  Outcome: Ongoing (interventions implemented as appropriate)  Goal Outcome Evaluation Note    Chloe Barron 11Yd is a 43yrfemale admitted 04/23/2017    OUTCOME SUMMARY AND PLAN MOVING FORWARD:   Stable. Low BP normally. Had 7.5 min seizure activity; no respiratory distress; reoriented; MD aware. Soft BM x 1. Up with assist. Plan to unclog J tube in IR. Stool to collect for Cdiff diagnostic.     04/26/17 0307   OTHER   Plan Of Care Reviewed With patient   Plan of Care Review   Progress progress toward functional goals as expected     Goal: Individualization and Mutuality  Outcome: Ongoing (interventions implemented as appropriate)    Goal: Discharge Needs Assessment  Outcome: Ongoing (interventions implemented as appropriate)      Problem: Sleep Pattern Disturbance (Adult)  Goal: Adequate Sleep/Rest  Patient will demonstrate the desired outcomes by discharge/transition of care.   Outcome: Ongoing (interventions implemented as appropriate)   04/26/17 0307   Sleep Pattern Disturbance (Adult)   Adequate Sleep/Rest making progress toward outcome       Problem: Pressure Ulcer Risk (Braden Scale) (Adult,Obstetrics,Pediatric)  Goal: Identify Related Risk Factors and Signs and Symptoms  Related risk factors and signs and symptoms are identified upon initiation of Human Response Clinical Practice Guideline (CPG)   Outcome: Outcome(s) achieved Date Met: 04/26/17   04/26/17 0307   Pressure Ulcer Risk (Braden Scale)   Related Risk Factors (Pressure Ulcer Risk (Braden Scale)) mechanical forces;nutritional deficiencies     Goal: Skin Integrity  Patient will demonstrate the desired outcomes by discharge/transition of care.   Outcome: Ongoing (interventions implemented as appropriate)   04/26/17 0307   Pressure Ulcer Risk (Braden Scale) (Adult,Obstetrics,Pediatric)   Skin Integrity making progress toward outcome       Problem: Pain, Acute (Adult)  Goal: Acceptable Pain  Control/Comfort Level  Patient will demonstrate the desired outcomes by discharge/transition of care.   Outcome: Ongoing (interventions implemented as appropriate)   04/26/17 0307   Pain, Acute (Adult)   Acceptable Pain Control/Comfort Level making progress toward outcome       Problem: Pressure Ulcer Risk (Braden Scale) (Adult,Obstetrics,Pediatric)  Goal: Skin Integrity  Patient will demonstrate the desired outcomes by discharge/transition of care.   Outcome: Ongoing (interventions implemented as appropriate)   04/26/17 0307   Pressure Ulcer Risk (Braden Scale) (Adult,Obstetrics,Pediatric)   Skin Integrity making progress toward outcome       Problem: Seizure Disorder/Epilepsy (Adult)  Goal: Signs and Symptoms of Listed Potential Problems Will be Absent or Manageable (Seizure Disorder/Epilepsy)  Signs and symptoms of listed potential problems will be absent or manageable by discharge/transition of care (reference Seizure Disorder/Epilepsy (Adult) CPG).   Outcome: Outcome(s) achieved Date Met: 04/26/17   04/26/17 0307   Seizure Disorder/Epilepsy   Problems Assessed (Seizure Disorder/Epilepsy) preictal, ictal, postictal events;situational response   Problems Present (Seizure Disorder/Epilepsy) preictal, ictal, postictal events;situational response

## 2017-04-26 NOTE — Plan of Care (Signed)
Problem: Patient Care Overview (Adult)  Goal: Plan of Care Review  Outcome: Ongoing (interventions implemented as appropriate)    Goal: Individualization and Mutuality  Outcome: Ongoing (interventions implemented as appropriate)    Goal: Discharge Needs Assessment  Outcome: Ongoing (interventions implemented as appropriate)      Problem: Sleep Pattern Disturbance (Adult)  Goal: Adequate Sleep/Rest  Patient will demonstrate the desired outcomes by discharge/transition of care.   Outcome: Ongoing (interventions implemented as appropriate)      Problem: Pressure Ulcer Risk (Braden Scale) (Adult,Obstetrics,Pediatric)  Goal: Skin Integrity  Patient will demonstrate the desired outcomes by discharge/transition of care.   Outcome: Ongoing (interventions implemented as appropriate)      Problem: Pain, Acute (Adult)  Goal: Acceptable Pain Control/Comfort Level  Patient will demonstrate the desired outcomes by discharge/transition of care.   Outcome: Ongoing (interventions implemented as appropriate)      Problem: Pressure Ulcer Risk (Braden Scale) (Adult,Obstetrics,Pediatric)  Goal: Skin Integrity  Patient will demonstrate the desired outcomes by discharge/transition of care.   Outcome: Ongoing (interventions implemented as appropriate)      Problem: Nutrition, Enteral (Adult)  Goal: Signs and Symptoms of Listed Potential Problems Will be Absent or Manageable (Nutrition, Enteral)  Signs and symptoms of listed potential problems will be absent or manageable by discharge/transition of care (reference Nutrition, Enteral (Adult) CPG).   Outcome: Ongoing (interventions implemented as appropriate)

## 2017-04-26 NOTE — Clinical Case Management (Deleted)
Clinical Case Management Assessments    Name: Chloe Barron  MRN: [id number]   Date of Birth: 03/28/1996 (43yr) Gender: female    Note Date: 04/26/2017 Note Time: 08:28       INITIAL ASSESSMENT NOTE    Patient able to participate in plan?: Yes    Primary support person: Noah Charon ([phone])         Permanent Address: [address]  [place] [address]  Discharge Address: same as above    Living arrangements: Spouse/Significant other  Patient came from a care facility?: No  Type of Residence: private residence  Pre-Hospital Services:   DME in place:       Pre-Hospitalization self-care deficits: None   Pre-Hospitalization mobility deficits: Independent     Patient can follow-up with:   PCP: No Pcp Per Patient  / Phone Number: None  Preferred Pharmacy:     Funding/Billing: Payor: [facility] / Plan: [facility]    Does the patient have ongoing DC Planning needs?: Yes      Comments:   Patient is a 21yr-old  female with a history of 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 Given the patient is 12 days out from initial placement and appears chronically malnourished, recommend against exchanging at the current time. Plan to engage IR for either possible patch vs adapter placement since there appears to be ~20 cm of usable tubing between the bite site and the abdominal wall fastener.  Chart reviewed.    Date/Time: 04/26/2017 08:28  Electronically signed by:  Carmelia Bake. Clayton Lefort, RN, BSN  Department of Clinical Case Management  [facility]  Pager: [phone]

## 2017-04-26 NOTE — Nurse Assessment (Signed)
ASSESSMENT NOTE    Note Started: 04/26/2017, 09:24     Initial assessment completed and recorded in EMR.  Report received from night shift nurse and orders reviewed. Plan of Care reviewed and appropriate, discussed with patient.  Elvis CoilJanet Merica Prell, RN RN

## 2017-04-26 NOTE — Progress Notes (Signed)
[facility] HOSPITALIST - INTERNAL MEDICINE DAILY PROGRESS NOTE    Patient: Chloe Barron 11Yd 79yr female Date of Admission: 04/23/2017   MRN: [id number] Length of Stay: . LOS: 3 days    Room: 12759/127591 Date of Service: 04/26/2017     24 EVENTS / SUBJECTIVE:    J tube clogged overnight, plan to attempt to declog today  Unwitnessed fall, patient states that she felt lightheaded before hand, she relates her prior history of POTS.  Some mild head trauma, otherwise no acute complaints.       ROS: as above    OBJECTIVE:    Scheduled Medications:    Current Facility-Administered Medications:  Enoxaparin (LOVENOX) Injection 30 mg SUBCUTANEOUS Q24H   Erythromycin (ERY-TAB) Delayed Release Tablet 250 mg ORAL TID w/ meals   FamoTIDine (PEPCID) Tablet 20 mg ORAL BID   Fluoxetine (PROZAC) 20 mg/5 mL Solution 60 mg ORAL QAM   Iohexol (OMNIPAQUE) 300 mg/mL Injection for Oral/Enteral Tube/Rectal Use 5 mL Oral/Enteral Tube/Rectal IMAGING X 1   LevETIRAcetam (KEPPRA) 100 mg/mL Solution 750 mg ORAL BID   Montelukast (SINGULAIR) Tablet 10 mg ORAL Daily Bedtime   Pregabalin (LYRICA) Capsule 75 mg ORAL TID   Valproate Sodium (DEPAKENE) 250 mg/5 mL Solution 250 mg ORAL BID     Continuous Medications:    Lactated Ringers  Last Rate: 75 mL/hr at 04/26/17 0800     PRN Medications:  DiphenhydrAMINE (BENADRYL) Injection 25 mg, IV, Q6H PRN  Hydrocodone 7.5 mg-Acetaminophen 325 mg/15 mL (HYCET) Solution 10 mL, ORAL, Q4H PRN  Lidocaine (L-M-X4) 4 % Cream, TOPICAL-site required, PRN  Lidocaine (XYLOCAINE) 10 mg/mL (1%) Injection 0.1-2 mL, INTRADERMAL, PRN  Lidocaine (XYLOCAINE) 10 mg/mL (1%) Injection 0.5-20 mL, INFILTRATION, PRN  Lidocaine cream REMOVAL, TOPICAL-site required, PRN  Ondansetron (ZOFRAN) Injection 4 mg, IV, Q8H PRN  Promethazine (PHENERGAN) Injection 12.5 mg, IV, Q6H PRN        Intake and Output  Last Two Completed Shifts  In: 1808.3 [Oral:902; Crystalloid:906.3]  Out: 1700 [Urine:1700]    Current Shift  In: 300 [Oral:300]  Out: 600  [Urine:600]    PHYSICAL EXAM  Gral: cachetic young female with sunken eyes  HEENT: PERRL, EOMI, op clear  Neck: supple  Chest: CTAB, nl resp effort  CV: RRR, no m/r/g  Abd: soft, J tube site c/d/i, no sig tenderness to palpation, +BS  Ext: no edema  Neuro: AAOX3, non focal    CURRENT VITALS      MIN - MAX  BP: (!) 84/49      BP: (81-108)/(45-59)   Pulse: 68      Pulse Min: 63 Max: 85  Resp: 18      Resp Min: 16 Max: 24  SpO2: 99 %      SpO2 Min: 97 % Max: 100 %  Temp: 36.7 C (98 F)      Temp Min: 36 C (96.8 F) Max: 36.7 C (98 F)  Weight: 39.1 kg (86 lb 3.2 oz)  SpO2: 99 %  Flow (L/min): 15  Pulse: 68    Lines & Drains: none    LABS:    CBC  Recent labs for the past 72 hours     04/26/17 0643 04/23/17 2217 04/23/17 1722    WHITE BLOOD CELL COUNT 5.6 13.0 16.4*    HEMOGLOBIN 10.8* 10.2* 11.2*    HEMATOCRIT 31.3* 29.7* 32.7*    MCV 92.1 92.0 91.5    PLATELET COUNT 286 229 260      CHEMISTRY  Recent labs for the past 72 hours     04/26/17 0643 04/23/17 2217 04/23/17 1722    SODIUM 136 140 136    POTASSIUM 3.8 3.2* 3.0*    CHLORIDE 101 111* 105    CARBON DIOXIDE TOTAL 26 25 25     CREATININE BLOOD 0.56 0.66 0.68    UREA NITROGEN, BLOOD (BUN) 3* <1* 1*    GLUCOSE 77 83 82    CALCIUM 9.1 8.2* 8.3*    PHOSPHORUS (PO4) 3.7 4.3 --    MAGNESIUM (MG) 1.8 1.6 --          No results found for this basename: INR:* in the last 72 hours     MICROBIOLOGY:  UA neg    IMAGING  CXR -   1. No acute cardiopulmonary disease.    NCHCT  No acute intracranial hemorrhage or mass effect.    J tube replacement 8/28  A new 12 F x 25 cm (cut to fit) J tube replaced the stoma measurement device that was placed at the jejunostomy tube.     OSH EEG (04/17/17)  IMPRESSION:This is an abnormal EEG due to the presence of   excessive fast activity as well as some underlying slowing.     ASSESSMENT AND PLAN     21yo female with MMP including hx of recurrent admissions with pseudoseizure, diamond-blackckfan anemia s/p BMT, chronic GVHD,  SMA syndrome, gastroparesis, severe malnutrition s/p J tube placement 8/15, PTSD presenting with somnolence after pt was given high doses of benzo due to concern for possible seizure. Pt was initially admitted to MICU for observation and transferred to the floor on 8/28. Pt also with leaking J tube due to animal bite.     # Somnolence due to benzos, resolved  Pt had 24mg  of versed by EMS and 2mg  of Ativan in ED.  Somnolence has since resolved, per Neurology recommending against further benzodiazepines for seizure like activity as patient having pseudoseizures.      # Pseudoseizure. Pt with multiple hospitalizations and prior neuro eval with EEG. NCHCT neg  - pt with neg EEG in the past but pt is still on keppra and valproic acid  - neuro recommends against treating the pseudoseizure. Trial of NS eye drops if having convulsions.     #Syncope  #POTS  Patient with a history of POTS, she states that she is on salt tabs for the treatment of this, unable to take currently due to J tube malfunction.  Fall this AM with prodrome, likely related to POTS.  Given patient age do not suspect cardiac cause, prior EKG normal, no signs on exam concerning for valvular disease.  BP low after fall, will give 500 cc bolus.  Will also provide compression stockings for mechanical support.  Neuro exam non focal, will not repeat CT head at this time, will start Neuro checks for today.   -NS 500 cc x1  -Compression stockings ordered through orthotics  -Neuro checks q2 hours today, if acute changes then can consider CT head  -PT eval    # Leukocytosis, resolved  # Diarrhea. abd exam is benign  WBC resolved and is WNL without any intervention. Afebrile. Neg UA/CXR  - f/u C.diff    # severe protein calorie malnutrition. unintentional weight loss over past 1 year and she's being evaluated at [facility]  # J tube leak due to animal bite (chewed by [place] pig). It was exchanged by IR on 8/28 although now clogged, IR aware will evaluate.   -  trial  of J tube tolerance  - nutrition consult   - outpt f/u for further workup of weight loss    # Difficulty with access  - unable to place pIV even with US. Midline requested     # Gastroparesis  - cont erythromycin    # Depression. Stable  - cont prozac    # Asthma. No e/o exacerbation  - cont singulair    # FEN: mechanical diet as tolerated. J tube as tolerated    # DVT Prophylaxis:  Heparin sq bid    # Code Status: FULL    # Dispo: [place] lives in [place] with her boyfriend's family. She moved from [place] about 3 wks ago. Her family [place] in [place].    Cyril Loosenianyi Codee Bloodworth, MD Hospitalist  Pager: [phone]  PI: [phone]

## 2017-04-26 NOTE — Allied Health Progress (Signed)
Orthotics/Prosthetics  Progress Note    Progress Note: [name] 11Yd was seen 04/26/2017 for a 20-53mmHg BK compression hose.  Patient has an active Doctors referral to be evaluated and treated for this device.  It has been determined by the referring physician that this device is medically necessary, can functionally benefit the patient, and is required stabilization for medical reasons.          Subjective:     Chloe Barron 9Yd is a 21yr old female. Her height is 5.0, and weight is 86 lbs.    Pain Status (out of 10): None  * If pain is high, what was done: N/A    Location of pain: N/A    Patient's Complaints: none    History of the patient/ onset: none    Additional subjective information: none         Objective:    Patient response to visit: Receptive    Observations: none      Measurements Taken:    Ankle Calf Thigh    Left 9.5" 10" "    Right 9.5" 10" "             Assessment:    Dx:     Patient Active Problem List   Diagnosis    Somnolence       Occurrence: Acute    Type of injury/ surgery: orthostatic    Precautions: per MD    Additional Assessment information: none           Plan:    Service Provided: Fit patient with a pair of BK compression stockings    Wear schedule: day time    Additional plans: none    Device Delivered: Yes, size 1 Juzo 20-37mmHg BK compression hose    Base Codes: A6530: Gradient compression stocking, below knee, 18-30 mmhg, each (bilateral)      Quantity: 2 (1 box is quantity of 2)    Education:patient was educated and demonstrated the ability to don and doff the device no (independently/ with assistance/ other) appropriately by: making sure the device is in the proper orientation before donning, and by checking for wrinkles.  Patient was instructed to return for a follow up urgently in cases involving skin redness persisting beyond 15 minutes post doffing or onset of new symptoms.  Other education provided: none    Handout: yes    Verbal Instruction: patient was given verbal  instructions on: proper cleaning of the device by hand washing air drying, proper donning and doffing, the functionality of the device and its' intended use to improve circulation, the warranty of the device as 90 days, and where to follow up if any issues occur while using the device, Other instructions provided: none    [facility] Lab Tech IV  PI # [id number] [id number]  Vocera [phone]  PM&R Dept.

## 2017-04-26 NOTE — Nurse Focus (Signed)
Found patient on the floor, blood noted to the floor and her head but upon visual inspection no active bleeding noted. Patient AAO x3, claimed that when she was about to go to the bathroom she felt lightheaded and passed out. Patient assisted back to the bed with lift team, MD came to see patient. Neuro checks q2h x3, instructed to call the nurse for any assistance needed. Call light within reach. Will monitor. Elvis CoilJanet Alasdair Kleve, RN
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
## 2017-04-26 NOTE — Nurse Focus (Signed)
MD Cai PI# [id number] notified and aware pt's BP 88/60, dizzy.  Although not new for pt, recommended increase in IVF.  Also, rec tylenol for abd pain.  Will continue to monitor.

## 2017-04-26 NOTE — Clinical Case Management (Signed)
Clinical Case Management Assessments    Name: Chloe Barron  MRN: [id number]   Date of Birth: 05-28-1996 (39yr Gender: female    Note Date: 04/26/2017 Note Time: 10:53       INITIAL ASSESSMENT NOTE    Patient able to participate in plan?: Yes    Primary support person: ZEnid [phone]         Permanent Address: [address] [id number] Discharge Address: same as above     Living arrangements: Spouse/Significant other  Patient came from a care facility?: No  Type of Residence: private residence  Pre-Hospital Services: Tube feeding (comment)  DME in place: Manual wheelchair, Front wheel walker       Pre-Hospitalization self-care deficits: Bathing, Toileting   Pre-Hospitalization mobility deficits: Partial/moderate assistance required     Patient can follow-up with:   PCP: No Pcp Per Patient  / Phone Number: None  [facility]   Funding/Billing: Payor: [facility] / Plan: [facility]      Does the patient have ongoing DC Planning needs?: Yes      Comments:   This is a 89yrld right-handed female with a 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  CM met with patient at the bedside to discuss discharge plans and goals.  Above information confirmed.  Per patient, she currently lives in a single storey [email or web address]. Patient is still unclear if she is staying here in [place] for good.  She is aware that her current Medicaid does not cover her medical needs here in [place].  Financial counselor has seen her yesterday and assisted her complete the Medi-cal application.  CM reminded patient that per HPE, she is only covered for 60 days and before the expiration, she needs to call Medi-cal to verify approval so as to avoid cancellation of coverage.     As a baseline, patient needs assistance with mobility due to her hx of severe osteoporosis.  She uses a FWW and w/c to navigate around but [place] to [place] the name of her DME provider.  Her feeding supplies has been provided by [employer] she claimed she has a month or two worth of supplies from her previous hospitalization.  Cm to continue to follow and assist patient as other discharge needs arises.    Date/Time: 04/26/2017 10:53  Electronically signed by:  AlbDonia PoundsanDione BoozeN, BSN  Department of Clinical Case Management  [facility] Hospitalager: [phone]

## 2017-04-27 ENCOUNTER — Inpatient Hospital Stay: Payer: MEDICAID

## 2017-04-27 DIAGNOSIS — R55 Syncope and collapse: Secondary | ICD-10-CM

## 2017-04-27 LAB — CBC WITH DIFFERENTIAL
BASOPHILS % AUTO: 0.9 %
BASOPHILS ABS AUTO: 0.1 10*3/uL (ref 0.0–0.2)
EOSINOPHIL % AUTO: 14.7 %
EOSINOPHIL ABS AUTO: 0.9 10*3/uL — AB (ref 0.0–0.5)
HEMATOCRIT: 32.4 % — AB (ref 36.0–46.0)
Hemoglobin: 11.1 g/dL — ABNORMAL LOW (ref 12.0–16.0)
LYMPHOCYTE ABS AUTO: 2.1 10*3/uL (ref 1.2–5.2)
LYMPHOCYTES % AUTO: 34.8 %
MCH: 31.6 pg (ref 27.0–33.0)
MCHC: 34.1 % (ref 32.0–36.0)
MCV: 92.5 UM3 (ref 80.0–100.0)
MONOCYTES % AUTO: 7.6 %
MONOCYTES ABS AUTO: 0.4 10*3/uL (ref 0.1–0.8)
MPV: 6.9 UM3 (ref 6.8–10.0)
NEUTROPHIL ABS AUTO: 2.5 10*3/uL (ref 1.8–8.0)
NEUTROPHILS % AUTO: 42 %
PLATELET COUNT: 271 10*3/uL (ref 130–400)
RDW: 14.2 % (ref 0.0–14.7)
RED CELL COUNT: 3.51 10*6/uL — AB (ref 4.10–5.10)
WHITE BLOOD CELL COUNT: 5.9 10*3/uL (ref 4.5–13.0)

## 2017-04-27 LAB — ECHOCARDIOGRAM COMPLETE
IVSD 2D: 0.66 cm (ref 0.6–0.9)
LEFT INTERNAL DIMENSION IN SYSTOLE: 3.03 cm
LEFT VENTRICULAR INTERNAL DIMENSION IN DIASTOLE: 4.24 cm (ref 3.8–5.2)
MV E-WAVE VEL/E'TISSUE VEL LAT: 7.1 E/A
MV VALVE AREA P 1/2 METHOD: 4.49 cm2
POSTERIOR WALL: 0.8 cm (ref 0.6–0.9)
RV D(2D): 2.33 cm (ref 1.9–?)
TAPSE: 1.8 cm

## 2017-04-27 LAB — RENAL FUNCTION PANEL
ALBUMIN: 2.8 g/dL — AB (ref 3.4–4.8)
CALCIUM: 8.5 mg/dL — AB (ref 8.6–10.5)
CARBON DIOXIDE TOTAL: 30 mmol/L (ref 24–32)
CHLORIDE: 103 mmol/L (ref 95–110)
Creatinine Serum: 0.54 mg/dL (ref 0.44–1.27)
GLUCOSE: 99 mg/dL (ref 70–99)
PHOSPHORUS (PO4): 4.1 mg/dL (ref 2.4–5.0)
POTASSIUM: 3.9 mmol/L (ref 3.3–5.0)
SODIUM: 139 mmol/L (ref 135–145)
UREA NITROGEN, BLOOD (BUN): 5 mg/dL — AB (ref 8–22)

## 2017-04-27 LAB — MAGNESIUM (MG): MAGNESIUM (MG): 1.8 mg/dL (ref 1.5–2.6)

## 2017-04-27 MED ORDER — CARBOXYMETHYLCELLULOSE SODIUM 1 % EYE GEL IN A DROPPERETTE
1.0000 [drp] | OPHTHALMIC | Status: DC | PRN
Start: 2017-04-27 — End: 2017-05-03
  Filled 2017-04-27: qty 1

## 2017-04-27 MED ORDER — NACL 0.9% IV BOLUS - DURATION REQ
500.0000 mL | Freq: Once | INTRAVENOUS | Status: AC
Start: 2017-04-27 — End: 2017-04-27
  Administered 2017-04-27: 500 mL via INTRAVENOUS

## 2017-04-27 MED ORDER — POLYVINYL ALCOHOL 1.4 % EYE DROPS
1.0000 [drp] | OPHTHALMIC | Status: DC | PRN
Start: 2017-04-27 — End: 2017-04-27

## 2017-04-27 NOTE — Progress Notes (Signed)
[facility] HOSPITALIST - INTERNAL MEDICINE DAILY PROGRESS NOTE    Patient: Chloe Barron 11Yd 46yr female Date of Admission: 04/23/2017   MRN: [id number] Length of Stay: . LOS: 4 days    Room: 12759/127591 Date of Service: 04/27/2017     24 EVENTS / SUBJECTIVE:    Witnessed syncopal event by PT, see PT note for further details.  No events of telemetry.  Patient with some pain at J tube site but otherwise does not have any acute complaints.  No symptoms of dizziness.  States that she felt some palpitations yesterday when she had her syncopal event.  Denies chest pain, shortness of breath .      ROS: as above    OBJECTIVE:    Scheduled Medications:    Current Facility-Administered Medications:  Enoxaparin (LOVENOX) Injection 30 mg SUBCUTANEOUS Q24H   Erythromycin (ERY-TAB) Delayed Release Tablet 250 mg ORAL TID w/ meals   FamoTIDine (PEPCID) Tablet 20 mg ORAL BID   Fluoxetine (PROZAC) 20 mg/5 mL Solution 60 mg ORAL QAM   Iohexol (OMNIPAQUE) 300 mg/mL Injection for Oral/Enteral Tube/Rectal Use 5 mL Oral/Enteral Tube/Rectal IMAGING X 1   LevETIRAcetam (KEPPRA) 100 mg/mL Solution 750 mg ORAL BID   Montelukast (SINGULAIR) Tablet 10 mg ORAL Daily Bedtime   Pregabalin (LYRICA) Capsule 75 mg ORAL TID   Valproate Sodium (DEPAKENE) 250 mg/5 mL Solution 250 mg ORAL BID     Continuous Medications:    D5 / 0.45% NaCl  Last Rate: 100 mL/hr at 04/27/17 0700     PRN Medications:  Acetaminophen (TYLENOL) Tablet 650 mg, ORAL, Q4H PRN  DiphenhydrAMINE (BENADRYL) Injection 25 mg, IV, Q6H PRN  Hydrocodone 7.5 mg-Acetaminophen 325 mg/15 mL (HYCET) Solution 10 mL, ORAL, Q4H PRN  Lidocaine (L-M-X4) 4 % Cream, TOPICAL-site required, PRN  Lidocaine (XYLOCAINE) 10 mg/mL (1%) Injection 0.1-2 mL, INTRADERMAL, PRN  Lidocaine (XYLOCAINE) 10 mg/mL (1%) Injection 0.5-20 mL, INFILTRATION, PRN  Lidocaine cream REMOVAL, TOPICAL-site required, PRN  Ondansetron (ZOFRAN) Injection 4 mg, IV, Q8H PRN  Promethazine (PHENERGAN) Injection 12.5 mg, IV, Q6H PRN        Intake  and Output  Last Two Completed Shifts  In: 2065 [Oral:990; Crystalloid:1075]  Out: 2500 [Urine:2500]    Current Shift  In: -   Out: 1800 [Urine:1800]    PHYSICAL EXAM  Gral: cachetic young female with sunken eyes  HEENT: PERRL, EOMI, op clear  Neck: supple  Chest: CTAB, nl resp effort  CV: RRR, no m/r/g  Abd: soft, J tube site c/d/i, no sig tenderness to palpation, +BS  Ext: no edema  Neuro: AAOX3, non focal    CURRENT VITALS      MIN - MAX  BP: (!) 87/53 (no dizziness,asymptomatic)      BP: (78-92)/(44-60)   Pulse: 64      Pulse Min: 56 Max: 74  Resp: 16      Resp Min: 12 Max: 18  SpO2: 99 %      SpO2 Min: 96 % Max: 100 %  Temp: 36.6 C (97.9 F)      Temp Min: 36.1 C (97 F) Max: 36.7 C (98 F)  Weight: 39.1 kg (86 lb 3.2 oz)  SpO2: 99 %  Flow (L/min): 15  Pulse: 64    Lines & Drains: none    LABS:    CBC  Recent labs for the past 72 hours     04/27/17 0025 04/26/17 0643    WHITE BLOOD CELL COUNT 5.9 5.6    HEMOGLOBIN  11.1* 10.8*    HEMATOCRIT 32.4* 31.3*    MCV 92.5 92.1    PLATELET COUNT 271 286      CHEMISTRY  Recent labs for the past 72 hours     04/27/17 0025 04/26/17 0643    SODIUM 139 136    POTASSIUM 3.9 3.8    CHLORIDE 103 101    CARBON DIOXIDE TOTAL 30 26    CREATININE BLOOD 0.54 0.56    UREA NITROGEN, BLOOD (BUN) 5* 3*    GLUCOSE 99 77    CALCIUM 8.5* 9.1    PHOSPHORUS (PO4) 4.1 3.7    MAGNESIUM (MG) 1.8 1.8          No results found for this basename: INR:* in the last 72 hours     MICROBIOLOGY:  UA neg    IMAGING  CXR -   1. No acute cardiopulmonary disease.    NCHCT  No acute intracranial hemorrhage or mass effect.    J tube replacement 8/28  A new 12 F x 25 cm (cut to fit) J tube replaced the stoma measurement device that was placed at the jejunostomy tube.     OSH EEG (04/17/17)  IMPRESSION:This is an abnormal EEG due to the presence of   excessive fast activity as well as some underlying slowing.     ASSESSMENT AND PLAN     21yo female with MMP including hx of recurrent  admissions with pseudoseizure, diamond-blackckfan anemia s/p BMT, chronic GVHD, SMA syndrome, gastroparesis, severe malnutrition s/p J tube placement 8/15, PTSD presenting with somnolence after pt was given high doses of benzo due to concern for possible seizure. Pt was initially admitted to MICU for observation and transferred to the floor on 8/28. Pt also with leaking J tube due to animal bite.     # Somnolence due to benzos, resolved  Pt had 24mg  of versed by EMS and 2mg  of Ativan in ED.  Somnolence has since resolved, per Neurology recommending against further benzodiazepines for seizure like activity as patient having pseudoseizures.      # Pseudoseizure. Pt with multiple hospitalizations and prior neuro eval with EEG. NCHCT neg  - pt with neg EEG in the past but pt is still on keppra and valproic acid  - neuro recommends against treating the pseudoseizure. Trial of NS eye drops if having convulsions.     #Syncope  #POTS  Patient with a history of POTS, she states that she is on salt tabs for the treatment of this, unable to take currently due to J tube malfunction.  Per patient she had a positive tilt table test in the past.  Additional fall yesterday when working with PT, per PT no prodrome just sudden loss of consciousness.  EKG on 8/26 normal.  Currently monitoring on telemetry without any concerning events, will also check echo.  Will also provide compression stockings for mechanical support.     -Telemetry monitoring for 24 hours  -echo  -Compression stockings ordered through orthotics  -PT eval    # Leukocytosis, resolved  # Diarrhea. abd exam is benign  WBC resolved and is WNL without any intervention. Afebrile. Neg UA/CXR  - f/u C.diff    # severe protein calorie malnutrition. unintentional weight loss over past 1 year and she's being evaluated at [facility]  # J tube leak due to animal bite (chewed by [place] pig). It was exchanged by IR on 8/28 although now clogged, IR aware will evaluate.   - trial  of  J tube tolerance  - nutrition consult   - outpt f/u for further workup of weight loss    # Difficulty with access  - unable to place pIV even with US. Midline requested     # Gastroparesis  - cont erythromycin    # Depression. Stable  - cont prozac    # Asthma. No e/o exacerbation  - cont singulair    # FEN: mechanical diet as tolerated. J tube as tolerated    # DVT Prophylaxis:  Heparin sq bid    # Code Status: FULL    # Dispo: [place] boyfriend's family. [place] from [place] 3 w[place] ago. [place] family [place].    Cyril Loosenianyi Hjalmer Iovino, MD Hospitalist  Pager: [phone]  PI: [phone]

## 2017-04-27 NOTE — Nurse Focus (Signed)
MD Ahov PI# [id number] notified and aware BP 79/47.  No NAD noted.  Pt AAox4.  Reports dizziness.  Rec increase in fluids and a bolus.  Will continue to monitor.

## 2017-04-27 NOTE — Plan of Care (Addendum)
Problem: Patient Care Overview (Adult)  Goal: Plan of Care Review  Outcome: Ongoing (interventions implemented as appropriate)  Goal Outcome Evaluation Note    Chloe Barron 11Yd is a [age]yr female admitted 04/23/2017    OUTCOME SUMMARY AND PLAN MOVING FORWARD:   Pt AAOx4.  RA.  NSR.  Had two episodes of BP 70's/40's; dizzy, but remained AAO throughout, NAD noted, so MD made aware and increased IVF rate and (2) 500cc boluses given for a final BP of 84/48.  Adequate UOP.  [facility].  Awaiting for jtube to be de-clotted.  Will continue to monitor.    Problem: Sleep Pattern Disturbance (Adult)  Goal: Adequate Sleep/Rest  Patient will demonstrate the desired outcomes by discharge/transition of care.   Outcome: Ongoing (interventions implemented as appropriate)      Problem: Pressure Ulcer Risk (Braden Scale) (Adult,Obstetrics,Pediatric)  Goal: Skin Integrity  Patient will demonstrate the desired outcomes by discharge/transition of care.   Outcome: Ongoing (interventions implemented as appropriate)      Problem: Pain, Acute (Adult)  Goal: Acceptable Pain Control/Comfort Level  Patient will demonstrate the desired outcomes by discharge/transition of care.   Outcome: Ongoing (interventions implemented as appropriate)      Problem: Nutrition, Enteral (Adult)  Goal: Signs and Symptoms of Listed Potential Problems Will be Absent or Manageable (Nutrition, Enteral)  Signs and symptoms of listed potential problems will be absent or manageable by discharge/transition of care (reference Nutrition, Enteral (Adult) CPG).   Outcome: Ongoing (interventions implemented as appropriate)

## 2017-04-27 NOTE — Plan of Care (Signed)
Problem: Patient Care Overview (Adult)  Goal: Plan of Care Review  Outcome: Ongoing (interventions implemented as appropriate)    Goal: Individualization and Mutuality  Outcome: Ongoing (interventions implemented as appropriate)    Goal: Discharge Needs Assessment  Outcome: Ongoing (interventions implemented as appropriate)      Problem: Sleep Pattern Disturbance (Adult)  Goal: Adequate Sleep/Rest  Patient will demonstrate the desired outcomes by discharge/transition of care.   Outcome: Ongoing (interventions implemented as appropriate)      Problem: Pressure Ulcer Risk (Braden Scale) (Adult,Obstetrics,Pediatric)  Goal: Skin Integrity  Patient will demonstrate the desired outcomes by discharge/transition of care.   Outcome: Ongoing (interventions implemented as appropriate)      Problem: Pain, Acute (Adult)  Goal: Acceptable Pain Control/Comfort Level  Patient will demonstrate the desired outcomes by discharge/transition of care.   Outcome: Ongoing (interventions implemented as appropriate)      Problem: Pressure Ulcer Risk (Braden Scale) (Adult,Obstetrics,Pediatric)  Goal: Skin Integrity  Patient will demonstrate the desired outcomes by discharge/transition of care.   Outcome: Ongoing (interventions implemented as appropriate)      Problem: Nutrition, Enteral (Adult)  Goal: Signs and Symptoms of Listed Potential Problems Will be Absent or Manageable (Nutrition, Enteral)  Signs and symptoms of listed potential problems will be absent or manageable by discharge/transition of care (reference Nutrition, Enteral (Adult) CPG).   Outcome: Ongoing (interventions implemented as appropriate)

## 2017-04-27 NOTE — Nurse Assessment (Signed)
ASSESSMENT NOTE    Note Started: 04/26/2017, 19:15     Initial assessment completed and recorded in EMR.  Report received from day shift nurse and orders reviewed. Plan of Care reviewed and appropriate, discussed with patient.  Sherissa Tenenbaum, RN

## 2017-04-27 NOTE — Rapid/Action RN Consult (Addendum)
04/27/2017  22:23    Rapid Response Team Note    Called to patient's bedside on 04/27/2017 at 2150 for seizure like acitivity.  Arrived at bedside with primary RN and secondary RN's at bedside.  Vital signs: HR 80, BP 109/71, RR 18, O2 sat 100% on 5 liters NC, FSBG: 95.  Patient laying on side with all four extremities moving back and forth, per RN's patient shaking for approximately 2 minutes.  Airway maintained and safety equipment at bedside.  Patient eyes turned up and patient intermittently blinking.  Bilateral upper extremities remain at side, stiff, fists clenched. Bilateral upper and lower extremities twitching/shaking back and forth. PERLA.  Does not make eye contact when name called.  Patient localizes painful stimuli, saline eye drops applies to bilateral eyes and within one minute status post seizure like activity patient able to follow simple commands and resist/localize painful stimuli. Patient returns back to baseline responding to commands, tracking, and patient whispers answers to questions.  Moves all extremities x4, palpable pulses x4, cap refill less than three seconds x4, all fingers and toes warm pink and dry.  MD Elvin SoShelly Choi at bedside to assess patient.  Seizure precautions maintained and padding to side rails in place x4.  Scheduled medications given as ordered.        Rapid Response team will continue to follow patient.    Christene SlatesSusan Elius Etheredge, RN  Action Nurse/Rapid [place]Team

## 2017-04-27 NOTE — Nurse Focus (Signed)
Late entry: 04/26/2017  Found patient on the floor. Patient states about to go to the bathroom. Bed in low position, siderails x2 up/bed down. Assessed for injuries. Mild trauma (some bleeding noted but no active bleeding) injury noted. Dr. Regino SchultzeWang informed of patient fall. Fall Risk re-assessed, current score 70. Fall Risk band on. Patient AAOx4. Impulse control and judgment good. Ability to communicategood. Patient has not reported difficulty with ambulation or balance. Staff has not observed difficulty with ambulation or balance. Patient uses no ambulatory aids. Discussed fall prevention with pt. Patient specific interventions include calling the nurse at all times, call light within reach. See Patient Education section: Fall/Injury topic for patient education, literature and resources provided to patient. Patient states she understands information and instructions for personal safety. See Fall/Trauma/Injury CPG.

## 2017-04-27 NOTE — Progress Notes (Signed)
Brief event Note:  MD called to bedside as pt fainted during standing up to use the commode, she fell on to the nurse who was assisting, no trauma noted. When MD was initially called, pt was "unconscious" with pulse, lying on bed, no spontaneous movement. VS pending.  While on route, MD was called again now pt is "seizing" - per chart, pt with hx of pseudoseizure which breaks with eye drops per neurology rec - neurology recommend AGAINST benzo.    VS wnl.  When MD at the bedside, pt eyes are open, little tremor noted on b/l legs with also non congruent nor rhythmic movements, no stiffness, no jerking. Pt does not respond to MD's question. Upon dropping the eye drop, pt blinks but does not follow commands, does blink with when MD pretended to drop her hand in front of pt's eye - pt is clearly not having a clinical seizure and alert. In few seconds, pt started to move her legs but still refused to follow or answered questions. MD continued to provide emotional support through the event, pt eventually shook her head reporting no dizziness no pain. When asked if she wants blanket, she reports yes. She held MD's hand and RN's hand at the bedside. Now conversing and reporting does not remember anything. MD encouraged pt to rest for the night and sit up slowly when feeling ready. Pt agreed.    BP 97/59  Pulse 83  Temp 36.4 C (97.6 F) (Oral)  Resp 18  Ht 1.524 m (5')  Wt 39.1 kg (86 lb 3.2 oz)  LMP 04/21/2017 (Approximate)  SpO2 100%  BMI 16.83 kg/m2    General and neuro exam: as above.   Pulm: Clear to auscultation bilaterally, no wheezes/rhonchi.  CV: Regular rate and rhythm, no murmurs/rubs/gallops  Abd: Soft, non-tender, non-distended. +BS.  Ext: No cyanosis, clubbing, or edema, 2+ dorsalis pedis pulses.      Assessment/plan:  # pseudoseizure  - no further management needed  - continous emotional support  - if recurrent event, trial of eye drop and emotional support  - hold benzo per neurology assessment in  the past and this MD's current assessment that his is not a clinical seizure episode. (pt had several negative EEG per chart review)    Concha PyoShelley J. Alvino Chapelhoi, MD   Hospitalist Attending Physician   Internal Medicine/Hospitalist Division   PI#: [id number]  Pager #: [phone]

## 2017-04-27 NOTE — Nurse Assessment (Signed)
ASSESSMENT NOTE    Note Started: 04/27/2017, 08:00     Initial assessment completed and recorded in EMR.  Report received from night shift nurse and orders reviewed. Plan of Care reviewed and appropriate, discussed with patient. VSS,assisted w/ needs,call light w/ in reach. Hoke Baer, RN RN

## 2017-04-28 ENCOUNTER — Inpatient Hospital Stay (HOSPITAL_COMMUNITY): Payer: MEDICAID

## 2017-04-28 ENCOUNTER — Inpatient Hospital Stay (HOSPITAL_COMMUNITY): Payer: MEDICAID | Admitting: Student in an Organized Health Care Education/Training Program

## 2017-04-28 ENCOUNTER — Inpatient Hospital Stay: Payer: MEDICAID | Admitting: Student in an Organized Health Care Education/Training Program

## 2017-04-28 DIAGNOSIS — K9423 Gastrostomy malfunction: Secondary | ICD-10-CM

## 2017-04-28 DIAGNOSIS — Z434 Encounter for attention to other artificial openings of digestive tract: Secondary | ICD-10-CM

## 2017-04-28 DIAGNOSIS — K9413 Enterostomy malfunction: Secondary | ICD-10-CM

## 2017-04-28 LAB — RENAL FUNCTION PANEL
ALBUMIN: 2.9 g/dL — AB (ref 3.4–4.8)
CALCIUM: 8.9 mg/dL (ref 8.6–10.5)
CARBON DIOXIDE TOTAL: 27 mmol/L (ref 24–32)
CHLORIDE: 102 mmol/L (ref 95–110)
CREATININE BLOOD: 0.66 mg/dL (ref 0.44–1.27)
GLUCOSE: 79 mg/dL (ref 70–99)
PHOSPHORUS (PO4): 4.9 mg/dL (ref 2.4–5.0)
POTASSIUM: 4 mmol/L (ref 3.3–5.0)
SODIUM: 137 mmol/L (ref 135–145)
UREA NITROGEN, BLOOD (BUN): 6 mg/dL — AB (ref 8–22)

## 2017-04-28 LAB — CBC WITH DIFFERENTIAL
BASOPHILS % AUTO: 1 %
BASOPHILS ABS AUTO: 0.1 10*3/uL (ref 0.0–0.2)
EOSINOPHIL % AUTO: 11.6 %
EOSINOPHIL ABS AUTO: 0.7 10*3/uL — AB (ref 0.0–0.5)
HEMATOCRIT: 33.9 % — AB (ref 36.0–46.0)
HEMOGLOBIN: 11.7 g/dL — AB (ref 12.0–16.0)
LYMPHOCYTE ABS AUTO: 2.4 10*3/uL (ref 1.2–5.2)
LYMPHOCYTES % AUTO: 38.4 %
MCH: 32.3 pg (ref 27.0–33.0)
MCHC: 34.6 % (ref 32.0–36.0)
MCV: 93.5 UM3 (ref 80.0–100.0)
MONOCYTES % AUTO: 6.3 %
MONOCYTES ABS AUTO: 0.4 10*3/uL (ref 0.1–0.8)
MPV: 7.3 UM3 (ref 6.8–10.0)
NEUTROPHIL ABS AUTO: 2.6 10*3/uL (ref 1.8–8.0)
NEUTROPHILS % AUTO: 42.7 %
Platelet Count: 296 10*3/uL (ref 130–400)
RDW: 14.7 % (ref 0.0–14.7)
RED CELL COUNT: 3.63 10*6/uL — AB (ref 4.10–5.10)
WHITE BLOOD CELL COUNT: 6.2 10*3/uL (ref 4.5–13.0)

## 2017-04-28 LAB — ELECTROCARDIOGRAM WITH RHYTHM STRIP: QTC: 474

## 2017-04-28 LAB — MAGNESIUM (MG): MAGNESIUM (MG): 1.9 mg/dL (ref 1.5–2.6)

## 2017-04-28 LAB — CULTURE BLOOD, BACTI (INCLUDES YEAST)
CULTURE BLOOD: NO GROWTH
CULTURE BLOOD: NO GROWTH

## 2017-04-28 MED ORDER — INTRAOP PROPOFOL INJ 20 ML VIAL (BOLUS+INFUSION)
Status: DC | PRN
Start: 2017-04-28 — End: 2017-04-28
  Administered 2017-04-28: 20 mg via INTRAVENOUS
  Administered 2017-04-28: 30 mg via INTRAVENOUS
  Administered 2017-04-28: 20 mg via INTRAVENOUS
  Administered 2017-04-28 (×3): 10 mg via INTRAVENOUS

## 2017-04-28 MED ORDER — INTRAOP LIDOCAINE PF 2% INJ 5 ML VIAL
Status: DC | PRN
Start: 2017-04-28 — End: 2017-04-28
  Administered 2017-04-28: 3 mL via INTRAVENOUS

## 2017-04-28 MED ORDER — HYDROMORPHONE 1 MG/ML INJECTION SYRINGE
0.2000 mg | INJECTION | INTRAMUSCULAR | Status: DC | PRN
Start: 2017-04-28 — End: 2017-04-29
  Administered 2017-04-28 – 2017-04-29 (×4): 0.2 mg via INTRAVENOUS
  Filled 2017-04-28: qty 0.5
  Filled 2017-04-28: qty 1
  Filled 2017-04-28: qty 0.5

## 2017-04-28 MED ORDER — ONDANSETRON HCL (PF) 4 MG/2 ML INJECTION SOLUTION
4.0000 mg | INTRAMUSCULAR | Status: DC | PRN
Start: 2017-04-28 — End: 2017-04-29

## 2017-04-28 MED ORDER — IOHEXOL 300 MG IODINE/ML INTRAVENOUS SOLUTION 50 ML
INTRAVENOUS | Status: AC
Start: 2017-04-28 — End: 2017-04-28
  Administered 2017-04-28: 15:00:00

## 2017-04-28 MED ORDER — INTRAOP FENTANYL 50 MCG/ML INJ 5 ML AMP
Status: DC | PRN
Start: 2017-04-28 — End: 2017-04-28
  Administered 2017-04-28: 25 ug via INTRAVENOUS

## 2017-04-28 MED ORDER — INTRAOP DEXMEDETOMIDINE 100 MCG/ML IV BOLUS DOSE
Status: DC | PRN
Start: 2017-04-28 — End: 2017-04-28
  Administered 2017-04-28 (×2): 20 ug via INTRAVENOUS

## 2017-04-28 MED ORDER — ACETAMINOPHEN 325 MG TABLET
650.0000 mg | ORAL_TABLET | Freq: Once | ORAL | Status: AC
Start: 2017-04-28 — End: 2017-04-28
  Administered 2017-04-28: 650 mg via JEJUNOSTOMY
  Filled 2017-04-28: qty 2

## 2017-04-28 MED ORDER — METOCLOPRAMIDE 5 MG/ML INJECTION SOLUTION
10.0000 mg | INTRAMUSCULAR | Status: DC | PRN
Start: 2017-04-28 — End: 2017-05-03
  Filled 2017-04-28: qty 2

## 2017-04-28 NOTE — Plan of Care (Signed)
Problem: Patient Care Overview (Adult)  Goal: Plan of Care Review  Outcome: Ongoing (interventions implemented as appropriate)    Goal: Individualization and Mutuality  Outcome: Ongoing (interventions implemented as appropriate)    Goal: Discharge Needs Assessment  Outcome: Ongoing (interventions implemented as appropriate)      Problem: Sleep Pattern Disturbance (Adult)  Goal: Adequate Sleep/Rest  Patient will demonstrate the desired outcomes by discharge/transition of care.   Outcome: Ongoing (interventions implemented as appropriate)      Problem: Pressure Ulcer Risk (Braden Scale) (Adult,Obstetrics,Pediatric)  Goal: Skin Integrity  Patient will demonstrate the desired outcomes by discharge/transition of care.   Outcome: Ongoing (interventions implemented as appropriate)      Problem: Pain, Acute (Adult)  Goal: Acceptable Pain Control/Comfort Level  Patient will demonstrate the desired outcomes by discharge/transition of care.   Outcome: Ongoing (interventions implemented as appropriate)      Problem: Pressure Ulcer Risk (Braden Scale) (Adult,Obstetrics,Pediatric)  Goal: Skin Integrity  Patient will demonstrate the desired outcomes by discharge/transition of care.   Outcome: Ongoing (interventions implemented as appropriate)      Problem: Nutrition, Enteral (Adult)  Goal: Signs and Symptoms of Listed Potential Problems Will be Absent or Manageable (Nutrition, Enteral)  Signs and symptoms of listed potential problems will be absent or manageable by discharge/transition of care (reference Nutrition, Enteral (Adult) CPG).   Outcome: Ongoing (interventions implemented as appropriate)

## 2017-04-28 NOTE — Anesthesia Preprocedure Evaluation (Addendum)
Anesthesia Evaluation    History of Present Illness  Airway   Mallampati: II  TM distance: >3 FB     Neck ROM is full.     Dental - no notable dental history.    Pulmonary - normal exam Cardiovascular - cardiovascular exam normal   Neuro/Psych     GI/Hepatic/Renal   (-) scaphoid       Endo/Other  ,                        Anesthesia Plan    ASA 3     MAC     Anesthetic plan and risks discussed with patient.  Resident/Fellow/CRNA discussed the plan with the attending.  I personally performed a physical assessment on this patient.        Anesthesia Evaluation    Chloe Barron is a 21yrold female presenting for j tube exchange.    History of Present Illness  Adrenal insufficiency  Diamond-Blackfan anemia  Seizure disorder     Lab Results       Lab Name                 Value               Date/Time                  WBC                      5.5                 04/12/2017 04:55 AM        HGB                      11.0 (L)            04/12/2017 04:55 AM        HCT                      33.1 (L)            04/12/2017 04:55 AM        PLT                      300                 04/12/2017 04:55 AM   Lab Results       Lab Name                 Value               Date/Time                  NA                       139                 04/12/2017 04:55 AM        K                        3.2 (L)             04/12/2017 04:55 AM        CL  109                 04/12/2017 04:55 AM        CO2                      24                  04/12/2017 04:55 AM        BUN                      5 (L)               04/12/2017 04:55 AM        CR                       0.56                04/12/2017 04:55 AM        GLU                      90                  04/12/2017 04:55 AM    Lab Results       Lab Name                 Value               Date/Time                  INR                      1.06                04/12/2017 08:10 AM   Airway   Mallampati: II  TM distance: >3 FB   Neck ROM is full. Dental    (+) poor  dentition   Pulmonary - normal exam  (+) asthma,  Cardiovascular   (+) hypertension (),      Rhythm: regular  Rate: normal   Neuro/Psych    (+) seizures (),  GI/Hepatic/Renal   GI exam normal  (+) GERD (well controlled),    Endo/Other  ,                  Anesthesia Plan    ASA 3     MAC sedation with precedex and propofol    intravenous induction   I personally performed a physical assessment on this patient.

## 2017-04-28 NOTE — Brief Op Note (Signed)
VASCULAR & INTERVENTIONAL RADIOLOGY    ATTENDING BRIEF OPERATIVE NOTE    Date and time of Service: 04/28/2017, 14:40    Pre-Op Diagnosis:  J-tube dysfunction    Post-Op Diagnosis:  Same    Procedure Performed/Description:  J-tube exchange and upsizing under fluoro-guidance    Name of Interventional Radiologist and Assistants:    1. Narda Bondsex Sheilyn Boehlke, MD    Type of Anesthesia:  GA    Findings:  Existing J-tube is occluded proximally. New J-tube in satisfactory position.    Specimens Removed:  None    EBL:  None    Complications:  None    Outcome: Patient tolerated the procedure well and developed no new complaints. The patient remained in stable condition throughout the procedure.    Operating Room Procedure Presence: I was physically present for the entire procedure.    The information contained on this form is true and accurate to the best of my knowledge.  Further, I understand that if I misrepresent, falsify or conceal information regarding my participation in the professional service described above, I may be subject to fine, imprisonment, or civil penalty under applicable federal laws.    Narda Bondsex Kabe Mckoy, MD  Attending  Vascular & Interventional Radiology   Pager: [phone]

## 2017-04-28 NOTE — Pre Sedation (Signed)
INTERVENTIONAL RADIOLOGY  Brief H&P for Procedure with Sedation    04/28/2017, 11:29    HISTORY  21 year old female with extensive medical history, including gastroparesis and SMA syndrome diagnosed at [facility], where she underwent a laparoscopic J-tube placement on August 15. The procedure was uncomplicated and the patient was doing relatively well at home. J tube was then damaged by the patient's pet hamster. She underwent replacement with IR 8/28 12 F x 25 cm (cut to fit) J tube replaced the stoma measurement device that was placed at the jejunostomy tube. J tube is now clogged after medication administration. Plan for exchange and upsize with GA given patient had low blood pressure and patient had difficulty tolerating prior exchange without sedation.     Radiology was consulted for: J tube exchange and upsize    OBJECTIVE  Vitals  Temp: 36.5 C (97.7 F) (08/31 1112)  Temp src: Oral (08/31 1112)  Pulse: 65 (08/31 1112)  BP: 91/58 (08/31 1112)  Resp: 18 (08/31 1112)  SpO2: 99 % (08/31 1112)  Height: 152.4 cm (5') (08/26 2130)  Weight: 39.1 kg (86 lb 3.2 oz) (08/26 2130)    Labs  Lab Results   Lab Name Value Date/Time    WBC 6.2 04/28/2017 04:04 AM    HGB 11.7 (L) 04/28/2017 04:04 AM    HCT 33.9 (L) 04/28/2017 04:04 AM    PLT 296 04/28/2017 04:04 AM     Lab Results   Lab Name Value Date/Time    NA 137 04/28/2017 04:04 AM    K 4.0 04/28/2017 04:04 AM    CL 102 04/28/2017 04:04 AM    CO2 27 04/28/2017 04:04 AM    BUN 6 (L) 04/28/2017 04:04 AM    CR 0.66 04/28/2017 04:04 AM    GLU 79 04/28/2017 04:04 AM    AST 44 (H) 04/23/2017 10:17 PM    ALP 82 04/23/2017 10:17 PM    ALT 76 (H) 04/23/2017 10:17 PM     No results found for: INR, APTT    Physical Exam  General - NAD, lying down in bed, awake, alert, responsive  Heart - non-tachy cardic peripheral pulse  Lungs - CTA b/l  Abdomen - soft, NT, ND. J tube site clear/ dry/ intact    Airway Assessment  See Anesthesia Assessment    Pre-Sedation/Anesthesia  Assessment  See Anesthesia Assessment    ASSESSMENT: 21 year old female with extensive medical history, including gastroparesis and SMA syndrome diagnosed at [facility], where she underwent a laparoscopic J-tube placement on August 15. She underwent replacement with IR 8/28 12 F x 25 cm (cut to fit) J tube replaced the stoma measurement device that was placed at the jejunostomy tube. J tube is now clogged after medication administration. Plan for exchange and upsize with GA given patient had low blood pressure and patient had difficulty tolerating prior exchange without sedation.     RECOMMENDATION: J tube exchange and upsize    CONSENT PROCESS:  The risks, benefits, and alternatives were discussed with the patient, and informed consent was obtained.  I have reviewed the nursing assessment and any initial H&P completed prior to the procedure.  I have evaluated the patient immediately prior to procedure.  I have reviewed the pertinent imaging with the attending physician.    Ericka PontiffSukhraj Paco Cislo, MD  PGY4 Radiology Resident  IR Service Pager: [phone]

## 2017-04-28 NOTE — Nurse Assessment (Signed)
PACU ADMIT NURSING NOTE    Note Started: 04/28/2017, 15:14     Received patient from OR at 1453 hours via gurney.  Monitor and Alarms on.  Patient appears to be having seizure on arrival. Notified primary team, they said to put saline drops in eyes, pt has pseudo siezures per team. VSS. Salvadore OxfordMary Hosea Hanawalt, RN

## 2017-04-28 NOTE — Nurse Transfer Note (Signed)
PACU Transfer Note, HA Accompanying Patient  Note started on 04/28/2017, 16:50    Patient transferred to D12 unit via gurney with room air at 1650 hours.  Verbal report given to charge RN, report in EMR and RN notified of arrival. Assessment unchanged. Salvadore OxfordMary Valyn Latchford, RN

## 2017-04-28 NOTE — Nurse Focus (Addendum)
Pt paged RN to bedside for assistance oob. Requested to use commode. An RN arrived to bedside to assist pt to commode.  Upon getting off the commode, pt became still and staring into space. Pt fell on RN, who assisted the pt to the ground. Pt Remained unconscious, not responding. Tele monitored with no abnormal activity. Pt was assisted back into bed, persisted to be non responsive. VSS. EKG done. Placed pt on O2, 5L NC for supp o2. Pt remained unresponsive for 15 minutes, with occasional eye flickering, opening and closing. Began to shake back and forth for 2 minutes. VSS, remained unchanged. Pt was placed on her side. MD, Rapid, Charge, and associate RNs at bedside for assistance.     Orders for Eye drops, given, and pt began to feel better.      Upon regaining consciousness, pt reports no recollection of event. Denies pain anywhere else in her body, (except baseline pain to abdomen at GT). RN reported that pt fell on top of her, and she assisted pt to the ground gently . No bruising or skin breakdown noted on pts body. VSS.     Will continue to monitor pt. Olen PelSusanna Shahil Speegle, RN

## 2017-04-28 NOTE — Anesthesia Postprocedure Evaluation (Signed)
Patient: Chloe Barron 11Yd    [place]    Anesthesia Type: MAC    Vital Signs (Last Recorded):  BP: (!) 85/50 (04/28/17 1600)  Pulse: 59 (04/28/17 1600)  Resp: 13 (04/28/17 1600)  Temp Max: 36.6 C (97.9 F)  (Last 24 hours)  Temp: 36.2 C (97.2 F) (04/28/17 1515)  SpO2: 96 % (04/28/17 1600) on    O2 Device: room air (04/28/17 1530)      Anesthesia Post Evaluation    Procedure: * No procedures listed *  Location: [facility] (Don't Schedule)  Anesthesia: Monitored Anesthesia Care    Patient location during evaluation: PACU  Patient participation: complete - patient participated  Level of consciousness: sleepy but conscious  Pain management: adequate  Airway patency: patent  Anesthetic complications: no  Cardiovascular status: stable  Respiratory status: spontaneous ventilation and room air  Hydration status: euvolemic  Nausea and Vomiting: absent     Arbie Cookey, MD          Patient seen and assessed bedside; no active issues. Stable for transfer to the floor.     Electronically signed by:   Arbie Cookey, MD  Resident Physician CA3, PGY4  Department of Anesthesiology & Pain Medicine  PI: [phone], Pager #: [phone]  04/28/2017  16:27

## 2017-04-28 NOTE — Nurse Assessment (Signed)
ASSESSMENT NOTE    Note Started: 04/28/2017, 22:00     Initial assessment completed and recorded in EMR.  Report received from day shift nurse and orders reviewed. Plan of Care reviewed and appropriate, discussed with patient.  Daphene CalamityGrace Angenette Daily,  RN

## 2017-04-28 NOTE — Nurse Assessment (Signed)
ASSESSMENT NOTE    Note Started: 04/28/2017, 08:00     Initial assessment completed and recorded in EMR.  Report received from night shift nurse and orders reviewed. Plan of Care reviewed and appropriate, discussed with patient.VSS,assisted w/ needs,call light w/ in reach.  Dalylah Ramey, RN RN

## 2017-04-28 NOTE — Clinical Case Management (Signed)
Clinical Case Management Assessments    Name: Chloe Barron 11YD  MRN: [id number]   Date of Birth: 01/14/1996 ([age]yr) Gender: female    Note Date: 04/28/2017 Note Time: 15:05         ONGOING DISCHARGE PLANNING NOTE    Current functional level of care    Anticipated Lines/Drains/Wounds:   Anticipated IV Medications: No  Anticipated TPN/Tube Feeding: No  Anticipated Barriers to Discharge: None  Anticipated DME: Manual wheelchair, Front wheel walker     Patient/Family offered choice of provider and agreeable with plan/referrals? yes     Plan/Follow-up needed:     Patient's plan of care discussed with Dr. Regino SchultzeWang.  Per MD, patient not medically stable for d/c during the weekend.  Of note, patient has no new discharge needs identified other than her feeding supplies.  CM to continue to follow and work towards getting appropriate discharge plan once new discharge needs are identified.       Date/Time: 04/28/2017 15:05  Electronically signed by:  Carmelia BakeAlberto R. Clayton LefortSanoria, RN, BSN  Department of Clinical Case Management  [facility]  Pager: [phone]

## 2017-04-28 NOTE — Nurse Focus (Signed)
Brought to IR by transport.Verne SpurrFrida Jelisha Weed RN

## 2017-04-28 NOTE — Nurse Assessment (Signed)
ASSESSMENT NOTE          Initial assessment completed and recorded in EMR.  Report received from day shift nurse and orders reviewed. Plan of Care reviewed and appropriate, discussed with patient. Pt resting in bed, bed in low position, call light within reach, safety equipment at bedside. Reminded pt she is unable to get oob without assistance in room.  Pt verbalized will call for all assistance. GT clamped. IVF running.  Olen PelSusanna Brandy Kabat, RN

## 2017-04-28 NOTE — Progress Notes (Signed)
[facility] HOSPITALIST - INTERNAL MEDICINE DAILY PROGRESS NOTE    Patient: Chloe Barron 11Yd 22yr female Date of Admission: 04/23/2017   MRN: [id number] Length of Stay: . LOS: 5 days    Room: 12759/127591 Date of Service: 04/28/2017     24 EVENTS / SUBJECTIVE:    Pseudoseizure episode overnight, see cross cover note.  No complaints this morning, states that she is hopeful to have J tube replaced soon as she would like to go back to [place] to be with her family.       ROS: as above    OBJECTIVE:    Scheduled Medications:    Current Facility-Administered Medications:  Enoxaparin (LOVENOX) Injection 30 mg SUBCUTANEOUS Q24H   Erythromycin (ERY-TAB) Delayed Release Tablet 250 mg ORAL TID w/ meals   FamoTIDine (PEPCID) Tablet 20 mg ORAL BID   Fluoxetine (PROZAC) 20 mg/5 mL Solution 60 mg ORAL QAM   Iohexol (OMNIPAQUE) 300 mg/mL Injection for Oral/Enteral Tube/Rectal Use 5 mL Oral/Enteral Tube/Rectal IMAGING X 1   LevETIRAcetam (KEPPRA) 100 mg/mL Solution 750 mg ORAL BID   Montelukast (SINGULAIR) Tablet 10 mg ORAL Daily Bedtime   Pregabalin (LYRICA) Capsule 75 mg ORAL TID   Valproate Sodium (DEPAKENE) 250 mg/5 mL Solution 250 mg ORAL BID     Continuous Medications:    D5 / 0.45% NaCl  Last Rate: 100 mL/hr at 04/28/17 0707     PRN Medications:  Acetaminophen (TYLENOL) Tablet 650 mg, ORAL, Q4H PRN  Carboxymethylcellulose (CELLUVISC) Ophthalmic Solution 1 drop, BOTH Eyes, Q1H PRN  DiphenhydrAMINE (BENADRYL) Injection 25 mg, IV, Q6H PRN  Hydrocodone 7.5 mg-Acetaminophen 325 mg/15 mL (HYCET) Solution 10 mL, ORAL, Q4H PRN  Lidocaine (L-M-X4) 4 % Cream, TOPICAL-site required, PRN  Lidocaine (XYLOCAINE) 10 mg/mL (1%) Injection 0.1-2 mL, INTRADERMAL, PRN  Lidocaine (XYLOCAINE) 10 mg/mL (1%) Injection 0.5-20 mL, INFILTRATION, PRN  Lidocaine cream REMOVAL, TOPICAL-site required, PRN  Ondansetron (ZOFRAN) Injection 4 mg, IV, Q8H PRN  Promethazine (PHENERGAN) Injection 12.5 mg, IV, Q6H PRN        Intake and Output  Last Two Completed  Shifts  In: 2996.7 [Oral:350; Crystalloid:2646.7]  Out: 4300 [Urine:4300]    Current Shift  In: 825 [Crystalloid:825]  Out: 1250 [Urine:1250]    PHYSICAL EXAM  Gral: cachetic young female with sunken eyes  HEENT: PERRL, EOMI, op clear  Neck: supple  Chest: CTAB, nl resp effort  CV: RRR, no m/r/g  Abd: soft, J tube site c/d/i, no sig tenderness to palpation, +BS  Ext: no edema  Neuro: AAOX3, non focal    CURRENT VITALS      MIN - MAX  BP: 91/58      BP: (85-109)/(47-71)   Pulse: 65      Pulse Min: 62 Max: 86  Resp: 18      Resp Min: 16 Max: 18  SpO2: 99 %      SpO2 Min: 95 % Max: 100 %  Temp: 36.6 C (97.9 F)      Temp Min: 36 C (96.8 F) Max: 36.6 C (97.9 F)  Weight: 39.1 kg (86 lb 3.2 oz)  SpO2: 99 %  Flow (L/min): 5  Pulse: 65    Lines & Drains: none    LABS:    CBC  Recent labs for the past 72 hours     04/28/17 0404 04/27/17 0025 04/26/17 0643    WHITE BLOOD CELL COUNT 6.2 5.9 5.6    HEMOGLOBIN 11.7* 11.1* 10.8*    HEMATOCRIT 33.9* 32.4* 31.3*  MCV 93.5 92.5 92.1    PLATELET COUNT 296 271 286      CHEMISTRY  Recent labs for the past 72 hours     04/28/17 0404 04/27/17 0025 04/26/17 0643    SODIUM 137 139 136    POTASSIUM 4.0 3.9 3.8    CHLORIDE 102 103 101    CARBON DIOXIDE TOTAL 27 30 26     CREATININE BLOOD 0.66 0.54 0.56    UREA NITROGEN, BLOOD (BUN) 6* 5* 3*    GLUCOSE 79 99 77    CALCIUM 8.9 8.5* 9.1    PHOSPHORUS (PO4) 4.9 4.1 3.7    MAGNESIUM (MG) 1.9 1.8 1.8          No results found for this basename: INR:* in the last 72 hours     MICROBIOLOGY:  UA neg    IMAGING  CXR -   1. No acute cardiopulmonary disease.    NCHCT  No acute intracranial hemorrhage or mass effect.    J tube replacement 8/28  A new 12 F x 25 cm (cut to fit) J tube replaced the stoma measurement device that was placed at the jejunostomy tube.     OSH EEG (04/17/17)  IMPRESSION:This is an abnormal EEG due to the presence of   excessive fast activity as well as some underlying slowing.     ASSESSMENT AND PLAN     21yo  female with MMP including hx of recurrent admissions with pseudoseizure, diamond-blackckfan anemia s/p BMT, chronic GVHD, SMA syndrome, gastroparesis, severe malnutrition s/p J tube placement 8/15, PTSD presenting with somnolence after pt was given high doses of benzo due to concern for possible seizure. Pt was initially admitted to MICU for observation and transferred to the floor on 8/28. Pt also with leaking J tube due to animal bite.     # Somnolence due to benzos, resolved  Pt had 24mg  of versed by EMS and 2mg  of Ativan in ED.  Somnolence has since resolved, per Neurology recommending against further benzodiazepines for seizure like activity as patient having pseudoseizures.      # Pseudoseizure. Pt with multiple hospitalizations and prior neuro eval with EEG. NCHCT neg  - pt with neg EEG in the past but pt is still on keppra and valproic acid  - neuro recommends against treating the pseudoseizure. Trial of NS eye drops if having convulsions.     #Syncope  #POTS  Patient with a history of POTS, she states that she is on salt tabs for the treatment of this, unable to take currently due to J tube malfunction.  Per patient she had a positive tilt table test in the past.  Additional fall yesterday when working with PT, per PT no prodrome just sudden loss of consciousness.  EKG on 8/26 normal.  No events on telemetry x24 hours including during last pseudoseizure episode, echo unremarkable.  Will also provide compression stockings for mechanical support.     -Compression stockings ordered through orthotics  -PT eval    # Leukocytosis, resolved  # Diarrhea. abd exam is benign  WBC resolved and is WNL without any intervention. Afebrile. Neg UA/CXR.  C diff canceled as this is unlikely.     # severe protein calorie malnutrition. unintentional weight loss over past 1 year and she's being evaluated at [facility]  # J tube leak due to animal bite (chewed by [place] pig). It was exchanged by IR on 8/28 although now clogged,  IR planning to exchange today under  general anesthesia.  Plan to restart tube feeds when able and DC patient if able to tolerate TF goal of 55.    - trial of J tube tolerance  - nutrition consult   - outpt f/u for further workup of weight loss    # Difficulty with access  - unable to place pIV even with US. Midline placed     # Gastroparesis  - cont erythromycin    # Depression. Stable  - cont prozac    # Asthma. No e/o exacerbation  - cont singulair    # FEN: mechanical diet as tolerated. J tube as tolerated    # DVT Prophylaxis:  Heparin sq bid    # Code Status: FULL    # Dispo: pt lives in [place] with her boyfriend's family but would like to go . She moved from [place] about 3 wks ago. Her family [place] in [place].    Cyril Loosenianyi Linh Hedberg, MD Hospitalist  Pager: [phone]  PI: [phone]

## 2017-04-28 NOTE — Plan of Care (Signed)
Problem: Patient Care Overview (Adult)  Goal: Plan of Care Review  Outcome: Ongoing (interventions implemented as appropriate)  Goal Outcome Evaluation Note    Ephrata Gong 11Yd is a [age]yr female admitted 04/23/2017    OUTCOME SUMMARY AND PLAN MOVING FORWARD:   VSS, pain controlled, nausea controlled with prn meds. Episode of fainting and pseudo seizure, VSS.  Recommending oob with two rn's, or bedpan.   Goal: Individualization and Mutuality  Outcome: Ongoing (interventions implemented as appropriate)    Goal: Discharge Needs Assessment  Outcome: Ongoing (interventions implemented as appropriate)      Problem: Sleep Pattern Disturbance (Adult)  Goal: Adequate Sleep/Rest  Patient will demonstrate the desired outcomes by discharge/transition of care.   Outcome: Ongoing (interventions implemented as appropriate)      Problem: Pressure Ulcer Risk (Braden Scale) (Adult,Obstetrics,Pediatric)  Goal: Skin Integrity  Patient will demonstrate the desired outcomes by discharge/transition of care.   Outcome: Ongoing (interventions implemented as appropriate)      Problem: Pain, Acute (Adult)  Goal: Acceptable Pain Control/Comfort Level  Patient will demonstrate the desired outcomes by discharge/transition of care.   Outcome: Ongoing (interventions implemented as appropriate)      Problem: Pressure Ulcer Risk (Braden Scale) (Adult,Obstetrics,Pediatric)  Goal: Skin Integrity  Patient will demonstrate the desired outcomes by discharge/transition of care.   Outcome: Ongoing (interventions implemented as appropriate)      Problem: Nutrition, Enteral (Adult)  Goal: Signs and Symptoms of Listed Potential Problems Will be Absent or Manageable (Nutrition, Enteral)  Signs and symptoms of listed potential problems will be absent or manageable by discharge/transition of care (reference Nutrition, Enteral (Adult) CPG).   Outcome: Ongoing (interventions implemented as appropriate)

## 2017-04-29 DIAGNOSIS — K5900 Constipation, unspecified: Secondary | ICD-10-CM

## 2017-04-29 DIAGNOSIS — K9413 Enterostomy malfunction: Secondary | ICD-10-CM

## 2017-04-29 LAB — RENAL FUNCTION PANEL
ALBUMIN: 3.2 g/dL — AB (ref 3.4–4.8)
CALCIUM: 9.2 mg/dL (ref 8.6–10.5)
CARBON DIOXIDE TOTAL: 28 mmol/L (ref 24–32)
CHLORIDE: 101 mmol/L (ref 95–110)
CREATININE BLOOD: 0.65 mg/dL (ref 0.44–1.27)
GLUCOSE: 86 mg/dL (ref 70–99)
PHOSPHORUS (PO4): 5 mg/dL (ref 2.4–5.0)
POTASSIUM: 3.9 mmol/L (ref 3.3–5.0)
SODIUM: 138 mmol/L (ref 135–145)
UREA NITROGEN, BLOOD (BUN): 4 mg/dL — AB (ref 8–22)

## 2017-04-29 LAB — CBC WITH DIFFERENTIAL
BASOPHILS % AUTO: 0.8 %
BASOPHILS ABS AUTO: 0.1 10*3/uL (ref 0.0–0.2)
EOSINOPHIL % AUTO: 8.7 %
EOSINOPHIL ABS AUTO: 0.6 10*3/uL — AB (ref 0.0–0.5)
HEMOGLOBIN: 12.7 g/dL (ref 12.0–16.0)
Hematocrit: 35.8 % — ABNORMAL LOW (ref 36.0–46.0)
LYMPHOCYTE ABS AUTO: 2.4 10*3/uL (ref 1.2–5.2)
LYMPHOCYTES % AUTO: 37 %
MCH: 33 pg (ref 27.0–33.0)
MCHC: 35.3 % (ref 32.0–36.0)
MCV: 93.5 UM3 (ref 80.0–100.0)
MONOCYTES % AUTO: 5.2 %
MONOCYTES ABS AUTO: 0.3 10*3/uL (ref 0.1–0.8)
MPV: 7.4 UM3 (ref 6.8–10.0)
NEUTROPHIL ABS AUTO: 3.1 10*3/uL (ref 1.8–8.0)
NEUTROPHILS % AUTO: 48.3 %
PLATELET COUNT: 312 10*3/uL (ref 130–400)
RDW: 14.1 % (ref 0.0–14.7)
RED CELL COUNT: 3.83 10*6/uL — AB (ref 4.10–5.10)
WHITE BLOOD CELL COUNT: 6.4 10*3/uL (ref 4.5–13.0)

## 2017-04-29 LAB — MAGNESIUM (MG): MAGNESIUM (MG): 1.9 mg/dL (ref 1.5–2.6)

## 2017-04-29 MED ORDER — LORAZEPAM 2 MG/ML ORAL CONCENTRATE
1.0000 mg | Freq: Two times a day (BID) | ORAL | Status: DC | PRN
Start: 2017-04-29 — End: 2017-04-29
  Filled 2017-04-29: qty 0.5

## 2017-04-29 MED ORDER — REMOVE ESTRADIOL PATCH
1.0000 | Status: DC
Start: 2017-04-29 — End: 2017-05-03
  Administered 2017-04-29: 1 via TRANSDERMAL

## 2017-04-29 MED ORDER — ESTRADIOL 0.1 MG/24 HR WEEKLY TRANSDERMAL PATCH
1.0000 | MEDICATED_PATCH | TRANSDERMAL | Status: DC
Start: 2017-04-29 — End: 2017-05-03
  Administered 2017-04-29: 1 via TRANSDERMAL
  Filled 2017-04-29: qty 1

## 2017-04-29 MED ORDER — HYDROMORPHONE 2 MG TABLET
2.0000 mg | ORAL_TABLET | ORAL | Status: DC | PRN
Start: 2017-04-29 — End: 2017-05-03
  Administered 2017-04-29 – 2017-05-03 (×13): 2 mg via ORAL
  Filled 2017-04-29 (×13): qty 1

## 2017-04-29 NOTE — Nurse Focus (Signed)
Chloe Massonhee Vang,RN assisted pt to bedside commode. After voiding pt about to get back in bed, pt suddenly had a syncopal episode for a few mins. RN helped her slide down on the floor and VS checked. RN didn't notice any seizure when it occurred.

## 2017-04-29 NOTE — Progress Notes (Signed)
[facility]    Patient: Chloe Barron 11Yd [age]yr female Date of Admission: 04/23/2017   MRN: [id number] Length of Stay: . LOS: 6 days    Room: 12759/127591 Date of Service: 04/29/2017     24 EVENTS / SUBJECTIVE:    No Pseudoseizure episode overnight. Had brief syncope event this morning. VSS.  Could not tolerate currently tube feeding formula which now on hold. Awaits for dietician input.  [place].       ROS: as above    OBJECTIVE:    Scheduled Medications:    Current Facility-Administered Medications:  Enoxaparin (LOVENOX) Injection 30 mg SUBCUTANEOUS Q24H   Erythromycin (ERY-TAB) Delayed Release Tablet 250 mg ORAL TID w/ meals   Estradiol patch REMOVAL 1 patch Transdermal Q7D AT 2100   Estradiol Weekly (CLIMARA) 0.1 mg/24 hr Patch 1 patch Transdermal Sat   FamoTIDine (PEPCID) Tablet 20 mg ORAL BID   Fluoxetine (PROZAC) 20 mg/5 mL Solution 60 mg ORAL QAM   Iohexol (OMNIPAQUE) 300 mg/mL Injection for Oral/Enteral Tube/Rectal Use 5 mL Oral/Enteral Tube/Rectal IMAGING X 1   LevETIRAcetam (KEPPRA) 100 mg/mL Solution 750 mg ORAL BID   Montelukast (SINGULAIR) Tablet 10 mg ORAL Daily Bedtime   Pregabalin (LYRICA) Capsule 75 mg ORAL TID   Valproate Sodium (DEPAKENE) 250 mg/5 mL Solution 250 mg ORAL BID     Continuous Medications:    D5 / 0.45% NaCl  Last Rate: 100 mL/hr at 04/29/17 0715     PRN Medications:  Acetaminophen (TYLENOL) Tablet 650 mg, ORAL, Q4H PRN  Carboxymethylcellulose (CELLUVISC) Ophthalmic Solution 1 drop, BOTH Eyes, Q1H PRN  DiphenhydrAMINE (BENADRYL) Injection 25 mg, IV, Q6H PRN  Hydrocodone 7.5 mg-Acetaminophen 325 mg/15 mL (HYCET) Solution 10 mL, ORAL, Q4H PRN  Hydromorphone (DILAUDID) Tablet 2 mg, ORAL, Q4H PRN  Lidocaine (L-M-X4) 4 % Cream, TOPICAL-site required, PRN  Lidocaine (XYLOCAINE) 10 mg/mL (1%) Injection 0.1-2 mL, INTRADERMAL, PRN  Lidocaine (XYLOCAINE) 10 mg/mL (1%) Injection 0.5-20 mL, INFILTRATION,  PRN  Lidocaine cream REMOVAL, TOPICAL-site required, PRN  Lorazepam (ATIVAN INTENSOL) 2 mg/mL Concentrated Solution 1 mg, GT, Q12H PRN  Metoclopramide (REGLAN) Injection 10 mg, IV, PRN X 1  Ondansetron (ZOFRAN) Injection 4 mg, IV, Q8H PRN  Promethazine (PHENERGAN) Injection 12.5 mg, IV, Q6H PRN        Intake and Output  Last Two Completed Shifts  In: 1215 [Oral:290; Crystalloid:825; Irrigant:100]  Out: 2650 [Urine:2650]    Current Shift  In: 400 [Oral:360; Irrigant:40]  Out: 1250 [Urine:1250]    PHYSICAL EXAM  Gral: cachetic young female with sunken eyes  HEENT: PERRL, EOMI, op clear  Neck: supple  Chest: CTAB, nl resp effort  CV: RRR, no m/r/g  Abd: soft, J tube site c/d/i, no sig tenderness to palpation, +BS  Ext: no edema  Neuro: AAOX3, non focal    CURRENT VITALS      MIN - MAX  BP: 92/65      BP: (84-94)/(42-65)   Pulse: 79      Pulse Min: 57 Max: 90  Resp: 18      Resp Min: 10 Max: 22  SpO2: 98 %      SpO2 Min: 96 % Max: 100 %  Temp: 36.3 C (97.3 F)      Temp Min: 35.6 C (96 F) Max: 36.6 C (97.9 F)  Weight: 39.1 kg (86 lb 3.2 oz)  SpO2: 98 %  Flow (  L/min): 10  Pulse: 79    Lines & Drains: none    LABS:    CBC  Recent labs for the past 72 hours     04/29/17 0710 04/28/17 0404 04/27/17 0025    WHITE BLOOD CELL COUNT 6.4 6.2 5.9    HEMOGLOBIN 12.7 11.7* 11.1*    HEMATOCRIT 35.8* 33.9* 32.4*    MCV 93.5 93.5 92.5    PLATELET COUNT 312 296 271      CHEMISTRY  Recent labs for the past 72 hours     04/29/17 0710 04/28/17 0404 04/27/17 0025    SODIUM 138 137 139    POTASSIUM 3.9 4.0 3.9    CHLORIDE 101 102 103    CARBON DIOXIDE TOTAL 28 27 30     CREATININE BLOOD 0.65 0.66 0.54    UREA NITROGEN, BLOOD (BUN) 4* 6* 5*    GLUCOSE 86 79 99    CALCIUM 9.2 8.9 8.5*    PHOSPHORUS (PO4) 5.0 4.9 4.1    MAGNESIUM (MG) 1.9 1.9 1.8          No results found for this basename: INR:* in the last 72 hours     MICROBIOLOGY:  UA neg    IMAGING  CXR -   1. No acute cardiopulmonary disease.    NCHCT  No acute  intracranial hemorrhage or mass effect.    J tube replacement 8/28  A new 12 F x 25 cm (cut to fit) J tube replaced the stoma measurement device that was placed at the jejunostomy tube.     OSH EEG (04/17/17)  IMPRESSION:This is an abnormal EEG due to the presence of   excessive fast activity as well as some underlying slowing.     ASSESSMENT AND PLAN     21yo female with MMP including hx of recurrent admissions with pseudoseizure, diamond-blackckfan anemia s/p BMT, chronic GVHD, SMA syndrome, gastroparesis, severe malnutrition s/p J tube placement 8/15, PTSD presenting with somnolence after pt was given high doses of benzo due to concern for possible seizure. Pt was initially admitted to MICU for observation and transferred to the floor on 8/28. Pt also with leaking J tube due to animal bite.     # Somnolence due to benzos, resolved  Pt had 24mg  of versed by EMS and 2mg  of Ativan in ED.  Somnolence has since resolved, per Neurology recommending against further benzodiazepines for seizure like activity as patient having pseudoseizures.      # Pseudoseizure. Pt with multiple hospitalizations and prior neuro eval with EEG. NCHCT neg  - pt with neg EEG in the past but pt is still on keppra and valproic acid  - neuro recommends against treating the pseudoseizure. Trial of NS eye drops if having convulsions.     #Syncope  #POTS  Patient with a history of POTS, she states that she is on salt tabs for the treatment of this, unable to take currently due to J tube malfunction.  Per patient she had a positive tilt table test in the past.  Additional fall 8/30 when working with PT, per PT no prodrome just sudden loss of consciousness.  EKG on 8/26 normal.  No events on telemetry x24 hours including during last pseudoseizure episode, echo unremarkable.  Will also provide compression stockings for mechanical support.     -Compression stockings ordered through orthotics  -PT eval    # Leukocytosis, resolved  # Diarrhea. abd exam  is benign  WBC resolved and is WNL without  any intervention. Afebrile. Neg UA/CXR.  C diff canceled as this is unlikely.     # severe protein calorie malnutrition. unintentional weight loss over past 1 year and she's being evaluated at [facility]    # J tube leak due to animal bite (chewed by [place] pig). It was exchanged by IR on 8/28, clogged, now exchange 8/31 under general anesthesia.  restart tube feeds, however can not tolerate the formula.  plan DC patient if able to tolerate TF goal of 55.    - trial of J tube tolerance  - nutrition consult   - outpt f/u for further workup of weight loss    # Difficulty with access  - unable to place pIV even with [place]. Midline placed     # Gastroparesis  - cont erythromycin    # Depression. Stable  - cont prozac    # Asthma. No e/o exacerbation  - cont singulair    # FEN: mechanical diet as tolerated. J tube as tolerated    # DVT Prophylaxis:  Heparin sq bid    # Code Status: FULL    # Dispo: pt lives in [place] with her boyfriend's family but would like to go to [facility]. She moved from [place] about 3 wks ago. Her family is in [place].    Report Electronically Signed by: Loleta Books, MD Attending  PI: [id number]  Pager: [phone]

## 2017-04-29 NOTE — Nurse Assessment (Signed)
ASSESSMENT NOTE    Note Started: 04/29/2017, 23:06     Initial assessment completed and recorded in EMR.  Report received from day shift nurse and orders reviewed. Plan of Care reviewed and appropriate, discussed with patient.  Daphene CalamityGrace Loan Oguin,  RN

## 2017-04-29 NOTE — Nurse Focus (Signed)
RN Cathren Lainehee was asked by shift RN Delorise ShinerGrace to check on this patient. RN Cathren Lainehee assisted patient to use bedside commode per pt request. Upon getting off the commode, pt became still and staring into space. Pt then leaned herself onto RN and RN helped pt slide gently down to the floor. Pt fainted but came right back shortly and started talking. Several nurses and charge RN called for assistance. Pt got up and back to bed by herself with minimal assistance. No bruising or skin breakdown noted on patient's body. VSS. FSBS wnl.  Delight Ovenshee Kaytlyn Din, Charity fundraiserN.

## 2017-04-29 NOTE — Nurse Assessment (Signed)
ASSESSMENT NOTE    Note Started: 04/29/2017, 07:13     Initial assessment completed and recorded in EMR.  Report received from night shift nurse and orders reviewed. Plan of Care reviewed and updated and appropriate, discussed with patient.  Encouraged patient to utilize call light when assistance is needed.  Patient verbalized understanding and is able to demonstrate use of call light when assistance is needed.  Belongings and call light within reach.  Will continue to monitor.  Maisee Vollman, RN

## 2017-04-29 NOTE — Plan of Care (Signed)
Problem: Patient Care Overview (Adult)  Goal: Plan of Care Review  Outcome: Ongoing (interventions implemented as appropriate)    Goal: Individualization and Mutuality  Outcome: Ongoing (interventions implemented as appropriate)    Goal: Discharge Needs Assessment  Outcome: Ongoing (interventions implemented as appropriate)      Problem: Sleep Pattern Disturbance (Adult)  Goal: Adequate Sleep/Rest  Patient will demonstrate the desired outcomes by discharge/transition of care.   Outcome: Ongoing (interventions implemented as appropriate)      Problem: Pressure Ulcer Risk (Braden Scale) (Adult,Obstetrics,Pediatric)  Goal: Skin Integrity  Patient will demonstrate the desired outcomes by discharge/transition of care.   Outcome: Ongoing (interventions implemented as appropriate)      Problem: Pain, Acute (Adult)  Goal: Acceptable Pain Control/Comfort Level  Patient will demonstrate the desired outcomes by discharge/transition of care.   Outcome: Ongoing (interventions implemented as appropriate)      Problem: Pressure Ulcer Risk (Braden Scale) (Adult,Obstetrics,Pediatric)  Goal: Skin Integrity  Patient will demonstrate the desired outcomes by discharge/transition of care.   Outcome: Ongoing (interventions implemented as appropriate)      Problem: Nutrition, Enteral (Adult)  Goal: Signs and Symptoms of Listed Potential Problems Will be Absent or Manageable (Nutrition, Enteral)  Signs and symptoms of listed potential problems will be absent or manageable by discharge/transition of care (reference Nutrition, Enteral (Adult) CPG).   Outcome: Ongoing (interventions implemented as appropriate)

## 2017-04-29 NOTE — Plan of Care (Signed)
Problem: Patient Care Overview (Adult)  Goal: Plan of Care Review  Outcome: Ongoing (interventions implemented as appropriate)    Goal: Individualization and Mutuality  Outcome: Ongoing (interventions implemented as appropriate)    Goal: Discharge Needs Assessment  Outcome: Ongoing (interventions implemented as appropriate)  Goal Outcome Evaluation Note    Chloe Barron 11Yd is a [age]yr female admitted 04/23/2017    OUTCOME SUMMARY AND PLAN MOVING FORWARD:   Pt calm and cooperative with care.  Pt [facility] this [facility] VS were obtained and stable. MD made aware and no new orders give. Encourage pt to utilize bed pan when in need to urinate or have BM for now. JT remains patent.  Pt wasn't tolerating TF with complete pediatric formula. Dietician came to speak with pt about another formula.  WIll start new formula Vivonex RTF when available.     Problem: Sleep Pattern Disturbance (Adult)  Goal: Adequate Sleep/Rest  Patient will demonstrate the desired outcomes by discharge/transition of care.   Outcome: Ongoing (interventions implemented as appropriate)      Problem: Pressure Ulcer Risk (Braden Scale) (Adult,Obstetrics,Pediatric)  Goal: Skin Integrity  Patient will demonstrate the desired outcomes by discharge/transition of care.   Outcome: Ongoing (interventions implemented as appropriate)      Problem: Pain, Acute (Adult)  Goal: Acceptable Pain Control/Comfort Level  Patient will demonstrate the desired outcomes by discharge/transition of care.   Outcome: Ongoing (interventions implemented as appropriate)      Problem: Pressure Ulcer Risk (Braden Scale) (Adult,Obstetrics,Pediatric)  Goal: Skin Integrity  Patient will demonstrate the desired outcomes by discharge/transition of care.   Outcome: Ongoing (interventions implemented as appropriate)      Problem: Fall Risk (Adult)  Goal: Identify Related Risk Factors and Signs and Symptoms  Related risk factors and signs and symptoms  are identified upon initiation of Human Response Clinical Practice Guideline (CPG)   Outcome: Ongoing (interventions implemented as appropriate)    Goal: Absence of Falls  Patient will demonstrate the desired outcomes by discharge/transition of care.   Outcome: Ongoing (interventions implemented as appropriate)

## 2017-04-29 NOTE — Nurse Focus (Signed)
Pt had syncopal episode after using [facility].  [facility]  Pt suddenly collapsed in RN's arms and RN assisted pt to floor.  Staff emergency called for assistance.  VS obtained and stable.  Pt became awake and alert and placed back to bed.  MD made aware, no further orders noted.  Will continue to monitor.

## 2017-04-29 NOTE — Allied Health Consult (Signed)
NUTRITION ROUNDING    Admission Date: 04/23/2017   Date of Service: 04/29/2017, 13:10     Discussed patient care & clinical status with:   Patient, RN, MD    Reviewed pertinent:   Nutrition order, GI, labs, MEDS    Coordinated nutrition care:   J-tube replaced 8/31. TF started last night at 15 mL/hr, 4 hours later advanced to 30 mL/hr, ~4 hours later patient reported abdominal pain and nausea and requested that TF be stopped. Patient reports pain subsided once TF stopped and requests a trial of easier digested formula as discussed with RD 8/29.     Interventions:  1. Enteral Nutrition   - recommend trial an elemental based formula in setting of patient report of poor GI tolerance to multiple standard EN formulas in the past and reported abdominal pain/nausea with TF advancement this morning using Compleat Pediatric  - new EN goal: Vivonex RTF, continuous, 65 mL/hr (=1560 mL formula, 1560 kcal, 78 g protein, 1323 mL water daily)  - start at 15 mL/hr, advance as tolerated by 15 mL/hr every 4-6 hours to goal rate  - 40 mL water flush every 4 hours for hydration (=240 mL water per day)    2. Medications  - addition of bowel MEDS as needed for regularity (noted last BM was evening of 8/28, has not had nutrition intake since then)      Report Electronically Signed By: Wallis BambergStacey Leani Myron, RD, pager: [phone]

## 2017-04-30 LAB — CBC WITH DIFFERENTIAL
BASOPHILS % AUTO: 0.6 %
Basophils Abs Auto: 0 10*3/uL (ref 0.0–0.2)
EOSINOPHIL % AUTO: 6.4 %
EOSINOPHIL ABS AUTO: 0.5 10*3/uL (ref 0.0–0.5)
HEMATOCRIT: 34.2 % — AB (ref 36.0–46.0)
HEMOGLOBIN: 11.7 g/dL — AB (ref 12.0–16.0)
LYMPHOCYTE ABS AUTO: 2.2 10*3/uL (ref 1.2–5.2)
LYMPHOCYTES % AUTO: 29.2 %
MCH: 31.8 pg (ref 27.0–33.0)
MCHC: 34.3 % (ref 32.0–36.0)
MCV: 92.6 UM3 (ref 80.0–100.0)
MONOCYTES % AUTO: 6.6 %
MONOCYTES ABS AUTO: 0.5 10*3/uL (ref 0.1–0.8)
MPV: 7.1 UM3 (ref 6.8–10.0)
NEUTROPHIL ABS AUTO: 4.3 10*3/uL (ref 1.8–8.0)
NEUTROPHILS % AUTO: 57.2 %
PLATELET COUNT: 275 10*3/uL (ref 130–400)
PLATELET ESTIMATE, SMEAR: ADEQUATE
RDW: 14.4 % (ref 0.0–14.7)
RED CELL COUNT: 3.69 10*6/uL — AB (ref 4.10–5.10)
WHITE BLOOD CELL COUNT: 7.4 10*3/uL (ref 4.5–13.0)

## 2017-04-30 LAB — RENAL FUNCTION PANEL
ALBUMIN: 3 g/dL — AB (ref 3.4–4.8)
CALCIUM: 8.8 mg/dL (ref 8.6–10.5)
CHLORIDE: 102 mmol/L (ref 95–110)
CREATININE BLOOD: 0.68 mg/dL (ref 0.44–1.27)
Carbon Dioxide Total: 27 mmol/L (ref 24–32)
GLUCOSE: 70 mg/dL (ref 70–99)
POTASSIUM: 3.8 mmol/L (ref 3.3–5.0)
Phosphorus (PO4): 3.8 mg/dL (ref 2.4–5.0)
SODIUM: 138 mmol/L (ref 135–145)
UREA NITROGEN, BLOOD (BUN): 3 mg/dL — AB (ref 8–22)

## 2017-04-30 LAB — MAGNESIUM (MG): MAGNESIUM (MG): 1.8 mg/dL (ref 1.5–2.6)

## 2017-04-30 MED ORDER — NACL 0.9% IV BOLUS - DURATION REQ
1000.0000 mL | Freq: Once | INTRAVENOUS | Status: AC
Start: 2017-04-30 — End: 2017-04-30
  Administered 2017-04-30: 1000 mL via INTRAVENOUS

## 2017-04-30 MED ORDER — SENNOSIDES 8.6 MG TABLET
2.0000 | ORAL_TABLET | Freq: Two times a day (BID) | ORAL | Status: DC
Start: 2017-04-30 — End: 2017-04-30

## 2017-04-30 MED ORDER — POLYETHYLENE GLYCOL 3350 17 GRAM ORAL POWDER PACKET
17.0000 g | Freq: Every day | ORAL | Status: DC
Start: 2017-04-30 — End: 2017-05-03
  Administered 2017-04-30 – 2017-05-03 (×4): 17 g via JEJUNOSTOMY
  Filled 2017-04-30 (×4): qty 1

## 2017-04-30 MED ORDER — SENNOSIDES 8.6 MG TABLET
2.0000 | ORAL_TABLET | Freq: Every day | ORAL | Status: DC
Start: 2017-04-30 — End: 2017-05-03
  Administered 2017-04-30 – 2017-05-02 (×3): 17.2 mg via ORAL
  Filled 2017-04-30 (×3): qty 2

## 2017-04-30 NOTE — Nurse Assessment (Signed)
ASSESSMENT NOTE    Note Started: 04/30/2017, 06:57     Initial assessment completed and recorded in EMR.  Report received from night shift nurse and orders reviewed. Plan of Care reviewed and updated and appropriate, discussed with patient.  Encouraged patient to utilize call light when assistance is needed.  Patient verbalized understanding and is able to demonstrate use of call light when assistance is needed.  Belongings and call light within reach.  Will continue to monitor.  Jerilee HohVanessa Gaege Sangalang, RN

## 2017-04-30 NOTE — Progress Notes (Addendum)
[facility]    Patient: Chloe Barron 11Yd [age]yr female Date of Admission: 04/23/2017   MRN: [id number] Length of Stay: . LOS: 7 days    Room: 12759/127591 Date of Service: 04/30/2017     24 EVENTS / SUBJECTIVE:  No episode of syncope or psuedoseizure. Started on new TF and tolerating low rate. Pt was up to 3045ml/hr but decreased back down to 4530ml/hr due to abd discomfort. Pt feels that she may have gastroparesis flare and wondering about ativan. Discussed about reglan but she rather continue with phenergan for nausea. No BM for past a couple of days and asking for bowel regimen. Also endorsed some swallowing difficulties- "it takes me long time to swallow." asking if Lt hand "suture" can be removed although no suture can be seen (just scab).    ROS: as above    OBJECTIVE:    Scheduled Medications:    Current Facility-Administered Medications:  Enoxaparin (LOVENOX) Injection 30 mg SUBCUTANEOUS Q24H   Erythromycin (ERY-TAB) Delayed Release Tablet 250 mg ORAL TID w/ meals   Estradiol patch REMOVAL 1 patch Transdermal Q7D AT 2100   Estradiol Weekly (CLIMARA) 0.1 mg/24 hr Patch 1 patch Transdermal Sat   FamoTIDine (PEPCID) Tablet 20 mg ORAL BID   Fluoxetine (PROZAC) 20 mg/5 mL Solution 60 mg ORAL QAM   Iohexol (OMNIPAQUE) 300 mg/mL Injection for Oral/Enteral Tube/Rectal Use 5 mL Oral/Enteral Tube/Rectal IMAGING X 1   LevETIRAcetam (KEPPRA) 100 mg/mL Solution 750 mg ORAL BID   Montelukast (SINGULAIR) Tablet 10 mg ORAL Daily Bedtime   Pregabalin (LYRICA) Capsule 75 mg ORAL TID   Valproate Sodium (DEPAKENE) 250 mg/5 mL Solution 250 mg ORAL BID     Continuous Medications:    D5 / 0.45% NaCl  Last Rate: 100 mL/hr at 04/30/17 0111     PRN Medications:  Acetaminophen (TYLENOL) Tablet 650 mg, ORAL, Q4H PRN  Carboxymethylcellulose (CELLUVISC) Ophthalmic Solution 1 drop, BOTH Eyes, Q1H PRN  DiphenhydrAMINE (BENADRYL) Injection 25 mg, IV, Q6H PRN  Hydrocodone 7.5 mg-Acetaminophen 325 mg/15 mL  (HYCET) Solution 10 mL, ORAL, Q4H PRN  Hydromorphone (DILAUDID) Tablet 2 mg, ORAL, Q4H PRN  Lidocaine (L-M-X4) 4 % Cream, TOPICAL-site required, PRN  Lidocaine (XYLOCAINE) 10 mg/mL (1%) Injection 0.1-2 mL, INTRADERMAL, PRN  Lidocaine (XYLOCAINE) 10 mg/mL (1%) Injection 0.5-20 mL, INFILTRATION, PRN  Lidocaine cream REMOVAL, TOPICAL-site required, PRN  Metoclopramide (REGLAN) Injection 10 mg, IV, PRN X 1  Ondansetron (ZOFRAN) Injection 4 mg, IV, Q8H PRN  Promethazine (PHENERGAN) Injection 12.5 mg, IV, Q6H PRN        Intake and Output  Last Two Completed Shifts  In: 760 [Oral:480; Irrigant:280]  Out: 2650 [Urine:2650]    Current Shift  In: 520 [Oral:480; Irrigant:40]  Out: 750 [Urine:750]    PHYSICAL EXAM  Gral: cachetic young female with sunken eyes  HEENT: EOMI, op clear  Neck: supple  Chest: CTAB, nl resp effort  CV: RRR, no m/r/g  Abd: soft, J tube site c/d/i, no sig tenderness to palpation, +BS  Ext: no edema, Lt hand- no visible suture seen  Neuro: AAOX3, non focal    CURRENT VITALS      MIN - MAX  BP: (!) 84/45      BP: (84-92)/(45-65)   Pulse: 80      Pulse Min: 73 Max: 84  Resp: 16      Resp Min: 16 Max: 18  SpO2: 98 %      SpO2 Min: 97 % Max:  98 %  Temp: 36.3 C (97.3 F)      Temp Min: 36.2 C (97.2 F) Max: 36.6 C (97.9 F)  Weight: 39.1 kg (86 lb 3.2 oz)  SpO2: 98 %  Flow (L/min): 10  Pulse: 80    Lines & Drains: none    LABS:    CBC  Recent labs for the past 72 hours     04/30/17 0646 04/29/17 0710 04/28/17 0404    WHITE BLOOD CELL COUNT 7.4 6.4 6.2    HEMOGLOBIN 11.7* 12.7 11.7*    HEMATOCRIT 34.2* 35.8* 33.9*    MCV 92.6 93.5 93.5    PLATELET COUNT 275 312 296      CHEMISTRY  Recent labs for the past 72 hours     04/30/17 0646 04/29/17 0710 04/28/17 0404    SODIUM 138 138 137    POTASSIUM 3.8 3.9 4.0    CHLORIDE 102 101 102    CARBON DIOXIDE TOTAL 27 28 27     CREATININE BLOOD 0.68 0.65 0.66    UREA NITROGEN, BLOOD (BUN) 3* 4* 6*    GLUCOSE 70 86 79    CALCIUM 8.8 9.2 8.9    PHOSPHORUS (PO4)  3.8 5.0 4.9    MAGNESIUM (MG) 1.8 1.9 1.9          No results found for this basename: INR:* in the last 72 hours     MICROBIOLOGY:  UA neg    IMAGING  CXR -   1. No acute cardiopulmonary disease.    NCHCT  No acute intracranial hemorrhage or mass effect.    J tube replacement 8/28  A new 12 F x 25 cm (cut to fit) J tube replaced the stoma measurement device that was placed at the jejunostomy tube.     OSH EEG (04/17/17)  IMPRESSION:This is an abnormal EEG due to the presence of   excessive fast activity as well as some underlying slowing.     ASSESSMENT AND PLAN     21yo female with MMP including hx of recurrent admissions with pseudoseizure, diamond-blackckfan anemia s/p BMT, chronic GVHD, SMA syndrome, gastroparesis, severe malnutrition s/p J tube placement 8/15, PTSD presenting with somnolence after pt was given high doses of benzo due to concern for possible seizure. Pt was initially admitted to MICU for observation and transferred to the floor on 8/28. Pt also with leaking J tube due to animal bite.     # Somnolence due to benzos, resolved  Pt had 24mg  of versed by EMS and 2mg  of Ativan in ED.  Somnolence has since resolved, per Neurology recommending against further benzodiazepines for seizure like activity as patient having pseudoseizures.      # Pseudoseizure. Pt with multiple hospitalizations and prior neuro eval with EEG. NCHCT neg  - pt with neg EEG in the past but pt is still on keppra and valproic acid  - neuro recommends against treating the pseudoseizure. Trial of NS eye drops if having convulsions.     #Syncope  #POTS  Patient with a history of POTS, she states that she is on salt tabs for the treatment of this, unable to take currently due to J tube malfunction.  Per patient she had a positive tilt table test in the past.  Additional fall 8/30 when working with PT, per PT no prodrome just sudden loss of consciousness.  EKG on 8/26 normal.  No events on telemetry x24 hours including during last  pseudoseizure episode, echo unremarkable.  Will also  provide compression stockings for mechanical support.     -Compression stockings ordered through orthotics  - IVF 1L bolus for borderline BP ("i need IVF. My usual BP is 110s/60s")  -PT eval    # Leukocytosis, resolved  # Diarrhea. abd exam is benign. resolved  WBC resolved and is WNL without any intervention. Afebrile. Neg UA/CXR.  C diff canceled as this is unlikely.     # Gastroparesis  - cont erythromycin  - need to discuss about d/cing IV phenegan and trying po. Cannot use reglan while on phenergan    # constipation  - resume bowel regimen    # severe protein calorie malnutrition. unintentional weight loss over past 1 year and she's being evaluated at [facility]    # J tube leak due to animal bite (chewed by [place] pig). It was exchanged by IR on 8/28, clogged, now exchange 8/31 under general anesthesia.  restart tube feeds, however can not tolerate the formula.  plan DC patient if able to tolerate TF goal of 55.    - trial of J tube tolerance with new feed  - nutrition consult   - outpt f/u regarding weight loss      # Depression. Stable  - cont prozac    # Asthma. No e/o exacerbation  - cont singulair    # FEN/?dysphagia: SLP eval requested. mechanical diet as tolerated. J tube as tolerated    # Difficulty with access  - unable to place pIV even with [place]. Midline placed    # DVT Prophylaxis: lovenox    # Code Status: FULL    # Dispo: pt lives in [place] with her boyfriend's family but would like to go to [facility]. She moved from [place] about 3 wks ago. Her family is in [place].    Thurmon Fair, MD  Hospitalist Service  [facility]   Pager # [phone]  PI # [id number]

## 2017-04-30 NOTE — Plan of Care (Signed)
Problem: Patient Care Overview (Adult)  Goal: Plan of Care Review  Outcome: Ongoing (interventions implemented as appropriate)    Goal: Individualization and Mutuality  Outcome: Ongoing (interventions implemented as appropriate)    Goal: Discharge Needs Assessment  Outcome: Ongoing (interventions implemented as appropriate)  Goal Outcome Evaluation Note    Chloe Barron 11Yd is a 89yr female admitted 04/23/2017    OUTCOME SUMMARY AND PLAN MOVING FORWARD:   Pt calm and cooperative with care.  JT remains patent.  Pt wasn't tolerating TF rate change.  Rate decreased. Will attempt to increase at a later time.  Will continue to monitor.    Problem: Sleep Pattern Disturbance (Adult)  Goal: Adequate Sleep/Rest  Patient will demonstrate the desired outcomes by discharge/transition of care.   Outcome: Ongoing (interventions implemented as appropriate)      Problem: Pressure Ulcer Risk (Braden Scale) (Adult,Obstetrics,Pediatric)  Goal: Skin Integrity  Patient will demonstrate the desired outcomes by discharge/transition of care.   Outcome: Ongoing (interventions implemented as appropriate)      Problem: Pain, Acute (Adult)  Goal: Acceptable Pain Control/Comfort Level  Patient will demonstrate the desired outcomes by discharge/transition of care.   Outcome: Ongoing (interventions implemented as appropriate)      Problem: Pressure Ulcer Risk (Braden Scale) (Adult,Obstetrics,Pediatric)  Goal: Skin Integrity  Patient will demonstrate the desired outcomes by discharge/transition of care.   Outcome: Ongoing (interventions implemented as appropriate)      Problem: Fall Risk (Adult)  Goal: Identify Related Risk Factors and Signs and Symptoms  Related risk factors and signs and symptoms are identified upon initiation of Human Response Clinical Practice Guideline (CPG)    Outcome: Ongoing (interventions implemented as appropriate)    Goal: Absence of Falls  Patient will demonstrate the desired outcomes by discharge/transition of care.     Outcome: Ongoing (interventions implemented as appropriate)      Problem: Nutrition, Enteral (Adult)  Goal: Signs and Symptoms of Listed Potential Problems Will be Absent or Manageable (Nutrition, Enteral)  Signs and symptoms of listed potential problems will be absent or manageable by discharge/transition of care (reference Nutrition, Enteral (Adult) CPG).   Outcome: Ongoing (interventions implemented as appropriate)

## 2017-04-30 NOTE — Allied Health Consult (Signed)
PM&R   Speech Pathology Clinical Bedside Swallow Evaluation 04/30/2017    Name: Chloe Barron 21Yd  MR#: [id number]  DOB: 07/09/1996  Date Of Admit: 04/23/2017  Onset of new medical condition:04/23/2017   Date of initial therapy: 04/30/2017    Treatment Time: 60 minute evaluation - includes chart review, evaluation, interpretation, and report  Patient cleared by RN for evaluation     [facility]: [facility]  However, patient c/o globus sensation when eating dry solids (corn) and pills.  Additionally, patient reports she has to swallow numerous times before food goes down.  No overt s/s of penetration/aspiration w/ regular texture and thin liquids.  Recommend patient continue on regular texture and thin liquids using strict GER precautions and consult w/ GI if symptoms persist.                            Diet & Fluid Recommendations: Regular w/ thin liquids                        Aspiration precautions:             sit upright with head in neutral  position                                                                                                           remain sitting for 30 minutes following the meal                                                                                                           eat slowly and cautiously                                                                                                           small sips and bites  HOB at or above 30 degrees at all times                                                                                                           strict GER precautions                        Medications: in applesauce or puree    Recommendations:                    GI consult                                                                                      Consult ST PRN or if change in swallow function      History(from EMR):   21 year old female with known seizure and psychiatric disorder. She additionally has a history of gastroparesis and SMA syndrome diagnosed at [facility]. She underwent a laparoscopic J-tube placement on August 15. The procedure was uncomplicated and the patient was doing relatively well at home. 3 days ago while playing with her hamster, the animal bite out of the proximal portion of the J-tube. Any time since then tube feeds or medications have been administered a leak from the bite site. 2/3 of her T fasteners have fallen off. The patient also notes mild tenderness when medications or pushed through the J-tube. She receives continuous tube feeds and denies any pain associated with her continuous tube feeds. Denies any f/c/ns or any complaints. Only eats "2-3 bites" of PO every day, for comfort.    Reason for evaluation: To assess tolerance for PO intake    FINDINGS:  Respiratory Status:  WFL - NAD  Tracheostomy: absent  Cough: WFL  Secretion management: WFL  Current Diet Level: NPO   History of Aspiration: CXR- 04/23/17 - No focal consolidation or pulmonary edema. No pleural effusion or pneumothorax.; diet prior to admit- through J tube, but reports eating regular texture for pleasure from time to time      Lab Results   Lab Name Value Date/Time    BUN 3 (L) 04/30/2017 06:46 AM    CR 0.68 04/30/2017 06:46 AM     Lab Results   Lab Name Value Date/Time    ALB 3.0 (L) 04/30/2017 06:46 AM     Lab Results   Lab Name Value Date/Time    WBC 7.4 04/30/2017 06:46 AM     Lab Results   Lab Name Value Date/Time    RBC 3.69 (L) 04/30/2017 06:46 AM    HGB 11.7 (L) 04/30/2017 06:46 AM    HCT 34.2 (L) 04/30/2017 06:46 AM       Oral Motor:  Overall condition of oral cavity: Pink and moist  V:                  - face and mandible intact to light touch                       - rotary mandibular movement   VII: - face symmetric to smile  IX, X:            - soft palate midline and rises  symmetrically  - no dysarthria or hypophonia  XI:                 - adequate head control   XII:                - tongue normal bulk and range of motion, protrudes symmetrically    Lips: able to retrieve bolus from  cup, spoon and straw without anterior spillage  Buccal Control: able to use straw and no pocketing noted  Teeth: adequate for mastication  Tongue ROM/Strength: with symmetrical movement noted and WFL ROM/mobility and strength   Mandible: within functional limits with rotary movement   Velopharyngeal: symmetrical w/ elevation noted   Nasality: none  Laryngeal/Phonation: +phonation, +laryngeal excursion  Vocal Quality: clear  Pitch: +changes                  Loudness: +changes                                                               Apraxia/Dysarthria: none noted  Intelligibility: 100% at conversational level    LEVEL OF PARTICIPATION:  Cognitive Function: eyes open spontaneously, cooperative, following simple directives and answering egocentric questions  Ability to feed self: feeding self    Functional Swallow:  Oral Phase: bolus manipulation WFL, A-P transit prompt  Pharyngeal Phase: laryngeal excursion complete, c/o dry food sticking and suspect 2' to hx of gastroparesis and SMA syndrome     Trials included: thin liquids and intact solids   Compensatory Strategies Attempted:  n/a    Modalities/Procedures to be Utilized: Skilled ST intervention to include PO trials, tracheal auscultation, objective measures PRN, graded tasks, patient/family education.    Patient Education:     Method of Teaching: demonstration    Learner: patient    Response: verbalizes understanding      Patient/Caregiver Participation:      Has the plan of care been explained to the patient/caregiver? yes      Is the patient able to understand the plan of care: yes      Does the patient/caregiver(s) agree with the plan of care: yes      Barriers to Learning:      Are there barriers to learning? no.  If Yes,  what? none      Motivated to learn? yes      Best learning method: verbal    Prognosis: Good for goals    Patient/Family participation and agreement with recommendations: verbalized agreement  RN/MD notified of results: Yes     Report by:  Marinda Elk, M.S., CCC-SLP  Physical Medicine and Rehabilitation  Speech Pathologist  PI# [phone]  [phone]

## 2017-04-30 NOTE — Plan of Care (Signed)
Problem: Patient Care Overview (Adult)  Goal: Plan of Care Review  Outcome: Ongoing (interventions implemented as appropriate)    Goal: Discharge Needs Assessment  Outcome: Ongoing (interventions implemented as appropriate)      Problem: Sleep Pattern Disturbance (Adult)  Goal: Adequate Sleep/Rest  Patient will demonstrate the desired outcomes by discharge/transition of care.   Outcome: Ongoing (interventions implemented as appropriate)      Problem: Pressure Ulcer Risk (Braden Scale) (Adult,Obstetrics,Pediatric)  Goal: Skin Integrity  Patient will demonstrate the desired outcomes by discharge/transition of care.   Outcome: Ongoing (interventions implemented as appropriate)      Problem: Pain, Acute (Adult)  Goal: Acceptable Pain Control/Comfort Level  Patient will demonstrate the desired outcomes by discharge/transition of care.   Outcome: Ongoing (interventions implemented as appropriate)      Problem: Pressure Ulcer Risk (Braden Scale) (Adult,Obstetrics,Pediatric)  Goal: Skin Integrity  Patient will demonstrate the desired outcomes by discharge/transition of care.   Outcome: Ongoing (interventions implemented as appropriate)      Problem: Fall Risk (Adult)  Goal: Identify Related Risk Factors and Signs and Symptoms  Related risk factors and signs and symptoms are identified upon initiation of Human Response Clinical Practice Guideline (CPG)    Outcome: Ongoing (interventions implemented as appropriate)    Goal: Absence of Falls  Patient will demonstrate the desired outcomes by discharge/transition of care.    Outcome: Ongoing (interventions implemented as appropriate)      Problem: Nutrition, Enteral (Adult)  Goal: Signs and Symptoms of Listed Potential Problems Will be Absent or Manageable (Nutrition, Enteral)  Signs and symptoms of listed potential problems will be absent or manageable by discharge/transition of care (reference Nutrition, Enteral (Adult) CPG).   Outcome: Ongoing (interventions implemented as  appropriate)

## 2017-04-30 NOTE — Nurse Assessment (Signed)
ASSESSMENT NOTE    Note Started: 04/30/2017, 19:36     Initial assessment completed and recorded in EMR.  Report received from day shift nurse and orders reviewed. Plan of Care reviewed and appropriate, discussed with patient.  Daphene CalamityGrace Charletta Voight,  RN

## 2017-05-01 LAB — RENAL FUNCTION PANEL
ALBUMIN: 2.9 g/dL — AB (ref 3.4–4.8)
CALCIUM: 8.8 mg/dL (ref 8.6–10.5)
CARBON DIOXIDE TOTAL: 29 mmol/L (ref 24–32)
CHLORIDE: 102 mmol/L (ref 95–110)
CREATININE BLOOD: 0.58 mg/dL (ref 0.44–1.27)
GLUCOSE: 86 mg/dL (ref 70–99)
PHOSPHORUS (PO4): 4 mg/dL (ref 2.4–5.0)
POTASSIUM: 3.7 mmol/L (ref 3.3–5.0)
Sodium: 137 mmol/L (ref 135–145)
UREA NITROGEN, BLOOD (BUN): 8 mg/dL (ref 8–22)

## 2017-05-01 LAB — MAGNESIUM (MG): Magnesium (Mg): 1.6 mg/dL (ref 1.5–2.6)

## 2017-05-01 MED ORDER — MAGNESIUM SULFATE 1 GRAM/100 ML IN DEXTROSE 5 % INTRAVENOUS PIGGYBACK
1.0000 g | INJECTION | Freq: Once | INTRAVENOUS | Status: AC
Start: 2017-05-01 — End: 2017-05-01
  Administered 2017-05-01: 1 g via INTRAVENOUS
  Filled 2017-05-01: qty 100

## 2017-05-01 MED ORDER — POTASSIUM CHLORIDE 20 MEQ/15 ML ORAL LIQUID
30.0000 meq | Freq: Once | ORAL | Status: AC
Start: 2017-05-01 — End: 2017-05-01
  Administered 2017-05-01: 30 meq via JEJUNOSTOMY
  Filled 2017-05-01: qty 30

## 2017-05-01 NOTE — Plan of Care (Signed)
Problem: Patient Care Overview (Adult)  Goal: Plan of Care Review  Outcome: Ongoing (interventions implemented as appropriate)  Goal Outcome Evaluation Note    Chloe Barron 11Yd is a 57yrfemale admitted 04/23/2017    OUTCOME SUMMARY AND PLAN MOVING FORWARD:   Pt's  SBP on 80's asymptomatic. pt refused rate increase of tube feed to goal rate of 676mhr stated it makes her nauseous, currently running @ 4580mMD made aware.    Goal: Individualization and Mutuality  Outcome: Ongoing (interventions implemented as appropriate)      Problem: Sleep Pattern Disturbance (Adult)  Goal: Adequate Sleep/Rest  Patient will demonstrate the desired outcomes by discharge/transition of care.   Outcome: Ongoing (interventions implemented as appropriate)      Problem: Pressure Ulcer Risk (Braden Scale) (Adult,Obstetrics,Pediatric)  Goal: Skin Integrity  Patient will demonstrate the desired outcomes by discharge/transition of care.   Outcome: Ongoing (interventions implemented as appropriate)      Problem: Pain, Acute (Adult)  Goal: Acceptable Pain Control/Comfort Level  Patient will demonstrate the desired outcomes by discharge/transition of care.   Outcome: Ongoing (interventions implemented as appropriate)      Problem: Pressure Ulcer Risk (Braden Scale) (Adult,Obstetrics,Pediatric)  Goal: Skin Integrity  Patient will demonstrate the desired outcomes by discharge/transition of care.   Outcome: Ongoing (interventions implemented as appropriate)      Problem: Fall Risk (Adult)  Goal: Identify Related Risk Factors and Signs and Symptoms  Related risk factors and signs and symptoms are identified upon initiation of Human Response Clinical Practice Guideline (CPG)    Outcome: Outcome(s) achieved Date Met: 05/01/17    Goal: Absence of Falls  Patient will demonstrate the desired outcomes by discharge/transition of care.    Outcome: Ongoing (interventions implemented as appropriate)      Problem: Nutrition, Enteral (Adult)  Goal: Signs and  Symptoms of Listed Potential Problems Will be Absent or Manageable (Nutrition, Enteral)  Signs and symptoms of listed potential problems will be absent or manageable by discharge/transition of care (reference Nutrition, Enteral (Adult) CPG).   Outcome: Ongoing (interventions implemented as appropriate)

## 2017-05-01 NOTE — Plan of Care (Signed)
Problem: Patient Care Overview (Adult)  Goal: Plan of Care Review  Outcome: Ongoing (interventions implemented as appropriate)    Goal: Individualization and Mutuality  Outcome: Ongoing (interventions implemented as appropriate)    Goal: Discharge Needs Assessment  Outcome: Ongoing (interventions implemented as appropriate)      Problem: Sleep Pattern Disturbance (Adult)  Goal: Adequate Sleep/Rest  Patient will demonstrate the desired outcomes by discharge/transition of care.   Outcome: Ongoing (interventions implemented as appropriate)      Problem: Pain, Acute (Adult)  Goal: Acceptable Pain Control/Comfort Level  Patient will demonstrate the desired outcomes by discharge/transition of care.   Outcome: Ongoing (interventions implemented as appropriate)      Problem: Pressure Ulcer Risk (Braden Scale) (Adult,Obstetrics,Pediatric)  Goal: Skin Integrity  Patient will demonstrate the desired outcomes by discharge/transition of care.   Outcome: Ongoing (interventions implemented as appropriate)      Problem: Fall Risk (Adult)  Goal: Absence of Falls  Patient will demonstrate the desired outcomes by discharge/transition of care.   Outcome: Ongoing (interventions implemented as appropriate)      Problem: Nutrition, Enteral (Adult)  Goal: Signs and Symptoms of Listed Potential Problems Will be Absent or Manageable (Nutrition, Enteral)  Signs and symptoms of listed potential problems will be absent or manageable by discharge/transition of care (reference Nutrition, Enteral (Adult) CPG).   Outcome: Ongoing (interventions implemented as appropriate)

## 2017-05-01 NOTE — Nurse Assessment (Signed)
ASSESSMENT NOTE    Note Started: 05/01/2017, 19:21     Report received from day shift nurse and orders reviewed. Pt asleep during shift report rounds, respiration unlabored.  Fanny BienLynette Icelynn Onken, RN RN

## 2017-05-01 NOTE — Nurse Assessment (Signed)
ASSESSMENT NOTE    Note Started: 05/01/2017, 11:51     Initial assessment completed at 0800 and recorded in EMR.  Report received from night shift nurse and orders reviewed. Plan of Care reviewed and appropriate, discussed with patient.  Elesa HackerMaria Teresa C Odus Clasby,  RN

## 2017-05-01 NOTE — Progress Notes (Signed)
[facility] HOSPITALIST - INTERNAL MEDICINE DAILY PROGRESS NOTE    Patient: Chloe Barron 11Yd 79yr female Date of Admission: 04/23/2017   MRN: [id number] Length of Stay: . LOS: 8 days    Room: 12759/127591 Date of Service: 05/01/2017     24 EVENTS / SUBJECTIVE:  No episode of syncope or psuedoseizure. Tolerating TF up to 45cc/hr. Had BM. No n/v. Discussed with pt about possible d/c tomorrow and f/u with her outpt providers including GI    ROS: as above    OBJECTIVE:    Scheduled Medications:    Current Facility-Administered Medications:  Enoxaparin (LOVENOX) Injection 30 mg SUBCUTANEOUS Q24H   Erythromycin (ERY-TAB) Delayed Release Tablet 250 mg ORAL TID w/ meals   Estradiol patch REMOVAL 1 patch Transdermal Q7D AT 2100   Estradiol Weekly (CLIMARA) 0.1 mg/24 hr Patch 1 patch Transdermal Sat   FamoTIDine (PEPCID) Tablet 20 mg ORAL BID   Fluoxetine (PROZAC) 20 mg/5 mL Solution 60 mg ORAL QAM   Iohexol (OMNIPAQUE) 300 mg/mL Injection for Oral/Enteral Tube/Rectal Use 5 mL Oral/Enteral Tube/Rectal IMAGING X 1   LevETIRAcetam (KEPPRA) 100 mg/mL Solution 750 mg ORAL BID   Magnesium Sulfate 1 gram in D5W 100 mL IVPB IV ONCE   Montelukast (SINGULAIR) Tablet 10 mg ORAL Daily Bedtime   Polyethylene Glycol 3350 (MIRALAX) Oral Powder Packet 17 g J-TUBE QAM   Pregabalin (LYRICA) Capsule 75 mg ORAL TID   Sennosides (SENOKOT) Tablet 17.2 mg ORAL Daily Bedtime   Valproate Sodium (DEPAKENE) 250 mg/5 mL Solution 250 mg ORAL BID     Continuous Medications:    D5 / 0.45% NaCl  Last Rate: 100 mL/hr at 05/01/17 0646     PRN Medications:  Acetaminophen (TYLENOL) Tablet 650 mg, ORAL, Q4H PRN  Carboxymethylcellulose (CELLUVISC) Ophthalmic Solution 1 drop, BOTH Eyes, Q1H PRN  DiphenhydrAMINE (BENADRYL) Injection 25 mg, IV, Q6H PRN  Hydrocodone 7.5 mg-Acetaminophen 325 mg/15 mL (HYCET) Solution 10 mL, ORAL, Q4H PRN  Hydromorphone (DILAUDID) Tablet 2 mg, ORAL, Q4H PRN  Lidocaine (L-M-X4) 4 % Cream, TOPICAL-site required, PRN  Lidocaine (XYLOCAINE) 10 mg/mL  (1%) Injection 0.1-2 mL, INTRADERMAL, PRN  Lidocaine (XYLOCAINE) 10 mg/mL (1%) Injection 0.5-20 mL, INFILTRATION, PRN  Lidocaine cream REMOVAL, TOPICAL-site required, PRN  Metoclopramide (REGLAN) Injection 10 mg, IV, PRN X 1  Ondansetron (ZOFRAN) Injection 4 mg, IV, Q8H PRN  Promethazine (PHENERGAN) Injection 12.5 mg, IV, Q6H PRN        Intake and Output  Last Two Completed Shifts  In: 1400 [Oral:600; Crystalloid:450; Irrigant:350]  Out: 4500 [Urine:4500]    Current Shift       PHYSICAL EXAM  Gral: cachetic young female with sunken eyes  HEENT: EOMI, op clear  Neck: supple  Chest: CTAB, nl resp effort  CV: RRR, no m/r/g  Abd: soft, J tube site c/d/i, no sig tenderness to palpation, +BS  Ext: no edema, Lt hand- no visible suture seen  Neuro: AAOX3, non focal    CURRENT VITALS      MIN - MAX  BP: 96/58      BP: (85-96)/(49-58)   Pulse: 74      Pulse Min: 74 Max: 85  Resp: 20      Resp Min: 16 Max: 20  SpO2: 99 %      SpO2 Min: 98 % Max: 100 %  Temp: 36.3 C (97.3 F)      Temp Min: 36.1 C (97 F) Max: 36.4 C (97.5 F)  Weight: 39.1 kg (86 lb 3.2 oz)  SpO2: 99 %  Pulse: 74    Lines & Drains: none    LABS:    CBC  Recent labs for the past 72 hours     04/30/17 0646 04/29/17 0710    WHITE BLOOD CELL COUNT 7.4 6.4    HEMOGLOBIN 11.7* 12.7    HEMATOCRIT 34.2* 35.8*    MCV 92.6 93.5    PLATELET COUNT 275 312      CHEMISTRY  Recent labs for the past 72 hours     05/01/17 0609 04/30/17 0646 04/29/17 0710    SODIUM 137 138 138    POTASSIUM 3.7 3.8 3.9    CHLORIDE 102 102 101    CARBON DIOXIDE TOTAL 29 27 28     CREATININE BLOOD 0.58 0.68 0.65    UREA NITROGEN, BLOOD (BUN) 8 3* 4*    GLUCOSE 86 70 86    CALCIUM 8.8 8.8 9.2    PHOSPHORUS (PO4) 4.0 3.8 5.0    MAGNESIUM (MG) 1.6 1.8 1.9          No results found for this basename: INR:* in the last 72 hours     MICROBIOLOGY:  UA neg    IMAGING  CXR -   1. No acute cardiopulmonary disease.    NCHCT  No acute intracranial hemorrhage or mass effect.    J tube replacement  8/28  A new 12 F x 25 cm (cut to fit) J tube replaced the stoma measurement device that was placed at the jejunostomy tube.     OSH EEG (04/17/17)  IMPRESSION:This is an abnormal EEG due to the presence of   excessive fast activity as well as some underlying slowing.     ASSESSMENT AND PLAN     21yo female with MMP including hx of recurrent admissions with pseudoseizure, diamond-blackckfan anemia s/p BMT, chronic GVHD, SMA syndrome, gastroparesis, severe malnutrition s/p J tube placement 8/15, PTSD presenting with somnolence after pt was given high doses of benzo due to concern for possible seizure. Pt was initially admitted to MICU for observation and transferred to the floor on 8/28. Pt also with leaking J tube due to animal bite.     # Somnolence due to benzos, resolved  Pt had 24mg  of versed by EMS and 2mg  of Ativan in ED.  Somnolence has since resolved, per Neurology recommending against further benzodiazepines for seizure like activity as patient having pseudoseizures.      # Pseudoseizure. Pt with multiple hospitalizations and prior neuro eval with EEG. NCHCT neg  - pt with neg EEG in the past but pt is still on keppra and valproic acid  - neuro recommends against treating the pseudoseizure. Trial of NS eye drops if having convulsions.     #Syncope  #POTS  Patient with a history of POTS, she states that she is on salt tabs for the treatment of this, unable to take currently due to J tube malfunction.  Per patient she had a positive tilt table test in the past.  Additional fall 8/30 when working with PT, per PT no prodrome just sudden loss of consciousness.  EKG on 8/26 normal.  No events on telemetry x24 hours including during last pseudoseizure episode, echo unremarkable.  Will also provide compression stockings for mechanical support.     -Compression stockings ordered through orthotics  -PT eval    # Leukocytosis, resolved  # Diarrhea. abd exam is benign. resolved  WBC resolved and is WNL without any  intervention. Afebrile. Neg UA/CXR.  C diff canceled as this is unlikely.     # Gastroparesis  - cont erythromycin  - need to discuss about d/cing IV phenegan and trying po. Cannot use reglan while on phenergan    # constipation  - resume bowel regimen    # severe protein calorie malnutrition. unintentional weight loss over past 1 year and she's being evaluated at [facility]    # J tube leak due to animal bite (chewed by [place] pig). It was exchanged by IR on 8/28, clogged, now exchange 8/31 under general anesthesia.  restart tube feeds, however can not tolerate the formula.   - trial of J tube tolerance with new feed. Pt able to tolerate TF at 45cc/hr  - nutrition consult   - outpt f/u regarding weight loss  - outpt GI eval for EGD (if not done recently at her local hospital)    # Depression. Stable  - cont prozac    # Asthma. No e/o exacerbation  - cont singulair    # FEN/?dysphagia: SLP eval requested- no aspiration. outpt GI evaluation with EGD recommended. mechanical diet as tolerated. J tube as tolerated    # Difficulty with access  - unable to place pIV even with [place]. Midline placed    # DVT Prophylaxis: lovenox    # Code Status: FULL    # Dispo: pt lives in [place] with her boyfriend's family but would [facility] to [facility] to [facility]. She moved from [place] about 3 wks ago. Her family is in [place]. Possible d/c tomorrow 9/3- unclear if pt already has home health. wknd d/c manager working on it    Thurmon Fair, MD  Hospitalist Service  [facility]   Pager # [phone]  PI # [id number]

## 2017-05-01 NOTE — Nurse Focus (Signed)
Tube feeding current rate 6545ml/hr, pt refused to increase tube feeding rate to goal rate of 65ml stated it make her nauseous, MD made aware.

## 2017-05-01 NOTE — Nurse Focus (Signed)
At 1700 pt called that lap site below JT is bleeding. Lap incision is opened small with one suture sticking out. Pt stated I was messing with it". Incision cleaned with NS and applied with steri strip. Dr. Selena BattenKim notified. Fabio PierceMaria Teresa Dushawn Pusey,RN

## 2017-05-02 LAB — RENAL FUNCTION PANEL
ALBUMIN: 2.8 g/dL — AB (ref 3.4–4.8)
CALCIUM: 8.7 mg/dL (ref 8.6–10.5)
CARBON DIOXIDE TOTAL: 22 mmol/L — AB (ref 24–32)
CHLORIDE: 104 mmol/L (ref 95–110)
CREATININE BLOOD: 0.6 mg/dL (ref 0.44–1.27)
GLUCOSE: 116 mg/dL — AB (ref 70–99)
PHOSPHORUS (PO4): 3.9 mg/dL (ref 2.4–5.0)
POTASSIUM: 4.2 mmol/L (ref 3.3–5.0)
Sodium: 136 mmol/L (ref 135–145)
UREA NITROGEN, BLOOD (BUN): 8 mg/dL (ref 8–22)

## 2017-05-02 LAB — MAGNESIUM (MG): MAGNESIUM (MG): 1.9 mg/dL (ref 1.5–2.6)

## 2017-05-02 NOTE — Progress Notes (Signed)
[name] HOSPITALIST - INTERNAL MEDICINE DAILY PROGRESS NOTE    Patient: Chloe Barron 89Yd 4yr female Date of Admission: 04/23/2017   MRN: [id number] Length of Stay: . LOS: 9 days    Room: 12759/127591 Date of Service: 05/02/2017     24 EVENTS / SUBJECTIVE:  No episode of syncope or psuedoseizure. Tolerating TF up to 45cc/hr. Had BM. No n/v. Discussed with pt about possible d/c tomorrow and f/u with her outpt providers including GI    ROS: as above    OBJECTIVE:    Scheduled Medications:    Current Facility-Administered Medications:  Enoxaparin (LOVENOX) Injection 30 mg SUBCUTANEOUS Q24H   Erythromycin (ERY-TAB) Delayed Release Tablet 250 mg ORAL TID w/ meals   Estradiol patch REMOVAL 1 patch Transdermal Q7D AT 2100   Estradiol Weekly (CLIMARA) 0.1 mg/24 hr Patch 1 patch Transdermal Sat   FamoTIDine (PEPCID) Tablet 20 mg ORAL BID   Fluoxetine (PROZAC) 20 mg/5 mL Solution 60 mg ORAL QAM   Iohexol (OMNIPAQUE) 300 mg/mL Injection for Oral/Enteral Tube/Rectal Use 5 mL Oral/Enteral Tube/Rectal IMAGING X 1   LevETIRAcetam (KEPPRA) 100 mg/mL Solution 750 mg ORAL BID   Montelukast (SINGULAIR) Tablet 10 mg ORAL Daily Bedtime   Polyethylene Glycol 3350 (MIRALAX) Oral Powder Packet 17 g J-TUBE QAM   Pregabalin (LYRICA) Capsule 75 mg ORAL TID   Sennosides (SENOKOT) Tablet 17.2 mg ORAL Daily Bedtime   Valproate Sodium (DEPAKENE) 250 mg/5 mL Solution 250 mg ORAL BID     Continuous Medications:    D5 / 0.45% NaCl  Last Rate: 100 mL/hr at 05/02/17 0500     PRN Medications:  Acetaminophen (TYLENOL) Tablet 650 mg, ORAL, Q4H PRN  Carboxymethylcellulose (CELLUVISC) Ophthalmic Solution 1 drop, BOTH Eyes, Q1H PRN  DiphenhydrAMINE (BENADRYL) Injection 25 mg, IV, Q6H PRN  Hydrocodone 7.5 mg-Acetaminophen 325 mg/15 mL (HYCET) Solution 10 mL, ORAL, Q4H PRN  Hydromorphone (DILAUDID) Tablet 2 mg, ORAL, Q4H PRN  Lidocaine (L-M-X4) 4 % Cream, TOPICAL-site required, PRN  Lidocaine (XYLOCAINE) 10 mg/mL (1%) Injection 0.1-2 mL, INTRADERMAL, PRN  Lidocaine  (XYLOCAINE) 10 mg/mL (1%) Injection 0.5-20 mL, INFILTRATION, PRN  Lidocaine cream REMOVAL, TOPICAL-site required, PRN  Metoclopramide (REGLAN) Injection 10 mg, IV, PRN X 1  Ondansetron (ZOFRAN) Injection 4 mg, IV, Q8H PRN  Promethazine (PHENERGAN) Injection 12.5 mg, IV, Q6H PRN        Intake and Output  Last Two Completed Shifts  In: 5751.7 [Oral:900; Enteral:990; Crystalloid:3681.7; Irrigant:180]  Out: 4250 [Urine:4250]    Current Shift       PHYSICAL EXAM  Gral: cachetic young female with sunken eyes  HEENT: EOMI, op clear  Neck: supple  Chest: CTAB, nl resp effort  CV: RRR, no m/r/g  Abd: soft, J tube site c/d/i, no sig tenderness to palpation, +BS. A small suture site is healing. No infection   Ext: no edema, Lt hand- no visible suture seen  Neuro: AAOX3, non focal    CURRENT VITALS      MIN - MAX  BP: 99/63      BP: (90-99)/(56-63)   Pulse: 77      Pulse Min: 70 Max: 77  Resp: 20      Resp Min: 16 Max: 20  SpO2: 99 %      SpO2 Min: 97 % Max: 100 %  Temp: 36.5 C (97.7 F)      Temp Min: 36.4 C (97.5 F) Max: 36.8 C (98.3 F)  Weight: 39.1 kg (86 lb 3.2 oz)  SpO2: 99 %  Pulse: 77    Lines & Drains: none    LABS:    CBC  Recent labs for the past 72 hours     04/30/17 0646    WHITE BLOOD CELL COUNT 7.4    HEMOGLOBIN 11.7*    HEMATOCRIT 34.2*    MCV 92.6    PLATELET COUNT 275      CHEMISTRY  Recent labs for the past 72 hours     05/02/17 0501 05/01/17 0609 04/30/17 0646    SODIUM 136 137 138    POTASSIUM 4.2 3.7 3.8    CHLORIDE 104 102 102    CARBON DIOXIDE TOTAL 22* 29 27    CREATININE BLOOD 0.60 0.58 0.68    UREA NITROGEN, BLOOD (BUN) 8 8 3*    GLUCOSE 116* 86 70    CALCIUM 8.7 8.8 8.8    PHOSPHORUS (PO4) 3.9 4.0 3.8    MAGNESIUM (MG) 1.9 1.6 1.8          No results found for this basename: INR:* in the last 72 hours     MICROBIOLOGY:  UA neg    IMAGING  CXR -   1. No acute cardiopulmonary disease.    NCHCT  No acute intracranial hemorrhage or mass effect.    J tube replacement 8/28  A new 12 F x 25  cm (cut to fit) J tube replaced the stoma measurement device that was placed at the jejunostomy tube.     OSH EEG (04/17/17)  IMPRESSION:This is an abnormal EEG due to the presence of   excessive fast activity as well as some underlying slowing.     ASSESSMENT AND PLAN     21yo female with MMP including hx of recurrent admissions with pseudoseizure, diamond-blackckfan anemia s/p BMT, chronic GVHD, SMA syndrome, gastroparesis, severe malnutrition s/p J tube placement 8/15, PTSD presenting with somnolence after pt was given high doses of benzo due to concern for possible seizure. Pt was initially admitted to MICU for observation and transferred to the floor on 8/28. Pt also with leaking J tube due to animal bite.     # Somnolence due to benzos, resolved  Pt had 24mg  of versed by EMS and 2mg  of Ativan in ED.  Somnolence has since resolved, per Neurology recommending against further benzodiazepines for seizure like activity as patient having pseudoseizures.      # Pseudoseizure. Pt with multiple hospitalizations and prior neuro eval with EEG. NCHCT neg  - pt with neg EEG in the past but pt is still on keppra and valproic acid  - neuro recommends against treating the pseudoseizure. Trial of NS eye drops if having convulsions.     #Syncope  #POTS  Patient with a history of POTS, she states that she is on salt tabs for the treatment of this, unable to take currently due to J tube malfunction.  Per patient she had a positive tilt table test in the past.  Additional fall 8/30 when working with PT, per PT no prodrome just sudden loss of consciousness.  EKG on 8/26 normal.  No events on telemetry x24 hours including during last pseudoseizure episode, echo unremarkable.  Will also provide compression stockings for mechanical support.     -Compression stockings ordered through orthotics  -PT eval    # Leukocytosis, resolved  # Diarrhea. abd exam is benign. resolved  WBC resolved and is WNL without any intervention. Afebrile.  Neg UA/CXR.  C diff canceled as this is unlikely.     #  Gastroparesis  - cont erythromycin  - need to discuss about d/cing IV phenegan and trying po. Cannot use reglan while on phenergan    # constipation  - resume bowel regimen    # severe protein calorie malnutrition. unintentional weight loss over past 1 year and she's being evaluated at [facility]    # J tube leak due to animal bite (chewed by [place] pig). It was exchanged by IR on 8/28, clogged, now exchange 8/31 under general anesthesia.  restart tube feeds, however can not tolerate the formula.   - trial of J tube tolerance with new feed. Pt able to tolerate TF at 45cc/hr  - nutrition consult   - outpt f/u regarding weight loss  - outpt GI eval for EGD (if not done recently at her local hospital)  Tube feeding:  - new EN goal: Vivonex RTF, continuous, 65 mL/hr (=1560 mL formula, 1560 kcal, 78 g protein, 1323 mL water daily)  - start at 15 mL/hr, advance as tolerated by 15 mL/hr every 4-6 hours to goal rate  - 40 mL water flush every 4 hours for hydration (=240 mL water per day)      # Depression. Stable  - cont prozac    # Asthma. No e/o exacerbation  - cont singulair    # FEN/?dysphagia: SLP eval requested- no aspiration. outpt GI evaluation with EGD recommended. mechanical diet as tolerated. J tube as tolerated    # Difficulty with access  - unable to place pIV even with [place]. Midline placed    # DVT Prophylaxis: lovenox    # Code Status: FULL    # Dispo: pt lives in [place] with her boyfriend's family but would like to go to [facility]. She moved from [place] about 3 wks ago. Her family is in [place].   Will advance TF to goal today and home tomorrow    Report Electronically Signed by: Loleta Books, MD Attending  PI: [phone]  Pager: [phone]

## 2017-05-02 NOTE — Nurse Assessment (Signed)
ASSESSMENT NOTE    Note Started: 05/02/2017, 11:55     Initial assessment completed at 0800 and recorded in EMR.  Report received from night shift nurse and orders reviewed. Plan of Care reviewed and appropriate, discussed with patient.  Elesa HackerMaria Teresa C Contrina Orona,  RN

## 2017-05-02 NOTE — Plan of Care (Signed)
Problem: Patient Care Overview (Adult)  Goal: Plan of Care Review  Outcome: Ongoing (interventions implemented as appropriate)    Goal: Individualization and Mutuality  Outcome: Ongoing (interventions implemented as appropriate)    Goal: Discharge Needs Assessment  Outcome: Ongoing (interventions implemented as appropriate)      Problem: Sleep Pattern Disturbance (Adult)  Goal: Adequate Sleep/Rest  Patient will demonstrate the desired outcomes by discharge/transition of care.   Outcome: Ongoing (interventions implemented as appropriate)      Problem: Pain, Acute (Adult)  Goal: Acceptable Pain Control/Comfort Level  Patient will demonstrate the desired outcomes by discharge/transition of care.   Outcome: Ongoing (interventions implemented as appropriate)      Problem: Pressure Ulcer Risk (Braden Scale) (Adult,Obstetrics,Pediatric)  Goal: Skin Integrity  Patient will demonstrate the desired outcomes by discharge/transition of care.   Outcome: Ongoing (interventions implemented as appropriate)      Problem: Fall Risk (Adult)  Goal: Absence of Falls  Patient will demonstrate the desired outcomes by discharge/transition of care.   Outcome: Ongoing (interventions implemented as appropriate)      Problem: Nutrition, Enteral (Adult)  Goal: Signs and Symptoms of Listed Potential Problems Will be Absent or Manageable (Nutrition, Enteral)  Signs and symptoms of listed potential problems will be absent or manageable by discharge/transition of care (reference Nutrition, Enteral (Adult) CPG).   Outcome: Ongoing (interventions implemented as appropriate)

## 2017-05-02 NOTE — Plan of Care (Signed)
Problem: Patient Care Overview (Adult)  Goal: Plan of Care Review  Outcome: Ongoing (interventions implemented as appropriate)  Goal Outcome Evaluation Note    Chloe Barron 11Yd is a 89yr female admitted 04/23/2017    OUTCOME SUMMARY AND PLAN MOVING FORWARD:   Pt c/o nausea twice but had no emesis. Pain controlled with current PRN oral pain medication. Tolerated Tube feeding at 45 ml/hr. Pt was able to give liquid medication via her J tube, reported being comfortable doing it for over a year now. Slept well. VS stable. No seizure activity noted. Possible DC home today.  Goal: Individualization and Mutuality  Outcome: Ongoing (interventions implemented as appropriate)    Goal: Discharge Needs Assessment  Outcome: Ongoing (interventions implemented as appropriate)      Problem: Sleep Pattern Disturbance (Adult)  Goal: Adequate Sleep/Rest  Patient will demonstrate the desired outcomes by discharge/transition of care.   Outcome: Ongoing (interventions implemented as appropriate)      Problem: Pressure Ulcer Risk (Braden Scale) (Adult,Obstetrics,Pediatric)  Goal: Skin Integrity  Patient will demonstrate the desired outcomes by discharge/transition of care.   Outcome: Ongoing (interventions implemented as appropriate)      Problem: Pain, Acute (Adult)  Goal: Acceptable Pain Control/Comfort Level  Patient will demonstrate the desired outcomes by discharge/transition of care.   Outcome: Ongoing (interventions implemented as appropriate)      Problem: Pressure Ulcer Risk (Braden Scale) (Adult,Obstetrics,Pediatric)  Goal: Skin Integrity  Patient will demonstrate the desired outcomes by discharge/transition of care.   Outcome: Ongoing (interventions implemented as appropriate)      Problem: Fall Risk (Adult)  Goal: Absence of Falls  Patient will demonstrate the desired outcomes by discharge/transition of care.    Outcome: Ongoing (interventions implemented as appropriate)      Problem: Nutrition, Enteral (Adult)  Goal: Signs and  Symptoms of Listed Potential Problems Will be Absent or Manageable (Nutrition, Enteral)  Signs and symptoms of listed potential problems will be absent or manageable by discharge/transition of care (reference Nutrition, Enteral (Adult) CPG).   Outcome: Ongoing (interventions implemented as appropriate)

## 2017-05-02 NOTE — Nurse Assessment (Signed)
ASSESSMENT NOTE    Note Started: 05/02/2017, 19:57     Initial assessment completed and recorded in EMR.  Report received from day shift nurse and orders reviewed. Plan of Care reviewed and updated, discussed with patient.  Fanny BienLynette Ruchi Stoney,  RN

## 2017-05-03 ENCOUNTER — Other Ambulatory Visit: Payer: Self-pay

## 2017-05-03 LAB — RENAL FUNCTION PANEL
ALBUMIN: 3 g/dL — AB (ref 3.4–4.8)
CALCIUM: 8.8 mg/dL (ref 8.6–10.5)
CHLORIDE: 99 mmol/L (ref 95–110)
CREATININE BLOOD: 0.53 mg/dL (ref 0.44–1.27)
Carbon Dioxide Total: 28 mmol/L (ref 24–32)
Glucose: 83 mg/dL (ref 70–99)
PHOSPHORUS (PO4): 4 mg/dL (ref 2.4–5.0)
POTASSIUM: 3.9 mmol/L (ref 3.3–5.0)
SODIUM: 134 mmol/L — AB (ref 135–145)
UREA NITROGEN, BLOOD (BUN): 8 mg/dL (ref 8–22)

## 2017-05-03 LAB — MAGNESIUM (MG): MAGNESIUM (MG): 1.7 mg/dL (ref 1.5–2.6)

## 2017-05-03 LAB — ELECTROCARDIOGRAM WITH RHYTHM STRIP: QTC: 455

## 2017-05-03 MED ORDER — PREGABALIN 75 MG CAPSULE
75.0000 mg | ORAL_CAPSULE | Freq: Three times a day (TID) | ORAL | 0 refills | Status: DC
Start: 2017-05-03 — End: 2017-05-03
  Filled 2017-05-03: qty 90, 30d supply, fill #0

## 2017-05-03 MED ORDER — FAMOTIDINE 20 MG TABLET
20.0000 mg | ORAL_TABLET | Freq: Two times a day (BID) | ORAL | 0 refills | Status: AC
Start: 2017-05-03 — End: 2018-04-28
  Filled 2017-05-03: qty 60, 30d supply, fill #0

## 2017-05-03 MED ORDER — PROMETHAZINE 6.25 MG/5 ML ORAL SYRUP
12.5000 mg | ORAL_SOLUTION | ORAL | 0 refills | Status: AC | PRN
Start: 2017-05-03 — End: 2017-05-13
  Filled 2017-05-03: qty 120, 2d supply, fill #0

## 2017-05-03 MED ORDER — MONTELUKAST 10 MG TABLET
10.0000 mg | ORAL_TABLET | Freq: Every day | ORAL | 0 refills | Status: DC
Start: 2017-05-03 — End: 2017-05-07
  Filled 2017-05-03: qty 30, 30d supply, fill #0

## 2017-05-03 MED ORDER — LEVETIRACETAM 100 MG/ML ORAL SOLUTION
750.0000 mg | Freq: Two times a day (BID) | ORAL | 0 refills | Status: AC
Start: 2017-05-03 — End: 2017-05-17
  Filled 2017-05-03: qty 210, 14d supply, fill #0

## 2017-05-03 MED ORDER — ESTRADIOL 0.1 MG/24 HR WEEKLY TRANSDERMAL PATCH
1.0000 | MEDICATED_PATCH | TRANSDERMAL | 0 refills | Status: DC
Start: 2017-05-06 — End: 2017-05-31
  Filled 2017-05-03: qty 4, 28d supply, fill #0

## 2017-05-03 MED ORDER — VALPROIC ACID (AS SODIUM SALT) 250 MG/5 ML ORAL SOLUTION
250.0000 mg | Freq: Two times a day (BID) | ORAL | 0 refills | Status: DC
Start: 2017-05-03 — End: 2017-05-07
  Filled 2017-05-03: qty 140, 14d supply, fill #0

## 2017-05-03 MED ORDER — FLUOXETINE 20 MG/5 ML (4 MG/ML) ORAL SOLUTION
60.0000 mg | Freq: Every day | ORAL | 0 refills | Status: DC
Start: 2017-05-04 — End: 2017-05-07
  Filled 2017-05-03: qty 450, 30d supply, fill #0

## 2017-05-03 MED ORDER — HYDROMORPHONE 2 MG TABLET
2.0000 mg | ORAL_TABLET | ORAL | 0 refills | Status: AC | PRN
Start: 2017-05-03 — End: 2017-05-13
  Filled 2017-05-03: qty 40, 7d supply, fill #0

## 2017-05-03 NOTE — Allied Health Progress (Addendum)
ADULT NUTRITION RE-ASSESSMENT    Admission Date: 04/23/2017   Date of Service: 05/03/2017, 12:50     Nutrition Assessment     Admission Summary:   [age]yr old female with history of diamond-blackfan anemia s/p BMT, chronic GVHD, seizure disorder, psychiatric disorder, gastroparesis and SMA syndrome diagnosed at [facility] s/p laparoscopic J-tube placement (04/12/17). Admitted for leaky J-tube s/p hamster bite to proximal portion of the J-tube. S/p J tube replacement with IR (7/28), but appears clogged today    Interval History:   8/31 - clogged J tube exchanged by IR. TFs (Compeat Pediatric) restarted with slow advancement, abdominal pain and nausea reported so TFs stopped per patient request  9/1 - TF formula changed to elemental formula (Vivonex), slowly titrated up  9/4 - TFs of Vivonex reached goal of 65 ml/hr    Patient endorses good tolerance to Vivionex TF formula. Per discussion with case manager, patient's home TF care has been re-established with [facility] and new formula/supplies have been delivered to her house.    Nutrition Focused Physical Exam: completed 04/26/17  Summary of findings: mild-moderate fat depletion; mild-moderate muscle depletion    Additional nutrition focused physical findings:  Potential signs of inflammation: chronic disease (diamond-blackfan anemia s/p BMT, chronic GVHD, gastroparesis, SMA syndrome)  Digestive Systems:     Last BM: 9/4 (x1, moderate)   GI s/sx: abdominal discomfort, intermittent nausea   Tubes: jejunostomy tube to LUQ (replaced by IR 8/31)  Skin: no open wounds noted    Anthropometrics:   Height: 152.4 cm (5')  Admission Weight: 39.1 kg (8/26, bed scale) no edema  Current Weight: no new weights to trend  Weight Change: -23.6 kg (40%) weight loss over ~2 years per patient report; -4.5 kg (10%) weight loss over ~10 days (likely related to dehydration/diarrhea) but ~stable from 2 months ago    Pertinent Labs:   Reviewed    Pertinent Medications:   Erythromycin (ERY-TAB)  Delayed Release Tablet 250 mg, ORAL, TID w/ meals  FamoTIDine (PEPCID) Tablet 20 mg, ORAL, BID  Polyethylene Glycol 3350 (MIRALAX) Oral Powder Packet 17 g, J-TUBE, QAM  Sennosides (SENOKOT) Tablet 17.2 mg, ORAL, Daily Bedtime    PRN Medications:  Ondansetron (ZOFRAN) Injection 4 mg, IV, Q8H PRN  Promethazine (PHENERGAN) Injection 12.5 mg, IV, Q6H PRN    Nutrition Order:    PO: Regular Diet  EN: Vivonex RTF @ 65 ml/hr + 40 mL water flush every 4 hours    Estimated Nutrition Needs:  [employer] (1088) x x 1.2-1.3 + 250 kcal/day for weight gain (using 39.1 kg) = 1550-1665 kcal/day  1.3-1.5 g protein/kg (using 39.1 kg) = 50-60 g protein/day  35-40 mL fluid/kg (using 39.1 kg) = 1370-1565 mL fluid/day    Estimated Nutrition Intake: 8/29-9/4  EN: 243 mL Vivonex RTF (243 kcals, 12 g protein)  PO: average 45% of small meals (ordering meals like grapes and corn only) (estimate ~300 kcals, 10 g protein/day    Nutrition Diagnosis     Moderate (non-severe) malnutrition in the context of chronic illness (more than 3 months) with mild to moderate chronic inflammation related to chronic disease (diamond-blackfan anemia s/p BMT, chronic GVHD, gastroparesis, SMA syndrome) as evidenced by mild loss of muscle and subcutaneous fat mass based upon physical exam.   - Status: New - may meet criteria for severe, but unable to quantify energy intake over past 1 month and weight history overall stable in last 2-3 months    Altered GI function related to gastroparesis/SMA  syndrome as evidenced by reliance on EN via J tube to meet majority of nutrition needs.  - Status: New    Nutrition Intervention (Recommendations)     1) Enteral nutrition   - Continue TFs as ordered:  - Vivonex RTF, continuous, 65 mL/hr (=1560 mL formula, 1560 kcal, 78 g protein, 1323 mL water daily)  - 40 mL water flush every 4 hours for hydration (=240 mL water per day)    Nutrition Monitoring & Evaluation (Goals)     Total energy intake: 1400+ kcal/day  Total protein  intake: 45+ g of protein/day  Weight: 0.5-1 lb weight gain weekly toward IBW  Digestive system: soft/formed BM every 1-2 days        Report Electronically Signed By: Caryl Never, RD  Pager: [phone]    I have reviewed the details of this nutrition assessment and agree with the current nutrition plan of care.  Elenora Gamma, RD, CNSC

## 2017-05-03 NOTE — Plan of Care (Signed)
Problem: Patient Care Overview (Adult)  Goal: Plan of Care Review  Outcome: Ongoing (interventions implemented as appropriate)  Goal Outcome Evaluation Note    Chloe Barron 11Yd is a [age]yr female admitted 04/23/2017    OUTCOME SUMMARY AND PLAN MOVING FORWARD:   Pain and nausea controlled with current PRN medications. No seizure activity noted. Independent with giving her medications via her J tube. Tolerated TF at 65 ml/hr. VS stable. Possible DC home today.    Goal: Individualization and Mutuality  Outcome: Ongoing (interventions implemented as appropriate)    Goal: Discharge Needs Assessment  Outcome: Ongoing (interventions implemented as appropriate)      Problem: Sleep Pattern Disturbance (Adult)  Goal: Adequate Sleep/Rest  Patient will demonstrate the desired outcomes by discharge/transition of care.   Outcome: Ongoing (interventions implemented as appropriate)      Problem: Pressure Ulcer Risk (Braden Scale) (Adult,Obstetrics,Pediatric)  Goal: Skin Integrity  Patient will demonstrate the desired outcomes by discharge/transition of care.   Outcome: Ongoing (interventions implemented as appropriate)      Problem: Pain, Acute (Adult)  Goal: Acceptable Pain Control/Comfort Level  Patient will demonstrate the desired outcomes by discharge/transition of care.   Outcome: Ongoing (interventions implemented as appropriate)      Problem: Pressure Ulcer Risk (Braden Scale) (Adult,Obstetrics,Pediatric)  Goal: Skin Integrity  Patient will demonstrate the desired outcomes by discharge/transition of care.   Outcome: Ongoing (interventions implemented as appropriate)      Problem: Fall Risk (Adult)  Goal: Absence of Falls  Patient will demonstrate the desired outcomes by discharge/transition of care.    Outcome: Ongoing (interventions implemented as appropriate)      Problem: Nutrition, Enteral (Adult)  Goal: Signs and Symptoms of Listed Potential Problems Will be Absent or Manageable (Nutrition, Enteral)  Signs and symptoms of  listed potential problems will be absent or manageable by discharge/transition of care (reference Nutrition, Enteral (Adult) CPG).   Outcome: Ongoing (interventions implemented as appropriate)

## 2017-05-03 NOTE — Nurse Discharge Note (Signed)
Discussed with the patient all discharge instructions. Patient verbalized understanding and all questions fully answered. Advised to call MD or go to the nearest ED if any problems arise. Patient verbalized understanding.  All belongings accounted for and with patient.  All lines d/c'd prior to discharge.  Patient's condition remains stable for discharge.  Awaiting for patient transport to escort pt via wheelchair to main pavilion lobby.

## 2017-05-03 NOTE — Nurse Assessment (Signed)
ASSESSMENT NOTE    Note Started: 05/03/2017, 06:48     Initial assessment completed and recorded in EMR.  Report received from night shift nurse and orders reviewed. Plan of Care reviewed and updated and appropriate, discussed with patient.  Encouraged patient to utilize call light when assistance is needed.  Patient verbalized understanding and is able to demonstrate use of call light when assistance is needed.  TF remains at goal and pt tolerating well.  Belongings and call light within reach.  Will continue to monitor.  Jerilee HohVanessa Herschel Fleagle, RN

## 2017-05-03 NOTE — Discharge Summary (Signed)
INTERNAL MEDICINE DISCHARGE SUMMARY  Date of Admission:   04/23/2017  5:07 PM   Date of Discharge: 05/03/2017     Admitting Service: MICU Discharging Service: hospital medicine faculty service    Attending Physician at time of Discharge: Dr. Rober Minion       Discharge diagnoses:  Somnolence due to benzos, resolved  Pseudoseizure  Syncope  POTS  Leukocytosis, resolved  Diarrhea  Gastroparesis  Constipation  severe protein calorie malnutrition  J tube leak due to animal bite   Depression  Asthma    Discharge medications:   Kercheval 11Yd, [place]   Home Medication Instructions HAR:[id number]    Printed on:05/03/17 1027   Medication Information                      Estradiol Weekly (CLIMARA) 0.1 mg/24 hr Patch  Apply 1 patch to the skin every Saturday.             FamoTIDine (PEPCID) 20 mg Tablet  Take 1 tablet by mouth 2 times daily.             Fluoxetine (PROZAC) 20 mg/5 mL Liquid  Take 15 mL by mouth every morning.             Hydromorphone (DILAUDID) 2 mg Tablet  Take 1 tablet by mouth every 4 hours if needed.             LevETIRAcetam (KEPPRA) 100 mg/mL Solution  Take 7.5 mL by mouth 2 times daily.             Montelukast (SINGULAIR) 10 mg Tablet  Take 1 tablet by mouth every day at bedtime. take in the evening             Pregabalin (LYRICA) 75 mg capsule  Take 1 capsule by mouth 3 times daily.             Promethazine (PHENERGAN) 6.25 mg/5 mL Liquid  Take 10 mL by mouth every 4 hours if needed for nausea/vomiting.             Valproate Sodium (DEPAKENE) 250 mg/5 mL Liquid  Take 5 mL by mouth 2 times daily.                Procedure(s) Performed: J-tube replacement, midline placement and removal     Consultation(s): IR    Reasons for admission (Brief HPI):   21yo female with MMP including hx of recurrent admissions with pseudoseizure, diamond-blackckfan anemia s/p BMT, chronic GVHD, SMA syndrome, gastroparesis, severe malnutrition s/p J tube placement 8/15, PTSD presenting with somnolence after pt was given high doses of benzo  due to concern for possible seizure. Pt was initially admitted to MICU for observation and transferred to the floor on 8/28. Pt also with leaking J tube due to animal bite.     Hospital Course:   # Somnolence due to benzos, resolved  Pt had 24mg  of versed by EMS and 2mg  of Ativan in ED.  Somnolence has since resolved, per Neurology recommending against further benzodiazepines for seizure like activity as patient having pseudoseizures.      # Pseudoseizure. Pt with multiple hospitalizations and prior neuro eval with EEG. NCHCT neg  - pt with neg EEG in the past but pt is still on keppra and valproic acid, will defer this to her primary MD  - neuro recommends against treating the pseudoseizure. Trial of NS eye drops if having convulsions.     #Syncope  #POTS  Patient with a history of POTS, she states that she is on salt tabs for the treatment of this, unable to take currently due to J tube malfunction.  Per patient she had a positive tilt table test in the past.  Additional fall 8/30 when working with PT, per PT no prodrome just sudden loss of consciousness.  EKG on 8/26 normal.  No events on telemetry x24 hours including during last pseudoseizure episode, echo unremarkable.  Will also provide compression stockings for mechanical support.     -Compression stockings ordered through orthotics  -PT evaluation completed    # Leukocytosis, resolved  # Diarrhea. abd exam is benign. resolved  WBC resolved and is WNL without any intervention. Afebrile. Neg UA/CXR.      # Gastroparesis  Much improved. Now tolerated TF at goal    # constipation  - resume bowel regimen    # severe protein calorie malnutrition. unintentional weight loss over past 1 year and she's being evaluated at [facility]    # J tube leak due to animal bite (chewed by [place] pig). It was exchanged by IR on 8/28, clogged, now exchange 8/31 under general anesthesia.  restart tube feeds and she is tolerating it at goal  - nutrition consult   - outpt f/u  regarding weight loss  - outpt GI eval for EGD (if not done recently at her local hospital)  Tube feeding:  - new EN goal: Vivonex RTF, continuous, 65 mL/hr (=1560 mL formula, 1560 kcal, 78 g protein, 1323 mL water daily)  - start at 15 mL/hr, advance as tolerated by 15 mL/hr every 4-6 hours to goal rate  - 40 mL water flush every 4 hours for hydration (=240 mL water per day)    # Depression. Stable  - cont prozac    # Asthma. No e/o exacerbation  - cont singulair    # FEN/?dysphagia: SLP eval requested- no aspiration. outpt GI evaluation with EGD recommended. mechanical diet as tolerated. Tube feeding as above    # Code Status: FULL    Complication(s): none    Pertinent Lab, Study, and Image Findings:   I personally reviewed the following   BLOOD COUNTS   No results found for this basename: WBC:*,RBC:*,HGB:*,HCT:*,MCV:*,MCH:*,RDW:*,MPV:*,PCT:*,PLT:*,PLTESTME:*,NRBC:*,BCCOM:* in the last 72 hours     BASIC METABOLIC PANEL Recent labs for the past 72 hours     05/03/17 0519 05/02/17 0501 05/01/17 0609    GLUCOSE 83 116* 86    UREA NITROGEN, BLOOD (BUN) 8 8 8     CREATININE BLOOD 0.53 0.60 0.58    SODIUM 134* 136 137    POTASSIUM 3.9 4.2 3.7    CHLORIDE 99 104 102    CARBON DIOXIDE TOTAL 28 22* 29    CALCIUM 8.8 8.7 8.8     MICROBIOLOGY:  UA neg    IMAGING  CXR -   1. No acute cardiopulmonary disease.    NCHCT  No acute intracranial hemorrhage or mass effect.    J tube replacement 8/28  A new 12 F x 25 cm (cut to fit) J tube replaced the stoma measurement device that was placed at the jejunostomy tube.     [facility] EEG (04/17/17)  IMPRESSION:This is an abnormal EEG due to the presence of   excessive fast activity as well as some underlying slowing.        Brief pertinent discharge exam  Temp: 36.6 C (97.9 F) (09/05 0708)  Temp src: Oral (09/05 0708)  Pulse: 68 (  09/05 0708)  BP: 91/56 (09/05 0708)  Resp: 18 (09/05 0708)  SpO2: 98 % (09/05 0708)  Height: 152.4 cm (5') (08/26 2130)  Weight: 39.1 kg (86 lb 3.2  oz) (08/26 2130)      Intake and Output    Physical Exam:  Gral: cachetic young female with sunken eyes, alert, oriented x3  HEENT: EOMI, op clear  Neck: supple  Chest: CTAB, nl resp effort  CV: RRR, no m/r/g  Abd: soft, J tube site c/d/i, no sig tenderness to palpation, +BS. A small suture site is healing. No infection   Ext: no edema, Lt hand- no visible suture seen  Neuro: AAOX3, non focal    Condition at discharge: stable     Discharge disposition: home    Discharge instructions:   Mechanic soft diet. Tube feeding as above  Activity as tolerated.  Medications as above    Medical follow-up plan:  Follow with PCP ASAP  Please follow with neurology to address antiseizure medications.      Pending studies at discharge:  None    Time spent for discharge: >30 minutes    Report Electronically Signed by:  Loleta Booksu Ya Carr Shartzer, MD   Attending  PI [phone]

## 2017-05-03 NOTE — Discharge Instructions (Signed)
Please follow with neurology to address antiseizure medications.

## 2017-05-04 NOTE — MD Query (Addendum)
MEDICAL RECORD INPATIENT CODING UNIT  CLINICAL DOCUMENTATION IMPROVEMENT REQUEST    Patient Name:   Chloe Barron 11Yd  Acct:     MRN: [id number]  Admit Date: 04/23/2017   Physician: Dr. Rober Minionai,       Additional documentation/clarification is needed to ensure that the severity of illness, risk of mortality, and DRG are accurate for your patient's encounter.  The following clinical information needs clarification.  Please see my question(s) below.  To respond, addend and sign this query.  Your addendum and signature will be filed as a permanent note in the patient's legal medical record.    Q:  The progress notes list, "Severe protein calorie malnutrition" as a diagnosis.     RD note describes," Moderate protein calorie malnutrition".  Would you please clarify whether severe protein calorie malnutrition or moderate protein calorie malnutrition should be included as a diagnosis on this admission?    Thank you,    Mir Mirsarraf  Clinical Documentation Specialist  Health Information Management (HIM) Division  Value Department  [phone]    A: Moderate protein calorie malnutrition      Report Electronically Signed by: Loleta Booksu Ya Curly Mackowski, MD Attending  P: [phone]  Pager: [phone]

## 2017-05-07 ENCOUNTER — Encounter: Payer: Self-pay | Admitting: Emergency Medicine

## 2017-05-07 ENCOUNTER — Inpatient Hospital Stay
Admission: EM | Admit: 2017-05-07 | Discharge: 2017-05-10 | DRG: 393 | Disposition: A | Discharge status: Home / Self Care | Payer: Medicaid Other | Attending: HOSPITALIST | Admitting: HOSPITALIST

## 2017-05-07 ENCOUNTER — Emergency Department
Admission: EM | Admit: 2017-05-07 | Discharge: 2017-05-07 | Disposition: A | Discharge status: Home / Self Care | Attending: Emergency Medicine | Admitting: Emergency Medicine

## 2017-05-07 DIAGNOSIS — K3184 Gastroparesis: Secondary | ICD-10-CM | POA: Diagnosis present

## 2017-05-07 DIAGNOSIS — Z659 Problem related to unspecified psychosocial circumstances: Secondary | ICD-10-CM | POA: Diagnosis present

## 2017-05-07 DIAGNOSIS — D89811 Chronic graft-versus-host disease: Secondary | ICD-10-CM | POA: Diagnosis present

## 2017-05-07 DIAGNOSIS — Z880 Allergy status to penicillin: Secondary | ICD-10-CM

## 2017-05-07 DIAGNOSIS — F4325 Adjustment disorder with mixed disturbance of emotions and conduct: Secondary | ICD-10-CM

## 2017-05-07 DIAGNOSIS — R569 Unspecified convulsions: Secondary | ICD-10-CM | POA: Diagnosis present

## 2017-05-07 DIAGNOSIS — Z888 Allergy status to other drugs, medicaments and biological substances status: Secondary | ICD-10-CM

## 2017-05-07 DIAGNOSIS — R630 Anorexia: Secondary | ICD-10-CM | POA: Diagnosis present

## 2017-05-07 DIAGNOSIS — J45909 Unspecified asthma, uncomplicated: Secondary | ICD-10-CM | POA: Diagnosis present

## 2017-05-07 DIAGNOSIS — Z91148 Patient's other noncompliance with medication regimen for other reason: Secondary | ICD-10-CM | POA: Diagnosis present

## 2017-05-07 DIAGNOSIS — Y847 Blood-sampling as the cause of abnormal reaction of the patient, or of later complication, without mention of misadventure at the time of the procedure: Secondary | ICD-10-CM | POA: Diagnosis present

## 2017-05-07 DIAGNOSIS — T8602 Bone marrow transplant failure: Secondary | ICD-10-CM | POA: Diagnosis present

## 2017-05-07 DIAGNOSIS — E43 Unspecified severe protein-calorie malnutrition: Secondary | ICD-10-CM | POA: Diagnosis present

## 2017-05-07 DIAGNOSIS — Z9689 Presence of other specified functional implants: Secondary | ICD-10-CM

## 2017-05-07 DIAGNOSIS — Z881 Allergy status to other antibiotic agents status: Secondary | ICD-10-CM

## 2017-05-07 DIAGNOSIS — I10 Essential (primary) hypertension: Secondary | ICD-10-CM

## 2017-05-07 DIAGNOSIS — F445 Conversion disorder with seizures or convulsions: Secondary | ICD-10-CM

## 2017-05-07 DIAGNOSIS — Z434 Encounter for attention to other artificial openings of digestive tract: Principal | ICD-10-CM | POA: Insufficient documentation

## 2017-05-07 DIAGNOSIS — G40409 Other generalized epilepsy and epileptic syndromes, not intractable, without status epilepticus: Principal | ICD-10-CM

## 2017-05-07 DIAGNOSIS — D6101 Constitutional (pure) red blood cell aplasia: Secondary | ICD-10-CM | POA: Diagnosis present

## 2017-05-07 DIAGNOSIS — F6810 Factitious disorder, unspecified: Secondary | ICD-10-CM | POA: Diagnosis present

## 2017-05-07 DIAGNOSIS — Z91018 Allergy to other foods: Secondary | ICD-10-CM

## 2017-05-07 DIAGNOSIS — F329 Major depressive disorder, single episode, unspecified: Secondary | ICD-10-CM | POA: Diagnosis present

## 2017-05-07 DIAGNOSIS — F439 Reaction to severe stress, unspecified: Secondary | ICD-10-CM

## 2017-05-07 DIAGNOSIS — Z91013 Allergy to seafood: Secondary | ICD-10-CM

## 2017-05-07 DIAGNOSIS — Z9114 Patient's other noncompliance with medication regimen: Secondary | ICD-10-CM | POA: Insufficient documentation

## 2017-05-07 LAB — URINALYSIS-COMPLETE
BILIRUBIN URINE: NEGATIVE
GLUCOSE URINE: NEGATIVE mg/dL
KETONES_MMC: NEGATIVE
LEUK. ESTERASE: NEGATIVE
NITRITE URINE: NEGATIVE
OCCULT BLOOD URINE_MMC: NEGATIVE
PH URINE: 6 (ref 5.0–9.0)
PROTEIN URINE: NEGATIVE mg/dL
SPECIFIC GRAVITY URINE MMC: 1.015 (ref 1.002–1.030)
SQUAMOUS EPI: 1
UROBILINOGEN.: NEGATIVE mg/dL
WBC: 1 /HPF (ref <5)

## 2017-05-07 LAB — CBC WITH DIFFERENTIAL
BASOPHILS % AUTO: 0.5 % (ref 0.0–1.0)
BASOPHILS % AUTO: 0.8 %
BASOPHILS ABS AUTO: 0 10*3/uL (ref 0.0–0.1)
BASOPHILS ABS AUTO: 0.1 10*3/uL (ref 0.0–0.2)
EOSINOPHIL % AUTO: 3 % (ref 0.0–4.0)
EOSINOPHIL % AUTO: 2.2 %
EOSINOPHIL ABS AUTO: 0.2 10*3/uL (ref 0.0–0.2)
EOSINOPHIL ABS AUTO: 0.2 10*3/uL (ref 0.0–0.5)
HEMATOCRIT: 34.5 % — AB (ref 36.0–48.0)
HEMATOCRIT: 33.7 % — AB (ref 36.0–46.0)
HEMOGLOBIN: 12 g/dL (ref 12.0–16.0)
HEMOGLOBIN: 11.8 g/dL — AB (ref 12.0–16.0)
IMMATURE GRANULOCYTES % AUTO: 0.3 % (ref 0.00–0.50)
IMMATURE GRANULOCYTES ABS AUTO: 0 10*3/uL (ref 0.0–0.0)
LYMPHOCYTE ABS AUTO: 1.6 10*3/uL (ref 1.3–2.9)
LYMPHOCYTE ABS AUTO: 1.7 10*3/uL (ref 1.2–5.2)
LYMPHOCYTES % AUTO: 25.7 % (ref 5.0–41.0)
LYMPHOCYTES % AUTO: 24.2 %
MCH: 32.3 pg (ref 27.0–34.0)
MCH: 32.6 pg (ref 27.0–33.0)
MCHC G/DL: 34.8 g/dL (ref 33.0–37.0)
MCHC: 34.9 % (ref 32.0–36.0)
MCV: 92.7 fL (ref 82.0–97.0)
MCV: 93.3 UM3 (ref 80.0–100.0)
MONOCYTES % AUTO: 6.3 % (ref 0.0–10.0)
MONOCYTES % AUTO: 5.7 %
MONOCYTES ABS AUTO: 0.4 10*3/uL (ref 0.3–0.8)
MPV: 9.3 fL — AB (ref 9.4–12.4)
MPV: 7.5 UM3 (ref 6.8–10.0)
Monocytes Abs Auto: 0.4 10*3/uL (ref 0.1–0.8)
NEUTROPHIL ABS AUTO: 3.89 10*3/uL (ref 2.20–4.80)
NEUTROPHIL ABS AUTO: 4.7 10*3/uL (ref 1.8–8.0)
NEUTROPHILS % AUTO: 64.2 % (ref 45.0–75.0)
NEUTROPHILS % AUTO: 67.1 %
NUCLEATED CELL COUNT: 0 10*3/uL (ref 0.0–0.1)
Nucleated RBC/100 WBC: 0 %{WBCs} (ref <=0.0)
PLATELET COUNT: 223 10*3/uL (ref 151–365)
PLATELET COUNT: 237 10*3/uL (ref 130–400)
RDW: 14.4 % (ref 11.5–14.5)
RDW: 15.6 % — AB (ref 0.0–14.7)
RED CELL COUNT: 3.72 10*6/uL — AB (ref 3.80–5.10)
RED CELL COUNT: 3.61 10*6/uL — AB (ref 4.10–5.10)
WHITE BLOOD CELL COUNT: 6.1 10*3/uL (ref 4.2–10.8)
WHITE BLOOD CELL COUNT: 7 10*3/uL (ref 4.5–13.0)

## 2017-05-07 LAB — HEPATIC FUNCTION PANEL
ALANINE TRANSFERASE (ALT): 17 U/L (ref 5–54)
ALBUMIN: 3.5 g/dL (ref 3.4–4.8)
ALKALINE PHOSPHATASE (ALP): 63 U/L (ref 30–130)
ASPARTATE TRANSAMINASE (AST): 22 U/L (ref 15–43)
BILIRUBIN DIRECT: 0.1 mg/dL (ref 0.0–0.2)
BILIRUBIN TOTAL: 0.4 mg/dL (ref 0.3–1.3)
PROTEIN: 6.3 g/dL (ref 6.3–8.3)

## 2017-05-07 LAB — COMPREHENSIVE METABOLIC PANEL
ALANINE TRANSFERASE (ALT): 17 U/L (ref 4–56)
ALB/GLOB RATIO_MMC: 1.2 (ref 1.0–1.6)
ALBUMIN: 3.7 g/dL (ref 3.2–4.7)
ALKALINE PHOSPHATASE (ALP): 65 U/L (ref 38–126)
ASPARTATE TRANSAMINASE (AST): 22 U/L (ref 9–44)
BILIRUBIN TOTAL: 0.5 mg/dL (ref 0.1–2.2)
BUN/CREATININE_MMC: 14.1 (ref 7.3–21.7)
CHLORIDE: 106 mmol/L (ref 99–109)
CREATININE BLOOD: 0.64 mg/dL (ref 0.50–1.30)
Calcium: 9.3 mg/dL (ref 8.7–10.2)
Carbon Dioxide Total: 24 mmol/L (ref 22–32)
E-GFR, NON-AFRICAN AMERICAN: 119 mL/min/{1.73_m2} (ref 60–?)
E-GFR_MMC: 119 mL/min/{1.73_m2} (ref 60–?)
GLOBULIN BLOOD_MMC: 3.1 g/dL (ref 2.2–4.2)
GLUCOSE: 95 mg/dL (ref 70–99)
POTASSIUM: 3.7 mmol/L (ref 3.5–5.2)
PROTEIN: 6.8 g/dL (ref 5.9–8.2)
SODIUM: 139 mmol/L (ref 134–143)
UREA NITROGEN, BLOOD (BUN): 9 mg/dL (ref 6–21)

## 2017-05-07 LAB — BASIC METABOLIC PANEL
CALCIUM: 8.9 mg/dL (ref 8.6–10.5)
CARBON DIOXIDE TOTAL: 22 mmol/L — AB (ref 24–32)
CHLORIDE: 104 mmol/L (ref 95–110)
CREATININE BLOOD: 0.59 mg/dL (ref 0.44–1.27)
GLUCOSE: 82 mg/dL (ref 70–99)
POTASSIUM: 3.6 mmol/L (ref 3.3–5.0)
SODIUM: 137 mmol/L (ref 135–145)
UREA NITROGEN, BLOOD (BUN): 5 mg/dL — AB (ref 8–22)

## 2017-05-07 LAB — ETHANOL, PLASMA: ETHANOL, PLASMA: NEGATIVE mg/dL

## 2017-05-07 LAB — VALPROATE: VALPROATE: 26 mg/L — AB (ref 50–100)

## 2017-05-07 LAB — AMMONIA: Ammonia: 47 umol/L — ABNORMAL HIGH (ref 2–30)

## 2017-05-07 LAB — LIPASE: LIPASE: 23 U/L (ref 13–51)

## 2017-05-07 MED ORDER — LEVETIRACETAM 500 MG TABLET
2000.0000 mg | ORAL_TABLET | Freq: Once | ORAL | Status: DC
Start: 2017-05-07 — End: 2017-05-07

## 2017-05-07 MED ORDER — ACETAMINOPHEN 650 MG/20.3 ML ORAL SOLUTION
15.0000 mg/kg | Freq: Once | ORAL | Status: AC
Start: 2017-05-07 — End: 2017-05-07
  Administered 2017-05-07: 615.1 mg via JEJUNOSTOMY
  Filled 2017-05-07: qty 20.3

## 2017-05-07 MED ORDER — LEVETIRACETAM 500 MG/5 ML (5 ML) ORAL SOLUTION
2000.0000 mg | Freq: Once | ORAL | Status: DC
Start: 2017-05-07 — End: 2017-05-07
  Filled 2017-05-07: qty 20

## 2017-05-07 MED ORDER — LEVETIRACETAM 1,000 MG/100 ML IN SODIUM CHLORIDE(ISO-OSM) IV PIGGYBACK
1000.0000 mg | INJECTION | Freq: Once | INTRAVENOUS | Status: DC
Start: 2017-05-07 — End: 2017-05-07
  Filled 2017-05-07: qty 100

## 2017-05-07 MED ORDER — LACTATED RINGERS IV BOLUS - DURATION REQ
1.0000 L | Freq: Once | INTRAVENOUS | Status: AC
Start: 2017-05-07 — End: 2017-05-08
  Administered 2017-05-07: 1000 mL via INTRAVENOUS

## 2017-05-07 MED ORDER — HYDROXYZINE HCL 25 MG TABLET
50.0000 mg | ORAL_TABLET | Freq: Once | ORAL | Status: AC
Start: 2017-05-07 — End: 2017-05-07
  Administered 2017-05-07: 50 mg via ORAL
  Filled 2017-05-07: qty 2

## 2017-05-07 MED ORDER — LORAZEPAM 2 MG/ML INJECTION SOLUTION
2.0000 mg | Freq: Once | INTRAMUSCULAR | Status: AC
Start: 2017-05-07 — End: 2017-05-07
  Administered 2017-05-07: 2 mg via INTRAVENOUS

## 2017-05-07 MED ORDER — LEVETIRACETAM 500 MG/5 ML (5 ML) ORAL SOLUTION
2000.0000 mg | Freq: Two times a day (BID) | ORAL | Status: DC
Start: 2017-05-07 — End: 2017-05-08
  Administered 2017-05-07: 2000 mg via ORAL

## 2017-05-07 MED ORDER — VALPROIC ACID (AS SODIUM SALT) 250 MG/5 ML (5 ML) ORAL SOLUTION
250.0000 mg | Freq: Once | ORAL | Status: AC
Start: 2017-05-07 — End: 2017-05-07
  Administered 2017-05-07: 250 mg via ORAL
  Filled 2017-05-07: qty 5

## 2017-05-07 MED ORDER — LORAZEPAM 2 MG/ML INJECTION SOLUTION
1.0000 mg | Freq: Once | INTRAMUSCULAR | Status: AC
  Administered 2017-05-07: 1 mg via INTRAVENOUS
  Filled 2017-05-07: qty 1

## 2017-05-07 NOTE — ED Nursing Note (Signed)
Lab at bedside for draw

## 2017-05-07 NOTE — Discharge Instructions (Signed)
Please take all of your medications exactly as directed.  Make sure that you are getting your valproic acid in.  Make sure that your levels are appropriate and if they are not please Dr. adjust her medications.  Return for new or worsening complaints.

## 2017-05-07 NOTE — Allied Health Progress (Signed)
True MRN: [id number]

## 2017-05-07 NOTE — ED Nursing Note (Signed)
Pt with fiancee at the bedside- seizure pads placed for pt safety

## 2017-05-07 NOTE — ED Nursing Note (Signed)
Pt placed on ETCo2, tracking room, awake, alert, will continue to monitor.

## 2017-05-07 NOTE — Allied Health Progress (Signed)
Met with patient per MD request for transportation assistance. Chloe Barron was encountered sleeping in hallway bed, wrapped in her own blanket. She lives with her boyfriend and his younger sister, and neither are able to come to the ED and pick her up tonight. She initially stated that she was "scared" to use a Lyft to return but when questioned about how she proposed to return to her home, she gave no alternative. She was advised that a Lyft would be ordered if she could not supply her own ride home. She did not object.

## 2017-05-07 NOTE — ED Nursing Note (Signed)
Pt noted w/ tremors to full-body/extremities,  Ativan to be given.

## 2017-05-07 NOTE — Consults (Addendum)
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     She was transferred from an OSH after presenting with seizure like activity for which she received Ativan at the OSH and then had another episode of seizure like activity and was given 109m ativan and 1gm, however following patient was awake and back to baseline and following commands. After chart review at this time I recommended no further medications be given to her for seizures given very well documented Hx of PNES including EEGs with [facility] in [facility] past.     According to the patient she was about to watch a movie when she had a " seizure" stating that she passed out of something and that she fell down the stairs and she heard a pop from her G-tube tube. She reports that she is very concerned about her G-tube at this time. When I asked her about her seizures she tells me that she has non-epileptic seizures, she is looking for a PCP and a counselor in her area and is interested in counseling. She continues to take her keppra and her depakote without any changes.     REVIEW OF SYSTEMS:  A 10-system ROS was performed and in  addition to those items noted in the HPI includes the following:  Denies nausea, vomiting, fever, chills, cough, is concerned about her g-tube      PAST MEDICAL & SURGICAL HISTORY:  DLoree Feeanemia s/p BMT 2016, ovarian failure, gastroparesis, superior mesenteric artery syndrome, J tube, epilepsy      MEDICATIONS, OUTPATIENT:  None       MEDICATIONS, INPATIENT:  Scheduled:Acetaminophen (TYLENOL) 650 mg/20.3 mL Solution 615.1 mg, J-TUBE, ONCE  Lactated Ringers Bolus 1,000 mL, IV, ONCE  LevETIRAcetam (KEPPRA) 100 mg/mL Solution 2,000 mg, J-TUBE, ONCE    IV Fluids & Drips:   PRN:     ALLERGIES:  No Known Allergies    FAMILY HISTORY:  Patient no able to provide    SOCIAL HISTORY:   Social History     Social History    Marital status: SINGLE     Spouse name: N/A    Number of children: N/A    Years of education: N/A     Social History Main Topics    Smoking status: Never Smoker    Smokeless tobacco: None    Alcohol use None    Drug use: None    Sexual activity: Not Asked     Other Topics Concern    None     Social  History Narrative    None        T/E/D: denies all   Lives with:  Boyfriend in [place], recently moved there    EXAMINATION:  VITALS:   CURRENT     MIN - MAX  BP: 97/79     BP: (97)/(79)   Pulse: 78     Pulse Min: 69 Max: 89  Resp: 19     Resp Min: 19 Max: 26  SpO2: 98 %     SpO2 Min: 98 % Max: 100 %  Temp: 37 C (98.6 F)    Temp Min: 37 C (98.6 F) Max: 37 C (98.6 F)    24 HOUR INTAKE/OUTPUT:       WEIGHT:  Weight: 41 kg (90 lb 6.2 oz) (05/07/17 1726)     EXAM  ()General:  NAD  ()HEENT: NC/AT, anicteric sclera, mucosa pink/moist  ()Lungs: CTAB  ()Heart: RRR, S1 and S2 auscultated without c/r/m/g  ()Abd: NT/ND, J tube in place, appears clean, dry, and intact   ()Ext: No clubbing/cyanosis; no LE edema  ()Skin: No rashes forearms, lower legs, or face    Neuro Exam:  ()Mental Status: Pleasant, linear, lucid. Oriented to self, location, situation   ()Use of language:  Fluent  rate, no slur, no paraphasias    ()Cranial Nerves:   CN 1:        Smell testing deferred.   CN 2:        PERRL.  Visual fields full to fingercount.   CN 3,4,6:  EOMI.   CN 5:        Sensation to LT.    CN 7:        Face symmetric.  Eye closure strength full.   CN 8:        Hearing intact to conversation.   CN 9, 10:  Palate elevation symmetric.   CN 11:      Trapezius strength full.   CN 12:      Tongue protrusion midline.    ()Motor:    Tone and bulk:  Decreased       Adventitious movements:     Strength at least 4+/5 through out with encouragement, does not given full effort, no asymmetries     Sensory:     Light touch:  Intact forearms and lower legs*    Coordination:     F-to-N:  No ataxia      Cortical:     Extinction on double-simultaneous tactile stimulation?:  No    -----------------------------------------    DIAGNOSTIC STUDIES:  POC Glucose, blood: --  Lab Results   Lab Name Value Date/Time    WBC 7.0 05/07/2017 05:24 PM    HGB 11.8 (L) 05/07/2017 05:24 PM    HCT 33.7 (L) 05/07/2017 05:24 PM    MCV 93.3 05/07/2017 05:24 PM    PLT 237 05/07/2017 05:24 PM     Lab Results   Lab Name Value Date/Time    NA 137 05/07/2017 05:24 PM    K 3.6 05/07/2017 05:24 PM    CL 104 05/07/2017 05:24 PM    CO2 22 (L) 05/07/2017 05:24 PM    BUN 5 (L) 05/07/2017 05:24 PM    CR 0.59 05/07/2017 05:24 PM    GLU 82 05/07/2017 05:24 PM     Lab Results   Lab Name Value Date/Time    CA 8.9 05/07/2017 05:24 PM     Lab Results   Lab Name Value Date/Time  TP 6.3 05/07/2017 05:24 PM    ALB 3.5 05/07/2017 05:24 PM    TBIL 0.4 05/07/2017 05:24 PM    BILID 0.1 05/07/2017 05:24 PM    ALP 63 05/07/2017 05:24 PM    AST 22 05/07/2017 05:24 PM    ALT 17 05/07/2017 05:24 PM      No results found for: HGBA1C  No results found for: ANASCREEN, ANATITER  No results found for: TSH  No components found for: T4  No results found for: CHOL, LDLC, HDL, TRIG  No results found for: B12, FOLATE, RPR    ABG    No results found for this basename:  ARTPH:*,ARTPCO2:*,ARTPO2:*,ARTO2SAT:*,ARTFIO2PRNT:* in the last 72 hours  VBG    No results found for this basename: VENPH:*,VENPCO2:*,VENPO2:*,VENO2SAT:*,VENFIO2PRCT:* in the last 72 hours    IMAGING STUDIES    EEG 04/17/2017 [facility]:  IMPRESSION:This is an abnormal EEG due to the presence of   excessive fast activity as well as some underlying slowing.     COMMENTS:The abnormality seen in this EEG is consistent with   medication effects. No focal epileptiform activity was present.   No clinical or electrographic seizure was observed.Clinical   correlation is advised.    EEG 04/14/2017 [facility]:  EEG:  Background:  During wakefulness the background activity is symmetric, well   organized, and reactive with a posterior dominant rhythm of 9 Hz.   No generalized background slowing and no focal slowing was noted.   Normal and symmetric sleep patterns were present during drowsy   and sleep states. Normal vertex waves in Stage I sleep; sleep   spindles and K complexes in Stage II sleep; and slow wave sleep   were present    Interictal Activity:  No lateralizing or localizing epileptiform abnormalities were   present.     Clinical events or Electrographic Seizures:  04/14/17 file  8:45 am the patient had an event of concern: intermittent arm   extension but resting on bed, eyes noted to be fluttering, not   responding; this occurs off and on up until 8:53 am.   Electrographically, there is increased muscle and electrode   artifact and then drowsy background is noted, which seems to   correspond to when patient receives Ativan from RN.    ACTIVATION PROCEDURES:   NA    MONITORING COURSE:   Clinically, the patient was stable and the EEG recording was   consistent throughout the monitoring period.    IMPRESSION:   This is a normal approximately 1 day video EEG study. No focal   slowing or epileptiform abnormalities were present.   The episode of concern that occurred during this study did not   have any associated  electrographic changes consistent with   seizures.     CLINICAL CORRELATION:   The event that occurred during this study is most consistent with   Psychogenic Non-epileptic Seizures. This study neither supports   nor refutes a superimposed diagnosis of epilepsy. This should be   correlated clinically for significance.     Preliminary findings were provided to the attending neurologist   while the study was being administered.    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     Patient is well known to our neurology service and has been seen by several of our residents and some attendings for similar presentation in the past, she has a well documented HX PNES including prior EEGs outside hospital with spell capture with no EEG correlation, several events have also been witnessed to abort with nox stim by several members of out team in the past. At this time she is back to baseline. Her exam is non-focal.  She is able to express to me that she understands that her spells are non-epileptic and she is agreeable to seeing a counselor and is working to find a PCP and Automotive engineer near [place].     PLAN / RECOMMENDATIONS:   ()   # PNES   - No need for further inpatient neurologic work up at this time  - Continue current keppra and depakote levels, she will need to have ammonia levels monitored on a regular basis as depakote is known to cause ammonia  elevation, this may be done through a provided in her area  - Agree with establishing care with a counselor   - Patient should establish care with a PCP ASAP   - Given episodes with altered level of consciousness a confidential morbidity report should be filed if one has not recenlt been completed, this was discussed with the ED attending and resident ( By chart review a morbidity report was filed on 04/12/2017)    Patient discussed with senior resident Dr Donnie Aho who is familiar with the patient and agrees with the plan.     Report Electronically Signed By:  Marcine Matar DO   PGY3, Neurology  P1# [phone]  Pager: [phone]  after-hours neurology pager: [phone]  Pediatric Neurology Pager (7am-6pm, M-F): [phone]      =====    NEUROLOGY POST-ROUNDS ADDENDUM, 05/08/2017 12:33:  We discussed and examined the patient on rounds today with Dr. Sylvan Cheese. We agree with the assessment and plan described above. Here are the most pertinent findings and changes to management:    Interval & Subjective:  - G-tube to be replaced by IR    Met with patient at bedside. Confirmed history in above HPI with patient, she reports hitting her head when she had her seizure prior to admission but denies any on going headache, vision changes, or new symptoms. Patient reports that she is planning on moving back to [place] to live with her family, states that she has a plane ticket and will be leaving in 2 days. Reports feeling ready to move back, has a neurologist in [place] that she plans on seeing.     Objective:  Vitals  Temp: 36.6 C (97.9 F) (09/10 1155)  Temp src: Temporal (09/10 1155)  Pulse: 71 (09/10 1155)  BP: 99/60 (09/10 1155)  Resp: 18 (09/10 1155)  SpO2: 100 % (09/10 1155)  Height: 152.4 cm (5') (09/10 1155)  Weight: 43.5 kg (96 lb) (09/10 1155)    EXAM  ()General:  NAD, petite, malnourished appearing, shaved head. During exam, asked patient to stand to examine gait. Patient sat up at the foot of the bed and stated, "I have to warn you, I have  POTS. Do you know what that is?" She continued to state that if she "passes out, just poke me  with a stick to wake me back up." Three people, including myself and the attending, stood near by to provide support as needed. Patient moved from foot of the bed and stood on the floor without assistance. After approximately 5 seconds, patient grimaced her face, closed her eyes and then fell gently forward towards the attending, her head was supported and she laid on the floor for approximately 30 seconds. Pulse was steady and full, observed to have continued respirations. Patient then opened her eyes and sat up on the ground. BP after event was 104/79, Pulse 76.    ()HEENT: NC/AT, anicteric sclera, mucosa pink/moist  ()Abd: NT/ND, no g-tube in place, dry, scant dried blood   ()Ext: No clubbing/cyanosis; no LE edema  ()Skin: No rashes forearms, lower legs, or face    Neuro Exam:  ()Mental Status: Pleasant, linear, lucid. Oriented to month, year, day of week.  ()Use of language:  Fluent rate, no slur, no paraphasias    ()Cranial Nerves:   CN 1:        Smell testing deferred.   CN 2:        PERRL.  Visual fields full to fingercount.   CN 3,4,6:  EOMI.   CN 5:        Sensation to light touch intact.  Masseter strength full.   CN 7:        Face symmetric.  Eye closure strength full.   CN 8:        Hearing intact to conversation.   CN 9, 10:  Palate elevation symmetric.   CN 11:      Trapezius strength full.   CN 12:      Tongue protrusion midline.    ()Motor:    Tone and bulk:  Decreased bulk and unremarkable tone      Adventitious movements:  No resting tremor      Pronator drift:  None     UPPER EXTREMITY STRENGTH TESTING  Motion Muscle Roots Nerve Right Left   arm abduction @~90 deltoid C5,C6 axillary 5 5   supinated, flexion _0  bicep C5,C6 musculo- cutaneous 5 5   extension _1  tricep C6,C7,C8 radial 5 5   grip (multiple) C7-T1 (multiple) 5 5   finger abduction (multiple) C8,T1 ulnar - -     LOWER EXTREMITY  STRENGTH TESTING  Motion Muscle Roots Nerve Right Left   hip flexion iliopsoas L1,L2,L3 femoral 5 5   knee extension quad. femoris L2,L3,L4 femoral 5 5   knee flexion bicep fem., semi -membranosus &  -tendinosus  L5,S1,S2 sciatic 5 5   dorsi- flexion tibials anterior L4,L5 deep fibular 5 5   plantar- flexion gastrocnemius S1,S2 tibial 5 5     REFLEXES  Reflex Roots Nerve Right Left   bicep C5,C6 musculo- cutaneous 1+ 1+   brachio- radialis C5,C6 radial 1+ 1+   tricep C6,C7,C8 radial 1+ 1+   patellar L2,L3,L4 femoral 2+ 2+   Achilles S1,S2 tibial 2+ 2+   plantar response   down down     Sensory:     Light touch:  Intact forearms and lower legs       Romberg Sign:  Unable to test.      Coordination:     F-to-N:  No ataxia       General gait:  Unable to test    New Results and Imaging  05/08/17 VPA level 26    Updated Assessment & Plan  Chloe Barron is a 21 year old  woman with past medical history of Loree Fee anemia s/p BMT 2016, ovarian failure, gastroparesis, superior mesenteric artery syndrome, hypoglycemia, report of seizure disorder, as well documented PNES recently seen here for the same, recently admitted to the neurology service for " seizure like activity" for which she has been intubated and transferred here from OSH for concern for status, on presentation here an evaluation by neurology her events were found to be consistent with seizure like activity.     Patient without any new episodes of abnormal or seizure-like activity. Had one episode of fainting during rounds with attending, but appears to be volitional by patient as she was normotensive and without tachycardiac after episode. Furthermore, patient denied dizziness, weakness, changes in vision, and fatigue after returning to bed. She has a well documented history of PNES, confirmed by multiple EEGs from multiple outside hospitals with spell capture with no EEG correlation. She is currently at her baseline, she has no findings on neurologic exam.  We discussed with her that these spells are non-epileptic in origin. Patient agreed and expressed understanding, confirmed with [place] that she will be following up with outpatient neurology and counseling in [place].     PLAN AND RECOMMENDATIONS:  # PNES  - No need for further inpatient neurologic work up at this time  - Continue current keppra and depakote levels, she will need to have ammonia levels monitored on a regular basis as depakote is known to cause ammonia elevation, this may be done through a provided in her area  - Agree with establishing care with a counselor   - Patient should establish care with a PCP ASAP   - Given episodes with altered level of consciousness a confidential morbidity report should be filed if one has not recenlt been completed, this was discussed with the ED attending and resident ( By chart review a morbidity report was filed on 04/12/2017)    Electronically signed by:  Cathie Olden, MD  Psychiatry, PGY-1  [facility]  Pager: [phone]  PI#: [phone]    The patient was seen, evaluated, and care plan was developed with the resident.  I agree with the assessments and plan as outlined in the resident note. Normal neurologic examination. Of note is that when she attempted to stand up she collapsed, but did not fall to the ground as she was assisted by the examiners. She was unresponsive, but within seconds she started to respond normally again and showed no apparent distress. This episode that we witnessed was clearly functional, but cannot rule out other true epileptic seizures. Thus, we recommend continuing with [facility], but there is no for additional neurologic studies.   Lorain Keast A. Sylvan Cheese, M.D.  Neurology Attending

## 2017-05-07 NOTE — ED Nursing Note (Signed)
944 to Peds BIB [facility], currently seizing.

## 2017-05-07 NOTE — ED Nursing Note (Signed)
Patient is awake and following commands

## 2017-05-07 NOTE — ED Initial Note (Signed)
EMERGENCY DEPARTMENT PHYSICIAN NOTE - Chloe Barron       Date of Service:   05/07/2017  5:12 PM Patient's PCP: No primary care provider on file.   Note Started: 05/07/2017 17:24 DOB: 05/07/1996         Chief Complaint   Patient presents with    Active Seizures       The history provided by the EMS personnel.  Interpreter used: No    Chloe Barron is a 21yr old female, who  has a past medical history of Epilepsy., pseudoseizures, syncope, POTS, diamond blackfan anemia presenting to the ED with a chief complaint of seizure that began earlier tody. Per flight crew patient had multiple seizures today. Patient unable to provide history. Given 10 versed and 2.5 ativan en route. Said that patient's seizures last ~1 minute.        A full history, including past medical, social, and family history (as detailed in this note), was reviewed and updated as necessary.      HISTORY:  Past Medical History   Diagnosis    Epilepsy    No Known Allergies   History reviewed. No pertinent surgical history. No current outpatient prescriptions on file.   Social History    Marital status:                     Spouse name:                       Years of education:                 Number of children:               Occupational History    None on file    Social History Main Topics    Smoking status: Never Smoker                                                                   Smokeless status: Not on file                       Alcohol use: Not on file     Drug use: Not on file     Sexual activity: Not on file          Other Topics            Concern    None on file    Social History Narrative    None on file     No family history on file.        Review of Systems   Unable to perform ROS: Mental status change          TRIAGE VITAL SIGNS:  Temp: (not recorded)  Temp src: (not recorded)  Pulse: 69 (05/07/17 1716)  BP: 97/79 (05/07/17 1716)  Resp: 26 (05/07/17 1717)  SpO2: 100 % (05/07/17 1716)  Weight: (not recorded)    Physical Exam    Constitutional: She is oriented to person, place, and time. She appears well-developed and well-nourished.   HENT:   Head: Normocephalic and atraumatic.   Right Ear: External ear normal.   Left Ear: External ear normal.   Nose: Nose  normal.   Mouth/Throat: Oropharynx is clear and moist.   No tongue laceration   Eyes: Conjunctivae are normal. Pupils are equal, round, and reactive to light.   Neck: Normal range of motion. Neck supple.   Cardiovascular: Normal rate, regular rhythm, normal heart sounds and intact distal pulses.  Exam reveals no gallop and no friction rub.    No murmur heard.  Pulmonary/Chest: Effort normal and breath sounds normal. She has no wheezes. She has no rales.   Abdominal: Soft. Bowel sounds are normal. There is no tenderness. There is no rebound.   Musculoskeletal:   Patient not following commands, moving upper and lower extremities independently   Neurological: She is alert and oriented to person, place, and time.   Neuro exam:  Mentation altered, sleepy  Not speaking, intermittently follow directions  Moving all 4 extremities   Skin: Skin is warm and dry.   Psychiatric: She has a normal mood and affect. Her behavior is normal.   Nursing note and vitals reviewed.         INITIAL ASSESSMENT & PLAN, MEDICAL DECISION MAKING, ED COURSE  Chloe Barron is a [age]yr female who presents with a chief complaint of seizures     Differential includes, but is not limited to: essential seizure disorder, medical-toxicologic exacerbation of underlying seizure disorder, new mass, electrolyte disorder, medical non-compliance.      The results of the ED evaluation were notable for the following:    Pertinent lab results:   Results for orders placed or performed during the hospital encounter of 05/07/17   CBC WITH DIFFERENTIAL     Status: Abnormal   Result Value Status    White Blood Cell Count 7.0 Final    Red Blood Cell Count 3.61 (L) Final    Hemoglobin 11.8 (L) Final    Hematocrit 33.7 (L) Final    MCV 93.3  Final    MCH 32.6 Final    MCHC 34.9 Final    RDW 15.6 (H) Final    MPV 7.5 Final    Platelet Count 237 Final    Neutrophils % Auto 67.1 Final    Lymphocytes % Auto 24.2 Final    Monocytes % Auto 5.7 Final    Eosinophil % Auto 2.2 Final    Basophils % Auto 0.8 Final    Neutrophil Abs Auto 4.7 Final    Lymphocyte Abs Auto 1.7 Final    Monocytes Abs Auto 0.4 Final    Eosinophil Abs Auto 0.2 Final    Basophils Abs Auto 0.1 Final   BASIC METABOLIC PANEL     Status: Abnormal   Result Value Status    Sodium 137 Final    Potassium 3.6 Final    Chloride 104 Final    Carbon Dioxide Total 22 (L) Final    Urea Nitrogen, Blood (BUN) 5 (L) Final    Glucose 82 Final    Calcium 8.9 Final    Creatinine Serum 0.59 Final    E-GFR, African American >60 Final    E-GFR, Non-African American >60 Final   HEPATIC FUNCTION PANEL     Status: Normal   Result Value Status    Protein 6.3 Final    Alkaline Phosphatase (ALP) 63 Final    Aspartate Transaminase (AST) 22 Final    Bilirubin Total 0.4 Final    Alanine Transferase (ALT) 17 Final    Bilirubin Direct 0.1 Final    Albumin 3.5 Final   LIPASE     Status: Normal  Result Value Status    Lipase 23 Final   ETHANOL, PLASMA     Status: None   Result Value Status    ETHANOL, PLASMA  Negative Final   VALPROATE     Status: Abnormal   Result Value Status    Valproate 26 (L) Final   AMMONIA     Status: Abnormal   Result Value Status    Ammonia 47 (H) Final       Pertinent imaging results (reviewed and interpreted independently by me):   None    Radiology reads:   None    Medications   LevETIRAcetam (KEPPRA) 100 mg/mL Solution 2,000 mg (2,000 mg ORAL Given 05/07/17 1916)   Lactated Ringers Bolus 1,000 mL (1,000 mL IV New bag/syringe 05/07/17 1823)   Lorazepam (ATIVAN) Injection 2 mg (2 mg IV Override - Emergency 05/07/17 1719)   Acetaminophen (TYLENOL) 650 mg/20.3 mL Solution 615.1 mg (615.1 mg J-TUBE Given 05/07/17 1831)         Chart Review: I reviewed the patient's prior medical records. Pertinent  information that is relevant to this encounter, prior admission for seizures in the past.    REEVALUATION & ED COURSE:  At the time of discharge home, the patient was reevaluated by myself in the ED. The patient subjectively feels much improved with a significant decrease in symptoms. The patient is currently tolerating PO as well. Repeat exam is reassuring. Vital signs are stable and the patient would like to go home. I explained to the patient alternate diagnoses and the possibility of neurologic pathology. The patient was also advised they should follow up with primary care physician in 1-2 days, and if unable to obtain follow up they may always return to ED. The patient was provided with discharge instructions stating that they should return to ED immediately with any worsening signs or symptoms. The patient demonstrated understanding of the discharge instructions and is comfortable with discharge home. The patient was also given written discharge instructions and acknowledged receipt of discharge instructions.     ATTENDING PHYSICIAN MEDICAL DECISION MAKING:  I saw the patient independently and performed my own focused exam and history. Throughout the patient's ED course, I performed repeated bedside assessments, monitored ongoing vital signs, evaluated and interpreted the diagnostic data, and assessed for response to therapy. The findings of the diagnostic studies performed during this visit were discussed in detail with the patient or their designated representative whenever possible.          PATIENT SUMMARY  Chloe Barron is a 21yr old female with past medical history as outlined above, presenting with seizures.  Patient clinically well-appearing in the ED with normal vital signs, nontoxic. The patient's exam was remarkable for sleepiness and confusion, otherwise grossly normal neuro exam. Patient treated with 2 mg ativan, 1 g Keppra after patient had full body tremors.    Labs remarkable for normal WBC,  hemoglobin 11.8, ammonia slightly elevated to 47, otherwise unremarkable. On re-exam patient is awake, saying that she thinks her J tube balloon is popped, unable to articulate a reason why this might be as it has been functioning normally. Patient asking for narcotic pain medication. 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 Neuro recommends additional 1 g Keppra, no indication for admission, if starts shaking then try a NS drop challenge to eyes.  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 Patient remains hemodynamically stable, afebrile with no further shaking episodes. Says that she can't get someone to pick her up, doesn't have her keys. Social work consulted with assistance in getting her home. Patient signed out oncoming team pending ride home.         LAST VITAL SIGNS:  Temp: 36.7 C (98.1 F) (05/10/17 0037)  Temp src: (not recorded)  Pulse: 89 (05/07/17 1717)  BP: 97/79 (05/07/17 1716)  Resp: 26 (05/07/17 1717)  SpO2: 99 % (05/07/17 1717)  Weight: (not recorded)      Clinical Impression:   Non-epileptic seizure      Disposition: Discharge. Follow up with PCP. ED discharge instructions were reviewed and provided.      PATIENT'S GENERAL CONDITION:  Fair: Vital signs are stable and within normal limits. Patient is conscious but may be uncomfortable. Indicators are favorable.      Electronically signed by: Trisha Mangle, MD, Resident               This patient was seen, evaluated, and care plan was developed with the resident.  I agree with the findings and plan as outlined in our combined note. I personally independently visualized the images and tracings as noted above.      Isa Rankin, MD      Electronically signed by: Isa Rankin, MD, Attending Physician

## 2017-05-07 NOTE — ED Initial Note (Signed)
EMERGENCY DEPARTMENT PHYSICIAN NOTE - Chloe Barron       Date of Service:   05/07/2017 11:41 AM Patient's PCP: No Pcp Per Patient   Note Started: 05/07/2017 12:04 DOB: 08/05/1996             Chief Complaint   Patient presents with    Active Seizures           The history provided by the patient.  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  Patient was dropped off in a state of possible seizures.  During the seizure-like activity she was responsive to me but did not follow commands.  She was tracking with her eyes.  She did not have any loss of bowel or bladder control.  And no apparent dental trauma.  She had a second episode of seizure-like activity here in the emergency department and then had very rapid improvement allergy without a postictal.  And indicated to the nurse immediately where she would like the IV to be placed.  Seizure-like activity at that time also seemed somewhat suspicious for nonepileptic seizures patient is noted to have a history of nonepileptic seizures.  Patient has not given any medical complaints or concerns at this point.         A full history, including pertinent past medical and social history was reviewed and updated as necessary.    HISTORY:  There are no active hospital problems to display for this patient.   Allergies   Allergen Reactions    Amoxicillin Hives    Amoxicillin Hives and Shortness of Breath    Asa [Aspirin] Unknown-Explain in Comments     bleeding    Aspirin Other-Reaction in Comments     Bleeding risk    Basiliximab Angioedema    Basiliximab Hives    Betadine [Povidone-Iodine] Hives    Cheese Nausea/Vomiting    Docusate  Anaphylaxis    Docusate Sodium Anaphylaxis    Doxycycline Rash    Doxycycline Hives and Nausea/Vomiting    Garlic Anaphylaxis    Garlic Anaphylaxis     Reported as per patient    Penicillin Hives    Penicillin Hives    Shellfish Containing Products Anaphylaxis    Shellfish Containing Products Anaphylaxis    Sweet Potato Hives    Sweet Potato Hives    Vancomycin Rash      Past Medical History:  04/11/2017: Asthma  No date: Asthma  No date: Chronic pain  No date: Conductive hearing loss  04/11/2017: Diamond-Blackfan anemia  No date: Diamond-Blackfan anemia  No date: Epilepsy  No date: Essential hypertension  No date: Gastroparesis  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)  04/11/2017: Hypertension  No date: Osteoporosis  No date: Ovarian failure  No date: POTS (postural orthostatic tachycardia syndrom*  No date: Protein calorie malnutrition  No date: Pseudoseizure  No date: PTSD (post-traumatic stress disorder)  04/11/2017: Seizure disorder  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 Past Surgical History:  05/26/2014: Insertion, tympanostomy tube  No date: Jejunostomy tube   Social History    Marital status: SINGLE              Spouse name:  Years of education:                 Number of children:               Occupational History    None on file    Social History Main Topics    Smoking status: Never Smoker                                                                Smokeless status: Never Used                        Alcohol use: No              Drug use: No              Sexual activity: Not on file          Other Topics            Concern    None on file    Social History Narrative    ** Merged History Encounter **         ** Merged History Encounter **          No family history on file.             Review of Systems   Unable to perform ROS: Mental status change       TRIAGE VITAL SIGNS:  Temp: 36.7 C (98 F) (05/07/17 1142)  Temp src: Temporal (05/07/17 1142)  Pulse: 92  (05/07/17 1142)  BP: 110/68 (05/07/17 1142)  Resp: 20 (05/07/17 1142)  SpO2: 100 % (05/07/17 1142)  Weight: 36.3 kg (80 lb) (05/07/17 1142)    Physical Exam   Constitutional: She is oriented to person, place, and time. She appears well-developed.   Cachectic and emaciated with clean dry and intact gastric tube in place.    Having active seizure like activity but verbal and responsive self protecting and answering questions when asked.    HENT:   Head: Normocephalic and atraumatic.   Eyes: EOM are normal. Right eye exhibits no discharge. Left eye exhibits no discharge.   Neck: Normal range of motion. Neck supple.   Cardiovascular: Normal rate and normal heart sounds.    No murmur heard.  Pulmonary/Chest: Effort normal and breath sounds normal. No respiratory distress. She has no wheezes. She has no rales.   Abdominal: Soft. Bowel sounds are normal. She exhibits no mass. There is no tenderness. There is no rebound and no guarding.   Abdomen soft, nontender, nondistended, no obvious signs of infection or trauma.   Musculoskeletal: Normal range of motion. She exhibits no edema, tenderness or deformity.   Neurological: She is alert and oriented to person, place, and time. No cranial nerve deficit. She exhibits normal muscle tone.   Occasional tonic clonic type activity with responsiveness during it.    Skin: Skin is warm and dry.   Psychiatric: She has a normal mood and affect. Thought content normal.   Nursing note and vitals reviewed.        INITIAL ASSESSMENT & PLAN, MEDICAL DECISION MAKING, ED COURSE  Chloe Barron is a 17yr female who presents with a chief complaint of seizure like activity.  Differential includes, but is not limited to: PNES, seizure, electrolyte disturbance, and many others.      The results of the ED evaluation were notable for the following:    Pertinent lab results:   Results for orders placed or performed during the hospital encounter of 05/07/17   CBC WITH DIFFERENTIAL     Status: Abnormal    Result Value Status    White Blood Cell Count 6.1 Final    Red Blood Cell Count 3.72 (L) Final    Hemoglobin 12.0 Final    Hematocrit 34.5 (L) Final    MCV 92.7 Final    MCH 32.3 Final    MCHC 34.8 Final    RDW 14.4 Final    MPV 9.3 (L) Final    Platelet Count 223 Final    Neutrophils % Auto 64.2 Final    Lymphocytes % Auto 25.7 Final    Monocytes % Auto 6.3 Final    Eosinophil % Auto 3.0 Final    Basophils % Auto 0.5 Final    Immature Granulocytes % 0.30 Final    Neutrophil Abs Auto 3.89 Final    Lymphocyte Abs Auto 1.6 Final    Monocytes Abs Auto 0.4 Final    Eosinophil Abs Auto 0.2 Final    Basophils Abs Auto 0.0 Final    Nucleated RBC % Auto 0.0 Final    NRBC Abs Auto 0.0 Final    Immature Granulocytes Abs Auto 0.0 Final   COMPREHENSIVE METABOLIC PANEL     Status: Normal   Result Value Status    Sodium 139 Final    Potassium 3.7 Final    Chloride 106 Final    Carbon Dioxide Total 24 Final    Urea Nitrogen, Blood (BUN) 9 Final    Glucose 95 Final    Calcium 9.3 Final    Protein 6.8 Final    Albumin 3.7 Final    Alkaline Phosphatase (ALP) 65 Final    Aspartate Transaminase (AST) 22 Final    Bilirubin Total 0.5 Final    Alanine Transferase (ALT) 17 Final    Creatinine Serum 0.64 Final    Globulin 3.1 Final    Alb/Glob Ratio 1.2 Final    BUN/ Creatinine 14.1 Final    E-GFR 119 Final    E-GFR, Non-African American 119 Final   URINALYSIS-COMPLETE     Status: Abnormal   Result Value Status    Color Yellow Final    Clarity Hazy (Abnl) Final    Specific Gravity  1.015 Final    pH Urine 6.0 Final    Protein Negative Final    Glucose Negative Final    Ketones Negative Final    Bilirubin Negative Final    Urobilinogen Negative Final    Occult Blood  Negative Final    Leukocyte Esterase Negative Final    Nitrite Negative Final    RBC <1 Final    WBC 1 Final    Bacteria Few Final    Squamous Epithelial Cells 1 Final    Mucous Few Final   VALPROATE     Status: Abnormal   Result Value Status    Valproate <10.0 (L) Final        Medications   Lorazepam (ATIVAN) Injection 1 mg (1 mg IV Given 05/07/17 1200)         Chart Review: I reviewed the patient's prior medical records. Pertinent information that is relevant to this encounter previous visits meds and treatments.  Patient Summary: Pt did not appear to be having actual seizures though there is some strong question of compliance with medications. Per patient she has been taking them but per her significant other at bedside she has been not compliant for a couple days at least. She has no focal neurologic deficits. She is under a great deal of stress and anxiety and I feel treating that will help and strongly recommended she get back into counseling and work close with pcp.       LAST VITAL SIGNS:  Temp: 36.7 C (98 F) (05/07/17 1142)  Temp src: Temporal (05/07/17 1142)  Pulse: 92 (05/07/17 1142)  BP: 110/68 (05/07/17 1142)  Resp: 20 (05/07/17 1142)  SpO2: 100 % (05/07/17 1142)  Weight: 36.3 kg (80 lb) (05/07/17 1142)      Clinical Impression:   Problem List Items Addressed This Visit        Neuropsychological    * (Principal)Psychogenic nonepileptic seizure - Primary       Other    Other social stressor    Anorexia    Non compliance w medication regimen                    Disposition: Discharge. Follow up with pcp and psychiatrist. ED discharge instructions were reviewed and provided.      PATIENT'S GENERAL CONDITION:  Good: Vital signs are stable and within normal limits. Patient is conscious and comfortable. Indicators are excellent.     Electronically signed by: Brain Hilts, MD

## 2017-05-07 NOTE — ED Triage Note (Signed)
Pt Brought in by fiancee-pt with a seizure x's 2nd or 3 rd seizure today- pt was having a sz in the car on the way to the ER- pt brought to the amb bay- pt placed directly on gurney and taken to room 5

## 2017-05-07 NOTE — ED Nursing Note (Signed)
Patient awake and speaking in full sentences at this time.

## 2017-05-07 NOTE — ED Nursing Note (Signed)
Pt BIB [facility] for status epilepticus, seizing during transport. Pt arrives post-ictal, ED team at bedside.     Hx epilepsy

## 2017-05-07 NOTE — ED Nursing Note (Signed)
Patient discharged from ED with AVS, Rx, related instructions and all belongings. Patient is in NAD, is awake/alert skin, is pink/warm/dry. Pt leaves the ED wheelchair with family.

## 2017-05-07 NOTE — ED Nursing Note (Signed)
Pt currently answering questions with head nods/shakes.

## 2017-05-07 NOTE — ED Nursing Note (Signed)
Pt getting dressed and into a wheelchair

## 2017-05-08 ENCOUNTER — Emergency Department (HOSPITAL_BASED_OUTPATIENT_CLINIC_OR_DEPARTMENT_OTHER): Payer: Medicaid Other

## 2017-05-08 DIAGNOSIS — J45909 Unspecified asthma, uncomplicated: Secondary | ICD-10-CM

## 2017-05-08 DIAGNOSIS — E43 Unspecified severe protein-calorie malnutrition: Secondary | ICD-10-CM | POA: Insufficient documentation

## 2017-05-08 DIAGNOSIS — Z934 Other artificial openings of gastrointestinal tract status: Secondary | ICD-10-CM

## 2017-05-08 DIAGNOSIS — Z434 Encounter for attention to other artificial openings of digestive tract: Secondary | ICD-10-CM

## 2017-05-08 DIAGNOSIS — K3184 Gastroparesis: Secondary | ICD-10-CM

## 2017-05-08 DIAGNOSIS — I951 Orthostatic hypotension: Secondary | ICD-10-CM

## 2017-05-08 DIAGNOSIS — R569 Unspecified convulsions: Secondary | ICD-10-CM

## 2017-05-08 DIAGNOSIS — F329 Major depressive disorder, single episode, unspecified: Secondary | ICD-10-CM

## 2017-05-08 DIAGNOSIS — R000 Tachycardia, unspecified: Secondary | ICD-10-CM

## 2017-05-08 LAB — PHOSPHORUS (PO4): PHOSPHORUS (PO4): 4.1 mg/dL (ref 2.4–5.0)

## 2017-05-08 LAB — URINALYSIS-COMPLETE
BILIRUBIN URINE: NEGATIVE
GLUCOSE URINE: NEGATIVE mg/dL
KETONES: NEGATIVE mg/dL
LEUK. ESTERASE: NEGATIVE
NITRITE URINE: NEGATIVE
OCCULT BLOOD URINE: NEGATIVE mg/dL
PH URINE: 8 (ref 4.8–7.8)
PROTEIN URINE: NEGATIVE mg/dL
RBC: 1 /HPF (ref 0–5)
SPECIFIC GRAVITY, URINE: 1.008 (ref 1.002–1.030)
SQUAMOUS EPI: 1 /HPF (ref 0–5)
UROBILINOGEN.: NEGATIVE mg/dL (ref ?–2.0)
WBC: 1 /HPF (ref 0–5)

## 2017-05-08 LAB — MAGNESIUM (MG)
MAGNESIUM (MG): 2 mg/dL (ref 1.5–2.6)
MAGNESIUM (MG): 2 mg/dL (ref 1.5–2.6)

## 2017-05-08 MED ORDER — ERYTHROMYCIN 250 MG TABLET,DELAYED RELEASE
250.0000 mg | DELAYED_RELEASE_TABLET | Freq: Three times a day (TID) | ORAL | Status: DC
Start: 2017-05-08 — End: 2017-05-10
  Administered 2017-05-08 – 2017-05-10 (×5): 250 mg via ORAL
  Filled 2017-05-08 (×6): qty 1

## 2017-05-08 MED ORDER — FLUOXETINE 20 MG/5 ML (4 MG/ML) ORAL SOLUTION
60.0000 mg | Freq: Every day | ORAL | Status: DC
Start: 2017-05-08 — End: 2017-05-10
  Administered 2017-05-08 – 2017-05-10 (×3): 60 mg via ORAL
  Filled 2017-05-08 (×3): qty 15

## 2017-05-08 MED ORDER — FAMOTIDINE 20 MG TABLET
20.0000 mg | ORAL_TABLET | Freq: Two times a day (BID) | ORAL | Status: DC
Start: 2017-05-08 — End: 2017-05-10
  Administered 2017-05-08 – 2017-05-10 (×5): 20 mg via ORAL
  Filled 2017-05-08 (×5): qty 1

## 2017-05-08 MED ORDER — NACL 0.9% IV INFUSION
INTRAVENOUS | Status: DC
Start: 2017-05-08 — End: 2017-05-09
  Administered 2017-05-08 – 2017-05-09 (×2): via INTRAVENOUS

## 2017-05-08 MED ORDER — LORAZEPAM 1 MG TABLET
2.0000 mg | ORAL_TABLET | Freq: Once | ORAL | Status: DC
Start: 2017-05-09 — End: 2017-05-08

## 2017-05-08 MED ORDER — MONTELUKAST 10 MG TABLET
10.0000 mg | ORAL_TABLET | Freq: Every day | ORAL | Status: DC
Start: 2017-05-08 — End: 2017-05-08

## 2017-05-08 MED ORDER — ERYTHROMYCIN 250 MG TABLET,DELAYED RELEASE
250.0000 mg | DELAYED_RELEASE_TABLET | ORAL | Status: DC
Start: 2017-05-08 — End: 2017-05-08

## 2017-05-08 MED ORDER — PROMETHAZINE 6.25 MG/5 ML ORAL SYRUP
12.5000 mg | ORAL_SOLUTION | ORAL | Status: DC | PRN
Start: 2017-05-08 — End: 2017-05-10
  Administered 2017-05-08 – 2017-05-09 (×4): 12.5 mg via ORAL
  Filled 2017-05-08 (×5): qty 10

## 2017-05-08 MED ORDER — VALPROIC ACID (AS SODIUM SALT) 250 MG/5 ML (5 ML) ORAL SOLUTION
250.0000 mg | Freq: Two times a day (BID) | ORAL | Status: DC
Start: 2017-05-08 — End: 2017-05-10
  Administered 2017-05-08 – 2017-05-10 (×5): 250 mg via ORAL
  Filled 2017-05-08 (×6): qty 5

## 2017-05-08 MED ORDER — HYDROMORPHONE 1 MG/ML INJECTION SYRINGE
1.0000 mg | INJECTION | Freq: Once | INTRAMUSCULAR | Status: AC
Start: 2017-05-08 — End: 2017-05-08
  Administered 2017-05-08: 1 mg via INTRAVENOUS
  Filled 2017-05-08: qty 1

## 2017-05-08 MED ORDER — PREGABALIN 25 MG CAPSULE
75.0000 mg | ORAL_CAPSULE | Freq: Three times a day (TID) | ORAL | Status: DC
Start: 2017-05-08 — End: 2017-05-10
  Administered 2017-05-08 – 2017-05-10 (×7): 75 mg via ORAL
  Filled 2017-05-08 (×7): qty 1

## 2017-05-08 MED ORDER — LEVETIRACETAM 500 MG/5 ML (5 ML) ORAL SOLUTION
750.0000 mg | Freq: Two times a day (BID) | ORAL | Status: DC
Start: 2017-05-08 — End: 2017-05-10
  Administered 2017-05-08 – 2017-05-10 (×5): 750 mg via ORAL
  Filled 2017-05-08 (×5): qty 10

## 2017-05-08 MED ORDER — HYDROMORPHONE 2 MG TABLET
2.0000 mg | ORAL_TABLET | ORAL | Status: DC | PRN
Start: 2017-05-08 — End: 2017-05-10
  Administered 2017-05-08 – 2017-05-10 (×4): 2 mg via ORAL
  Filled 2017-05-08 (×4): qty 1

## 2017-05-08 MED ORDER — ACETAMINOPHEN 325 MG TABLET
650.0000 mg | ORAL_TABLET | ORAL | Status: DC | PRN
Start: 2017-05-08 — End: 2017-05-10
  Administered 2017-05-09 – 2017-05-10 (×3): 650 mg via ORAL
  Filled 2017-05-08 (×3): qty 2

## 2017-05-08 MED ORDER — MONTELUKAST 10 MG TABLET
10.0000 mg | ORAL_TABLET | Freq: Every day | ORAL | Status: DC
Start: 2017-05-08 — End: 2017-05-10
  Administered 2017-05-08 – 2017-05-09 (×2): 10 mg via ORAL
  Filled 2017-05-08 (×2): qty 1

## 2017-05-08 MED ORDER — ERYTHROMYCIN 250 MG TABLET,DELAYED RELEASE
250.0000 mg | DELAYED_RELEASE_TABLET | Freq: Three times a day (TID) | ORAL | Status: DC
Start: 2017-05-08 — End: 2017-05-08

## 2017-05-08 MED ORDER — ONDANSETRON HCL (PF) 4 MG/2 ML INJECTION SOLUTION
4.0000 mg | Freq: Two times a day (BID) | INTRAMUSCULAR | Status: DC | PRN
Start: 2017-05-08 — End: 2017-05-10
  Administered 2017-05-10: 4 mg via INTRAVENOUS
  Filled 2017-05-08: qty 2

## 2017-05-08 MED ORDER — MIDAZOLAM (PF) 1 MG/ML INJECTION SOLUTION
1.0000 mg | Freq: Once | INTRAMUSCULAR | Status: AC
Start: 2017-05-08 — End: 2017-05-08
  Administered 2017-05-08: 1 mg via INTRAVENOUS
  Filled 2017-05-08: qty 2
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
## 2017-05-08 NOTE — ED Nursing Note (Signed)
returned from IR.  awake alert. feels stressed.

## 2017-05-08 NOTE — ED Nursing Note (Signed)
Patient asked to flush GJ tube, flushes without resistance. Site looks clean and dry, no redness noted.

## 2017-05-08 NOTE — ED Nursing Note (Signed)
pt ambulated to br steady gait. agrees to IR placement of  g tube. ir notified

## 2017-05-08 NOTE — ED Nursing Note (Signed)
Covering lunch for Debbie RN.

## 2017-05-08 NOTE — Nurse Focus (Signed)
Radiology Transfer Note    Note Started: 05/08/2017, 13:26     Radiology Procedure: GJ tube replacement     Total Meds received during procedure:   Antibiotic: NA   Midazolam: 1 mg      Other medications given during the procedure: Dilaudid 1 mg     Time of last dose: 1245  Blood loss: Minimal  Amount of fluids patient received: 0.9% NaCl 50 ml.    Puncture site: LUQ (old surgical site)  Any precaution: infection/bleeing   Comment : Unsuccessful GJ tube placement     Report given to RN Debbie. Patient transferred at 1330 hours to ED b09 unit by gurney.  Pt condition stable.  Patient's belonging's not applicable. Kara MeadEmma Caelyn Route

## 2017-05-08 NOTE — ED Nursing Note (Signed)
Pt. Sleeping. rr even and nl. Skin color good. Mont/tx cont.

## 2017-05-08 NOTE — ED Nursing Note (Signed)
pt reports  she didn't feel dizzy when ambulated but felt dizzy in the br . pt did not tell anyone  that she felt dizzy.

## 2017-05-08 NOTE — ED Nursing Note (Signed)
[employer] speaking to pt.

## 2017-05-08 NOTE — H&P (Signed)
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 Patient says she was in her usual state of health up until day of admission when she had a seizure and fell from the steps of her trailer. She said when she came too she noticed that her J-tube balloon did not feel right and noticed blood over her body. Her boyfriend call EMS as she continued to have seizures and she was airlifted to [facility]. Her work-up in the ER was unremarkable. She was evaluated by neurology and seizures thought to be pseudoseizures. As patient was getting ready to be discharged from ER her J-tube fell out but per EMR documentation and discussion with care team concern that patient pulled J-tube out intentionally.    IR attempted to replace J-tube with conscious sedation but were unsuccessful as existing percutaneous tract to jejunum was closed despite multiple attempts. ACS was consulted for surgical evaluation for J-tube placement but recommend placement of naso-enteric tube due to concerns that patient intentionally pulled tube out and non-emergent need for replacement. Patient can then follow up with outpatient surgeon, Dr. Siri Colehayor, or [place] surgery team.     Of note, patient was recently hospitalized from 8/26-9/5 after patient had multiple pseudoseizures and needed J-tube replaced. At that time her J-tube was replaced by IR on 8/28 after it was damaged by her pet hamster and later replaced when it was clogged on 8/31 with general anesthesia.     ROS:  Constitutional: tired.  Eyes: negative.  Ears, Nose,  Mouth, Throat: negative.  CV: negative.  Resp: negative.  GI: negative.  GU: Feels like she has a urine infection.  Musculoskeletal: negative.  Integumentary: negative.  Neuro: negative.  Psych: Mood pt's report, euthymic.  Endo: negative.  Heme/Lymphatic: negative.  Allergy/Immun: negative.    ED COURSE  Initial VS:  --T: 36.7C; HR: 69; BP: 97/79; RR: 26, 100% RA  --LR 1L x 1  --IV Ativan 2 mg x 1  --IV Keppra 2 g x 1    PAST MEDICAL HISTORY  --Pseudoseizures with multiple hospitalizations   --Diamond-blackfan anemia s/p BMT   --Chronic GVHD   --SMA syndrome  --POTS  --Gastroparesis s/p J-tube  --Severe malnutrition   --Conductive hearing loss  --Ovarian failure   --Osteoporosis   --Chronic pain   --PTSD    --Depression     PRIOR TO ADMISSION MEDICATIONS   Prior to Admission Medications   Prescriptions   Acetaminophen (TYLENOL) 325 mg Tablet   Sig: 325 mg every 6 hours if needed. Indications: Fever   Cetirizine (ZYRTEC) 10 mg Capsule   Sig: 10 mg every evening with a meal.   DiphenhydrAMINE (BENADRYL) 25 mg Capsule   Sig: Take 50 mg by mouth every 8 hours.   Divalproex (DEPAKOTE) 500 mg Delayed Release Tablet   Sig: Take 500 mg by mouth 2 times daily.   Dronabinol (MARINOL) 10 mg Capsule   Sig: 10 mg 3 times daily.   Esomeprazole (NEXIUM) 20 mg Delayed Release Capsule   Sig: Take 20 mg by  mouth every morning before a meal.   Estradiol Biweekly 0.1 mg/24 hr Patch   Sig: Apply 1 patch to the skin every Saturday.   FLUoxetine 60 mg Tablet   Sig: 60 mg every day at bedtime.   FamoTIDine (PEPCID) 20 mg Tablet   Sig: 20 mg 2 times daily.   Hydrocortisone (CORTEF) 10 mg Tablet   Sig: 15 mg every 8 hours if needed (fever/illness).   Hydromorphone (DILAUDID) 2 mg Tablet   Sig: 2 mg every 4 hours if needed.   LevETIRAcetam (KEPPRA) 100 mg/mL Liquid   Sig: 7.5 mL 2 times daily.   Lorazepam (ATIVAN) 2 mg Tablet   Sig: Take 2 mg by mouth every 8 hours if needed.   Metoclopramide (REGLAN) 10 mg Tablet   Sig: 10 mg 4 times daily,  before meals and at bedtime.   Montelukast (SINGULAIR) 10 mg Tablet   Sig: 10 mg every evening.   Ondansetron (ZOFRAN) 4 mg/5 mL solution   Sig: 4 mg three times daily if needed.   calcium carbonate (TUMS 500 PO)   Sig: 750 mg 4 times daily if needed.   melatonin 10 mg Tablet   Sig: every day at bedtime if needed.      Facility-Administered Medications: None     ALLERGIES: No Known Allergies    PAST SURGICAL HISTORY   No history of chest or abdominal surgery  J tube placed 8/15    FAMILY HISTORY   No family history on file.    SOCIAL HISTORY   RESIDENCE: Lives with fiance in [place]   EtOH, tobacco, illicit: unable to obtain; chart review says none    VITAL SIGNS:  Vital Signs (Last Recorded):  Temp: 36.7 C (98 F)  BP: 112/67  Pulse: 66  Resp: 14  SpO2: 100 %      Weight: 43.5 kg (96 lb)     PHYSICAL EXAM:  General: Young woman, shaved head, chronically ill appearing, no acute distress  HEENT: EOMI, MMM, OP clear  Cardiac: RRR, no m/r/g  Lungs: CTAB, no wheeze or rhonchi  Abd: +bs, soft, non-tender, LLQ j-tube site, c/d/i.  Ext: No c/c/e, warm and well perfused  Skin: No c/c/e, warm and well perfused   Neuro: Alert and oriented, moving all extremities.     LABORATORY STUDIES  CBC:   Recent labs for the past 72 hours     05/07/17 1724    WHITE BLOOD CELL COUNT 7.0    RED CELL COUNT 3.61*    HEMOGLOBIN 11.8*    HEMATOCRIT 33.7*    MCV 93.3    MCH 32.6    RDW 15.6*    MPV 7.5    PLATELET COUNT 237      CHEM 10:  Recent labs for the past 72 hours     05/07/17 1724    SODIUM 137    POTASSIUM 3.6    CHLORIDE 104    CARBON DIOXIDE TOTAL 22*    UREA NITROGEN, BLOOD (BUN) 5*    CREATININE BLOOD 0.59    GLUCOSE 82    CALCIUM 8.9    MAGNESIUM (MG) --    PHOSPHORUS (PO4) --     Liver Function Tests:  Recent labs for the past 24 hours     05/07/17 1724    ASPARTATE TRANSAMINASE (AST) 22    ALANINE TRANSFERASE (ALT) 17    ALKALINE PHOSPHATASE (ALP) 63    ALBUMIN 3.5    PROTEIN 6.3  BILIRUBIN TOTAL  0.4     URINALYSIS - CHEM ONLY Recent labs for the past 24 hours     05/08/17 1136    COLLECTION CLEAN CATCH    COLOR Yellow    CLARITY Sl Turbid    PH URINE 8.0    OCCULT BLOOD URINE Negative    BILIRUBIN URINE Negative    KETONES Negative    GLUCOSE URINE Negative    PROTEIN URINE Negative    UROBILINOGEN. Negative    NITRITE URINE Negative    LEUK. ESTERASE Negative     Misc Labs:   Ammonia: 47  Lipase 23  Valproate 26   Ethanol:L negative     IMAGING AND PROCEDURES  IR G-J Fluro placement: pending     ASSESSMENT & PLAN:      # Gastroparesis with J-Tube dependence s/p removal  J-tube placed on 8/15 at [facility] medical center with Dr. Hebert Soho. Replaced on 8/28 after had J-tube leak after animal bite (pet hamster) and exchanged on 8/28 after it was clogged. Patient was medically cleared and set for discharge from ER but J-tube fell out. Per ER team, concern that patient intentionally pulled it out. Unable to replace with IR as percutaneous tract to jejunum closed. No urgent surgical indication for replacement.  --Place naso-enteric tube, post pylorus   --Erythromycin 250 mg daily   --KUB to assess placement   --Will consult GI for endoscopic nasoenteric tube placement if unsuccessful   --Dietician consult- will need to tolerate tube feeds prior to discharge   --IV Zofran prn     # Pseudoseizure  Presented with seizure like activity to OSH and transferred to [place]. 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   --Appreciate neurology assistance   --Continue Keppra + Depakote  --Monitor keppra, depakote, and ammonia levels   --Seizure precautions     # POTS  History of POTS with reports of positive tilt table test in the past. Noted by nurse to have a fall earlier in the day.   --Compression stockings  --Continue to monitor     # Severe protein calorie malnutrition: has unintentional weight loss over past 1 year.    #  Depression  --Continue Prozac 60 mg daily     # Asthma. No e/o exacerbation  - cont singulair    DVT Prophylaxis: SCD  FEN: NPO except meds, sips, NS 75 cc/hr  Code status: Full  Disposition: pending     PRESENT ON ADMISSION:  Are any of the following five conditions present or suspected on admission: decubitus ulcer, infection from an intravascular device, infection due to an indwelling catheter, surgical site infection or pneumonia? No.        Report electronically signed by:  Eugenia Mcalpine, MD   Hospitalist Division  Pager: [phone]  PI: [phone]

## 2017-05-08 NOTE — ED Nursing Note (Signed)
Pt sleeping on gurney. When attempting to talk to pt about dispo, pt sleeping and refusing to have conversation with RN. Remains in line of site of RN station.

## 2017-05-08 NOTE — ED Progress Note (Signed)
.  I assumed care of this patient at 0600 while the patient was undergoing evaluation for pseudoseizures. I evaluated the patient, discussed the case with the previous team, and reviewed the relevant studies. For details of H&P, original assessment and plan, see initial ED note for this visit.     At the time of transfer of care, she had agreed and made arrangements for discharge, however she then went to the bathroom and pulled out her own G-J Tube.     On my initial evaluation, the patient sobbing stating she did not pull out her G-Tube, rather " it fell out."ABD soft, BS nl, GJ tube site not bleeding. Pt will need  replacement of GJ-Tube and further psychiatric management.     Medication regimen per Pt statement this morning is different than prior hospitalization. Given her unstable mental status, and recent events, have restarted medication regimen per 04/23/2017 Encounter discharge instructions.   Spoke to Dr. Jodie Echevariaan, Adult Hospitalist for admission.     Final decision re: GJT - was taken to IR for placement however, track closed and they were unable to replace the tube.    Returned to ED, Surg consult contacted for admission for GJT surgical replacement.  If they refuse, Medicine will admit patient.  She has been evaluated by PES who does not feel that patient meets 5150 requirements.    Plan is for admission for GJT replacement and then d/c - has flight scheduled to return to parental home in 2 days.       Final Diagnosis:    Pseudoseizures  Disrupted GJ tube    .Report Electronically Signed by:   Georgianne Fickachel Robertson, MD   Department of Pediatrics, PGY 2  05/08/2017, 07:15  PI #[id number]   Pager [phone]    Electronically signed by:  Marolyn Hallerheryl Davon Abdelaziz, MD, Attending Physician    Professor, Department of Emergency Medicine

## 2017-05-08 NOTE — Allied Health Progress (Addendum)
PES received a request to see patient, as she will not be admitted into the hospital. Per MD patient went to the restroom and pulled out her G-Tube, however will not admit that she did so. MD is requesting that patient received a PES evaluation due to pulling out g-tube and pseudo seizures      Chart review indicated patient has had several contacts with PES since arrival to the ED- however MD now states they are requesting a full psychiatric evaluation.     Tomasita Crumblelison Adelaida Reindel, [facility], LCSW  [phone] Pager

## 2017-05-08 NOTE — ED Nursing Note (Signed)
Assumed pt. Care. Pt. Sleeping in L side lying. rr even and nl.easy to arouse to gentle touch. Pt. Cachectic . Denies needs at this time. Mont/tx cont.

## 2017-05-08 NOTE — Procedures (Signed)
INTERVENTIONAL RADIOLOGY  POST PROCEDURE NOTE    Date of Service: 05/08/2017    Pre-Op Diagnosis:  J-tube dependent, gastroparesis    Post-Op Diagnosis:  same    Procedure Performed/Description:  Attempt at replacing J-tube in existing tract    Name of Surgeon and Assistants:      Attending: Amie PortlandBahman Roudsari  Resident/Fellow: Michel SanteeJason Van Rompaey     Type of Anesthesia:  Conscious sedation      Specimens Removed:  none    EBL:  <35mL    Drains:  none    Fluids:  See nursing    Complications:  None immediate    Findings:      1. Existing percutaneous tract to the jejunum was closed. Unable to open and re-establish access using various techniques     Plan:  Surgical evaluation for J-tube placement.     Rebecka ApleyJason Van Rompaey, MD  Vascular and Interventional Radiology  [phone]

## 2017-05-08 NOTE — ED Nursing Note (Signed)
pt sleeping. no distress.  commode in room.

## 2017-05-08 NOTE — ED Nursing Note (Signed)
pt returned from br. and was found outside of room on the floor w her eyes open not responding. moved to board placed back in gurney. immediately "woke up"  sat up, apolagized. pt reports she "passes out alot" and usually gashes her head." now requests tv and is lying comfortably watching tv.

## 2017-05-08 NOTE — ED Nursing Note (Signed)
Report given back to Debbie RN.

## 2017-05-08 NOTE — ED Nursing Note (Signed)
Report and care to debby, RN

## 2017-05-08 NOTE — ED Nursing Note (Signed)
assumed care. pt tearful that "no one believes her" [employer] here to confiscate , 2 vials of ativan and zofran which she reports she got in [place].. pt states she uses her g tube for  continuous feeds. .Marland Kitchen

## 2017-05-08 NOTE — ED Nursing Note (Addendum)
Pt in hallway crying. States " I don't know what to do." pt asked about ride home, pt asked to use phone. Pt called mother. Pt tearful. Pt requesting RN speaks to pt's mother. Pt gave verbal consent for RN to speak with mother. On phone, pt's mother Darrick PennaMelissa Woodie [phone] requesting to have "adult service" investigate pt's home she shares with boyfriend. moc states pt is losing weight, does not have a bed. Informed moc that RN will contact SS/PES to speak with pt and SS/PES has already spoken with pt several hours ago. Pt mother requesting to speak with SS/PES. Mother stated pt has a plane ticket to go back to [place] on 05/10/2017.     RN spoke with Maralyn SagoSarah PES regarding pt claims and concerns/options. Sarah informed RN that pt's boyfriend will be picking pt up in am. Furthermore, pt is not gravely disabled, have conservator or reason to stay in ED. Pt did not mention the concerns expressed by mother and pt about not wanting to go home. Adult services is not an option per Maralyn SagoSarah.    RN spoke to pt regarding options/concerns. Spoke to family about [facility], contact PD for safety, staying at a motel. Pt tearful and states "I don't know what to do." pt states she does not want to "burn bridges" with boyfriend. Pt requesting to go back to boyfriends home. Offered L[employer] ride. Pt does not have a key to her home. Pt informed she is discharged and can leave if she would like because there is no medical necessity to stay in the ED.    At this point, pt states "my G-J is not working." I informed pt that her G-J was evaluated by Dr Fawn KirkKrisjanssen last night after pt states her G-J was not working last night. MD found G-J to be working. Pt states "I do not remember that." RN informed pt to consider options and inform RN what she would like to do.

## 2017-05-08 NOTE — Allied Health Progress (Signed)
CLINICAL SOCIAL SERVICES   CRISIS SERVICES PSYCHIATRIC DIAGNOSTIC INTERVIEW  Note Date and Time: 05/08/2017    13:51  Date of Admission: 05/07/2017  5:12 PM    Date of Service:  05/08/17  Referred By: medical team   Patient Name: Chloe Barron 9Ct Ethnicity: The patient's reported ethnicity is caucasian , and she identifies as Not Hispanic or Latino.        DOB: 07/31/1996  Age: [age]yr      Referral Source:Emergency Department: Attending  Reason for Referral: psychiatric       HISTORY OF PRESENTING ILLNESS  Pt was referred for PES eval by MD's for concerns for pt's behavior which do not appear to be in pt's best pro-social interests. Pt has multiple medical problems requiring g-tube. Pt also has seizures and / or possible pseudo seizures. Pt came in to the ER today for seizure activity. During pt's stay here in the ER, pt's behavior was odd, tearful, regressed. Two different SW spoke with pt on two different occassions, both times which pt was future oriented, had no thought or intention to harm herself. Pt does have unstable living situation with boyfriend, but [place].  During the course of tx in the ER,   pt was found by medical team to have these medications in her possession;  Zofran, Ativan, dilaudid which pt reports she brought from [place]. These medications were passed along to [employer].  Also during the course of tx pt complained that her g-tube was not working right, then the tube came out. Pt reports it was dislodged when she fell when having a seizure and when the medical team "flushed the tube, they didn't realize it was already dislodged internally".  The dislodging of pt's g-tube has now delayed pt's discharge. This procedure is complicated and requires that pt is admitted to have it re-inserted.   There is concerns that pt took the tube out herself to intentionally delay her discharge.     PAST PSYCHIATRIC HISTORY(include family)  Pt denies previous SI behaviors, denies  previous psychiatric hospitalizations.   Chart review finds old MR [id number]. SW notes do not document any concerns regarding a mental illness or prior SI.     PAST MEDICAL HISTORY  Pt has a hx of (Per MD note: diamond-blackfan anemia s/p bone marrow transplant complicated by chronic GVHD, mesenteric artery syndrome, gastroparesis s-p J tube placed on 8/15, POTS and seizure disorder with known non-epileptic     HISTORY OF SUBSTANCE ABUSE  None reported. Pt found to have Rx meds as stated above.   Pt denies Rx abuse.     PSYCHOSOCIAL HISTORY  Pt is currently living with 19 yr old boyfriend and his [place] area [place]  This area is far from any town or services, there is not any public transportation.   Pt reports the family has one vehicle, it does not belong to her boyfriend. Boyfriend is [name] : home phone of [phone].   Pt's mother is Darrick PennaMelissa Woodie [phone]. Mother lives in [place]        Patient's Contact Info:   [address]  [place] [id number]  Phone: [phone] (home)      MENTAL STATUS EXAM    Alert: yes  Oriented: Person, Place, Date and Time  Appearance: caucasian young  woman,thin, looking stated age, hair growing back from  being shaved or fallen out from illness,  perhaps 1/4 long. Pt wearing hospital gown, using home blanket as a pillow.   Behaviour: unremarkable and calm  Attitude: cooperative  Eye Contact: good  Affect: tearful  Mood: concerned  Speech: appropriate  Ability to Communicate: good  Concentration: good  Comprehension: good  Insight: fair  Judgement: fair  Decision Making Capacity: good  Impulse Control: fair  Intellectual Functioning: normal  Thought Process: intact  Thought Content: appropriate  Current Suicidal Ideation: no Plan:    Current Homicidal Ideation: no  Plan:     Pt's report of access to fire arms? None reported     ASSESSMENT    Pt here for medical complaints and  engaging in behaviors concerning to medical team, possibly exaggerating and manipulating her medical condition.     Pt reported to SW that her mother is intervening on her behalf to assist pt to return to [place] on Wed. Plan includes plane tickets for pt and her boyfriend and their two support dogs to get on a flight on Wed. Pt reports she wants to leave boyfriend's house - pt denies that boyfriend's  parents or any person, is keeping  her there against her will. Pt does report that she and boyfriend are "keeping it a secret" that they are planning to leave to return to [place], because parents would likely try and create barriers for boyfriend to leave. Pt does not anticipate that they would do any thing to keep her from leaving.    Pt denies she is being harmed, denies that she has intention to harm herself.  Pt does not have intention to harm any person.   Pt is not delusional, pt has no signs of a thought disorder, pt is not confused or disorganized, therefore pt is not "gravely disabled".    Pt demonstrated understanding that when the procedure is done to replace her g-tube she will be discharged to her own care.   Pt explained that she is in communication with her mother who is attempting to find a safe place for pt to stay in [facility] rather than return to boyfriend's house.      Pt does appear to be manipulative, is possibly malingering to avoid discharge.  Pt probably is in real distress with the living situation at her boyfriend's house. Pt appears to be  coping poorly by lying and possibly "falling" with pseudo seizures. These behaviors may indicate poor adjustment to her illness and/or living situation or could also possibly indicate patterns of behaviors found with personality disorder.     Pt has not demonstrated any indication that she wants to end her life and as stated, is not gravely disabled.  Pt does not meet criteria for 5150.    Willing to agree to plan for patient's safety:  yes go to a hotel or friend of mother's arranged by mother.   Adequate support system: yes, pt is in phone contact with her mother.     Patient meets criteria for 51/50: no     APS/CPS concerns?  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     Signature: Mercy Moore    Pager #  [phone]  Licensed Clinical Social Worker

## 2017-05-08 NOTE — ED Nursing Note (Signed)
Pt. To MRI

## 2017-05-08 NOTE — ED Nursing Note (Signed)
Pt ambulated to restroom without assist. Pt found moaning on hallway floor with G-J tube lying next to pt. Pt assisted up to standing and helped to gurney. Dr Faylene MillionVance made aware. Pt crying. Pt informed that she needs to place gown on. When reviewing pt items, pt was found to have vials of zofran 4 mg x1, ativan 2 mg per 2 ml x2, NS with needle and syringe. Found unidentified pink 9.5 ml med in syringe, [place] fluid in syringe. 2 mg dilaudid tablets x 35 tabs.     [employer] contacted.

## 2017-05-08 NOTE — ED Nursing Note (Signed)
pt ambulated to br.  steady gait

## 2017-05-08 NOTE — ED Nursing Note (Signed)
ir here to speak w pt re: replacement of g tube.  pt reports she will think about it. able to take meds po,

## 2017-05-08 NOTE — ED Progress Note (Signed)
Seen by PES.  Cleared for dc via Lyft.   Pt states she is "too scared to take Lyft"  She reports that her boyfriend can pick her up in the morning.    This patient was seen, evaluated, and care plan was developed with the resident.  I agree with the findings and plan as outlined in our combined note.       This note was electronically signed.       Valma CavaEMILY ANDRADA-BROWN, M.D., Attending  Health Sciences Clinical Professor  Division of Pediatric Emergency Medicine  Department of Emergency Medicine

## 2017-05-08 NOTE — ED Nursing Note (Signed)
Pt woke up. Ambulated to restroom with steady gait. resp even and unlabored.

## 2017-05-08 NOTE — ED Nursing Note (Signed)
[employer] at bedside took vials of medications (ativan x2, zofran, NS). Informed by [employer] to waste pink and  medication. All sharps/syringes placed in sharps container.

## 2017-05-08 NOTE — ED Nursing Note (Signed)
Neurology MDs at bedside.

## 2017-05-08 NOTE — ED Nursing Note (Signed)
Pt. C/o back pain at 8/10 and asking for pain meds. Medicated per MAR PRN. Mont/tx cont.

## 2017-05-08 NOTE — Consults (Addendum)
ACUTE CARE SURGERY CONSULT  Date of ED Admission: 05/07/2017  5:12 PM  Consultation Exam: 13:55    Attending: Dr. Jeanella Craze    Requesting Physician or Service: Internal Medicine       REASON FOR CONSULTATION: J-tube dislodged    HISTORY OF PRESENT ILLNESS     21yr female with history of gastroparesis and SMA syndrome, which were diagnosed in [place] (no records of how this was diagnosed). She had a laparoscopic J-tube placed at [facility] on 04/12/17. Per the patient, she does not tolerate more than a few sips of fluids on a daily basis and receives J-tube feedings continuously. The patient was transferred to the ED for "seizure like activity." She was evaluated by Neurology, who felt the spells were not epileptic and patient could be safely discharged. Patient did not want to leave (felt "too scared" to take a Lyft) and reportedly went to the bathroom alone, where she pulled out the J-tube. When asked about the event, she states that she "blacked out" and doesn't know what happened. IR was consulted by Medicine for J-tube replacement, but IR was unable to cannulate the prior tract. Of note, the patient was seen a few weeks prior by ACS after her "hamster bit the J-tube" and was found to have a tear in the J-tube. IR was asked to troubleshoot given its recent placement (MRN [id number]).    Review of Systems:   Constitutional:+fatigue  HEENT: No vision changes.  CV: No chest pain or palpitations.   Pulm: No shortness of breath, wheezing or stridor. 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 Hebert Soho)    Social History:   Tobacco (-)   EtOH (-)  Drugs (-)  Social History     Social History    Marital status: SINGLE     Spouse name: N/A    Number of children: N/A    Years of education: N/A     Social History Main Topics    Smoking status: Never Smoker    Smokeless tobacco: None    Alcohol use None    Drug use: None    Sexual activity: Not Asked     Other Topics Concern    None     Social History Narrative    None       Family History:   No family history on file.    Outpatient Medications:  No current facility-administered medications on file prior to encounter.      No current outpatient prescriptions on file prior to encounter.       Inpatient Medications:  FamoTIDine (PEPCID) Tablet 20 mg, ORAL, BID  Fluoxetine (PROZAC) 20 mg/5 mL Solution 60 mg, ORAL, QAM  LevETIRAcetam (KEPPRA) 100 mg/mL Solution 750 mg, ORAL, BID  Montelukast (SINGULAIR) Tablet 10 mg, ORAL, Daily 2100  Pregabalin (LYRICA) Capsule 75 mg, ORAL, TID  Valproate Sodium (DEPAKENE) 250 mg/5 mL Solution 250 mg, ORAL, BID  NaCl 0.9%, , IV, CONTINUOUS, Last Rate: 75 mL/hr at 05/08/17 1630      Acetaminophen (TYLENOL) Tablet 650 mg, ORAL, Q4H PRN  Hydromorphone (DILAUDID) Tablet 2 mg, ORAL, Q4H PRN  Promethazine (PHENERGAN) 6.25 mg/5 mL Syrup 12.5 mg, ORAL, Q4H PRN        Allergies  No Known Allergies    VITAL SIGNS   Temp: 36.7 C (98 F)  BP: 91/59  Pulse: 61  Resp: 14  SpO2: 98 %      Weight: 43.5 kg (96 lb) Body mass index is 18.75 kg/(m^2).    PHYSICAL EXAM  Gen: alert, oriented, appears comfortable, thin  Eyes: tracks movement appropriately, anicteric sclera  CV: normal rate, regular rhythm  Pulm: breathing comfortably on room air, symmetrical chest rise  GI: Abdomen soft, non-tender, non-distended, partially epithelialized site of prior J-tube without surrounding erythema, healed  laparoscopic incisions  Neuro: CN 2-12 grossly intact, no focal deficits  MSK: moving all extremities, gross symmetrical musculature    LAB TESTS  Recent labs for the past 72 hours     05/07/17 1724    WHITE BLOOD CELL COUNT 7.0    HEMOGLOBIN 11.8*    HEMATOCRIT 33.7*    PLATELET COUNT 237    APTT --    INR --     Recent labs for the past 72 hours     05/07/17 1724    SODIUM 137    POTASSIUM 3.6    CHLORIDE 104    CARBON DIOXIDE TOTAL 22*    UREA NITROGEN, BLOOD (BUN) 5*    CREATININE BLOOD 0.59    GLUCOSE 82    CALCIUM 8.9    MAGNESIUM (MG) --    PHOSPHORUS (PO4) --     POC Glucose, blood:  [83 mg/dl]    HEPATIC FUNCTION PANEL   Recent labs for the past 72 hours     05/07/17 1724    PROTEIN 6.3    ALBUMIN 3.5    BILIRUBIN TOTAL 0.4    ALKALINE PHOSPHATASE (ALP) 63    ASPARTATE TRANSAMINASE (AST) 22    ALANINE TRANSFERASE (ALT) 17    BILIRUBIN DIRECT 0.1        IMAGING STUDIES  None    ASSESSMENT  21 year old female with reported history of SMA syndrome and gastroparesis who removed her jejunostomy tube.    RECOMMENDATION  -No urgent indication for J-tube placement. Patient has demonstrated an inability to care for the J-tube with multiple complications within 1 month of placement; unclear if psychosocial issues are playing a role in this. A discussion should be had with her primary provider regarding the most optimal form of nutrition for her going forward, as recurrent surgeries for J-tube placement has significant chance of morbidity. If a J-tube is desired in the future, can follow-up with Dr. [facility]ayer at [facility] (placed prior J tube) or a referral can be made to General Surgery  -Recommend nasoenteric tube placement for nutrition. If not amenable, can consider TPN    Staffed with on call Acute Care Surgery attending Dr. Jeanella CrazePierce, who agrees in principle with resident assessment and plan.    Thank you for the opportunity to participate in this patient's care.    Electronically signed at 16:33 on  05/08/2017 by:  Meryl DareLauren Coleman, MD  General Surgery Resident, PGY3  Acute Care Surgery Consult Pager: [phone]) [phone]  Acute Care Surgery Service Pager: [phone]        ACUTE CARE SURGERY - SURGICAL CONSULT  ATTENDING NOTE  Date of Service: 05/08/2017    I personally examined the patient and reviewed the pertinent medical history, laboratory results and radiologic images.  The care plan was developed with the SURGERY CONSULT RESIDENT.  I agree with the assessment and plan as outlined in the resident's note.      Assessment:  Removed J-tube.  IR unable to recannulate tract.  Pt reports she is leaving for [facility] on Wednesday.    Plan:  2 distinct patient caused J-tube complications requiring assessment and intervention in first 4 weeks.  Replacement of J-tube with Wednesday deadline not reasonable.  Recommend alternative nutrition support for short term.  Serious discussion regarding replacement of J-tube before undertaking.    Report electronically signed by Rowe Robert, MD. Attending   Pager [phone]

## 2017-05-09 ENCOUNTER — Inpatient Hospital Stay (HOSPITAL_COMMUNITY): Payer: Medicaid Other

## 2017-05-09 DIAGNOSIS — K5900 Constipation, unspecified: Secondary | ICD-10-CM

## 2017-05-09 DIAGNOSIS — Z431 Encounter for attention to gastrostomy: Secondary | ICD-10-CM

## 2017-05-09 DIAGNOSIS — Z4682 Encounter for fitting and adjustment of non-vascular catheter: Secondary | ICD-10-CM

## 2017-05-09 LAB — CBC WITH DIFFERENTIAL
BASOPHILS % AUTO: 0.9 %
BASOPHILS ABS AUTO: 0 10*3/uL (ref 0.0–0.2)
EOSINOPHIL % AUTO: 6.3 %
EOSINOPHIL ABS AUTO: 0.3 10*3/uL (ref 0.0–0.5)
HEMATOCRIT: 35.3 % — AB (ref 36.0–46.0)
HEMOGLOBIN: 11.7 g/dL — AB (ref 12.0–16.0)
LYMPHOCYTE ABS AUTO: 1.8 10*3/uL (ref 1.2–5.2)
LYMPHOCYTES % AUTO: 44.9 %
MCH: 31.7 pg (ref 27.0–33.0)
MCHC: 33.2 % (ref 32.0–36.0)
MCV: 95.5 UM3 (ref 80.0–100.0)
MONOCYTES % AUTO: 8.1 %
MPV: 7.3 UM3 (ref 6.8–10.0)
Monocytes Abs Auto: 0.3 10*3/uL (ref 0.1–0.8)
NEUTROPHIL ABS AUTO: 1.6 10*3/uL — AB (ref 1.8–8.0)
NEUTROPHILS % AUTO: 39.8 %
PLATELET COUNT: 212 10*3/uL (ref 130–400)
RDW: 16.1 % — AB (ref 0.0–14.7)
RED CELL COUNT: 3.7 10*6/uL — AB (ref 4.10–5.10)
WHITE BLOOD CELL COUNT: 4.1 10*3/uL — AB (ref 4.5–13.0)

## 2017-05-09 LAB — BASIC METABOLIC PANEL
CALCIUM: 9.2 mg/dL (ref 8.6–10.5)
CARBON DIOXIDE TOTAL: 23 mmol/L — AB (ref 24–32)
CREATININE BLOOD: 0.67 mg/dL (ref 0.44–1.27)
Chloride: 105 mmol/L (ref 95–110)
GLUCOSE: 88 mg/dL (ref 70–99)
POTASSIUM: 3.7 mmol/L (ref 3.3–5.0)
SODIUM: 137 mmol/L (ref 135–145)
UREA NITROGEN, BLOOD (BUN): 3 mg/dL — AB (ref 8–22)

## 2017-05-09 MED ORDER — PROMETHAZINE 25 MG RECTAL SUPPOSITORY
12.5000 mg | Freq: Once | RECTAL | Status: DC
Start: 2017-05-09 — End: 2017-05-09

## 2017-05-09 MED ORDER — PROMETHAZINE 25 MG/ML INJECTION SOLUTION
12.5000 mg | Freq: Four times a day (QID) | INTRAMUSCULAR | Status: DC | PRN
Start: 2017-05-09 — End: 2017-05-10
  Administered 2017-05-10: 12.5 mg via INTRAVENOUS
  Filled 2017-05-09 (×2): qty 1

## 2017-05-09 MED ORDER — D5 / 0.2% NACL IV INFUSION
INTRAVENOUS | Status: DC
Start: 2017-05-09 — End: 2017-05-09
  Administered 2017-05-09: 75 mL via INTRAVENOUS

## 2017-05-09 MED ORDER — DIPHENHYDRAMINE 12.5 MG/5 ML ORAL LIQUID
25.0000 mg | Freq: Four times a day (QID) | ORAL | Status: DC | PRN
Start: 2017-05-09 — End: 2017-05-10

## 2017-05-09 MED ORDER — PROMETHAZINE 25 MG RECTAL SUPPOSITORY
12.5000 mg | RECTAL | Status: DC | PRN
Start: 2017-05-09 — End: 2017-05-10

## 2017-05-09 NOTE — ED Nursing Note (Signed)
Tube feeding started at 4815ml/hr. Pt resting on gurney with eyes closed. No signs of discomfort. Respirations even and unlabored. Will continue to monitor.

## 2017-05-09 NOTE — Progress Notes (Signed)
[id number] MEDICINE DAILY [facility]GRESS NOTE    Patient: Chloe Barron 9Ct [age]yr female Date of Admission: 05/07/2017   MRN: [id number] Length of Stay: . LOS: 1 day    Room: ED Room B9/B09 Date of Service: 05/09/2017     ID: [id number] woman with history of recurrent pseudoseizures, diamond-blackfan anemia s/p BMT c/b chronic GVHD, SMA syndrome, gastroparesis s/p J-tube, severe malnutrition, POTS, and PTSD who presented with a chief complaint of seizure and J-tube removal.    24 EVENTS/SUBJECTIVE:  --Evaluated by ACS and no urgent indication for J-tube replacement  --No pseudoseizures overnight   --Naso-enteric tube, post pyloric placed  --No acute events     OBJECTIVE:  Scheduled Medications:    Current Facility-Administered Medications:  Erythromycin (ERY-TAB) Delayed Release Tablet 250 mg ORAL TIDAC   FamoTIDine (PEPCID) Tablet 20 mg ORAL BID   Fluoxetine (PROZAC) 20 mg/5 mL Solution 60 mg ORAL QAM   LevETIRAcetam (KEPPRA) 100 mg/mL Solution 750 mg ORAL BID   Montelukast (SINGULAIR) Tablet 10 mg ORAL Daily 2100   Pregabalin (LYRICA) Capsule 75 mg ORAL TID   Valproate Sodium (DEPAKENE) 250 mg/5 mL Solution 250 mg ORAL BID     Continuous Medications:     PRN Medications:  Acetaminophen (TYLENOL) Tablet 650 mg, ORAL, Q4H PRN  Hydromorphone (DILAUDID) Tablet 2 mg, ORAL, Q4H PRN  Ondansetron (ZOFRAN) Injection 4 mg, IV, Q12H PRN  Promethazine (PHENERGAN) 6.25 mg/5 mL Syrup 12.5 mg, ORAL, Q4H PRN    Intake and Output  Last Two Completed Shifts  In: 1000 [Crystalloid:1000]  Out: -     Current Shift  In: -   Out: 400 [Urine:400]    PHYSICAL EXAM  CURRENT VITALS      MIN - MAX  BP: 90/64      BP: (82-101)/(52-68)   Pulse: 69      Pulse Min: 61 Max: 80  Resp: 16      Resp Min: 16 Max: 18  SpO2: 95 %      SpO2 Min: 95 % Max: 100 %  Temp: 36.6 C (97.9 F)      Temp Min: 36.6 C (97.9 F) Max: 37.1 C (98.8 F)  Weight: 43.5 kg (96 lb)  SpO2: 95 %  Flow (L/min): 15  Pulse: 69    General: Young woman, lying in bed, no acute  distress  HEENT: EOMI, mmm, op clear  Cardiac: RRR, no m/r/g  Lungs: CTAB  Abd: J-tube site noted, +bs, soft, non-tender, no rebound or guarding  Ext: No c/c/e, warm and well perfused     Lines & Drains: PIV; NG tube     The following imaging and laboratory studies were independently reviewed by me:    CBC  Recent labs for the past 72 hours     05/09/17 0639 05/07/17 1724    WHITE BLOOD CELL COUNT 4.1* 7.0    HEMOGLOBIN 11.7* 11.8*    HEMATOCRIT 35.3* 33.7*    MCV 95.5 93.3    PLATELET COUNT 212 237   CHEMISTRY  Recent labs for the past 72 hours     05/09/17 0639 05/08/17 1605 05/07/17 1724    SODIUM 137 -- 137    POTASSIUM 3.7 -- 3.6    CHLORIDE 105 -- 104    CARBON DIOXIDE TOTAL 23* -- 22*    CREATININE BLOOD 0.67 -- 0.59    UREA NITROGEN, BLOOD (BUN) 3* -- 5*    GLUCOSE 88 -- 82    CALCIUM 9.2 --  8.9    PHOSPHORUS (PO4) -- 4.1 --    MAGNESIUM (MG) -- 2.0 2.0      IMAGING  KUB: Weighted enteric feeding tube with tip projecting over the junction  of the second and third portion of the duodenum.    ASSESSMENT AND PLAN     # Gastroparesis with J-Tube s/p removal  J-tube placed on 8/15 at [facility] center with Dr. Hebert Soho. Replaced on 8/28 after had J-tube leak after animal bite (pet hamster) and exchanged on 8/28 after it was clogged. Patient was medically cleared and set for discharge from ER but J-tube fell out. Per ER team, concern that patient intentionally pulled it out. Unable to replace with IR as percutaneous tract to jejunum closed. No urgent surgical indication for replacement per ACS. Naso-enteric tube placed post pylorus and will start tube feeds.   --Erythromycin 250 mg daily   --Start tube feeds at 15 ml/hr and advance as tolerated q 4-6 hours w/ 40mL water flush   --F/u with dietician consult  --IV Zofran prn     # Pseudoseizure  Presented with seizure like activity to OSH and transferred to [place]. 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   --Appreciate neurology assistance   --Continue Keppra + Depakote  --Seizure precautions     # POTS  History of POTS with reports of positive tilt table test in the past. Noted by nurse to have a fall earlier in the day.   --Compression stockings  --Continue to monitor     # Severe protein calorie malnutrition: has unintentional weight loss over past 1 year.    # Depression  --Continue Prozac 60 mg daily     # Asthma. No e/o exacerbation  --Cont singulair    # FEN: Start tube feeds and advance as tolerated with free water flush   # DVT Prophylaxis:  SCD  # GI Prophylaxis:  PPI  # Code Status: Full   # Dispo: Pending        Report electronically signed by:  Eugenia Mcalpine, MD   Hospitalist Division  Pager: [phone]  PI: [phone]

## 2017-05-09 NOTE — ED Nursing Note (Signed)
Assumed care of patient. Received report from Carley HammedEva, [place]. Patient presents awake and alert with GCS 15. No acute distress.

## 2017-05-09 NOTE — ED Nursing Note (Signed)
pt reports tylenolnot helping and wants to try dilaudid

## 2017-05-09 NOTE — Discharge Planning (AHS/AVS) (Signed)
If you have any questions or concerns related to the services listed above that were coordinated for your discharge, please contact [facility] Clinical Case Management Department at [phone].

## 2017-05-09 NOTE — ED Nursing Note (Signed)
Report to Chris RN.

## 2017-05-09 NOTE — ED Nursing Note (Signed)
pt oob to commode. voided qs

## 2017-05-09 NOTE — ED Nursing Note (Signed)
Assumed care of pt, report from Alex RN.

## 2017-05-09 NOTE — ED Nursing Note (Signed)
pt reports  her fiance is bringing her 2 service dogs tomorrow to fly w her  . talking on the phone.  c/o abd pain but very easily distractable. abd soft bandaid over  g tube site

## 2017-05-09 NOTE — ED Nursing Note (Signed)
pt c/o nausea.  phenergan via dobhoff, OOB to commodevoided clear yellow

## 2017-05-09 NOTE — ED Nursing Note (Signed)
iv infiltrated.  pt  states that i should call the Dr. because she needs a central line.

## 2017-05-09 NOTE — ED Nursing Note (Signed)
admit Dr. @ bs. pt telling admit Dr. that she needs a Dr. note for referrals to GI, note for consults,  tube feeds, and she recommends to Dr. to write a note for a picc line and tpn. yesterday pt  stated she [place] all [place], reminded pt of this, pt now remembers  this conversation.  plan to get xray  to check placement of dobhoff.

## 2017-05-09 NOTE — ED Nursing Note (Signed)
Pt. Back to ED from MRI w/o issue. Mont/tx cont.

## 2017-05-09 NOTE — ED Nursing Note (Signed)
Assumed care, placed on monitor, nad

## 2017-05-09 NOTE — ED Nursing Note (Signed)
pt tolerating po juice w meds, able to swallow .  pt reports getting a good sleep .

## 2017-05-09 NOTE — ED Nursing Note (Signed)
Report to Alex, RN. Care transferred.

## 2017-05-09 NOTE — Clinical Case Management (Addendum)
Clinical Case Management Assessments    Name: Chloe Barron 9CT  MRN: [id number]   Date of Birth: 01/14/1996 (89yr) Gender: female    Note Date: 05/09/2017 Note Time: 11:54       INITIAL ASSESSMENT NOTE    Permanent Address[place] [address]  [place] [id number]  Discharge Address: [address]  [place] [id number]            Patient can follow-up with:   PCP: No Pcp Per Patient  / Phone Number: None  Preferred Pharmacy: [facility]    Funding/Billing: Payor: MEDI-CAL / Plan: MEDI-CAL HPE / Product Type: *No Product type* /             Comments:   Chart reviewed, pt admitted for gastroparesis w J tube dependence  Per SW note, pt lives at home with her BF and has plans to go back home [place] that her mother will purchase for her.     Patient Disposition is to discharge on her own care once medically cleared by MD.     CM to continue to follow for any discharge needs that may arise     @Adden  1600  Received call from Dr. Dorna Bloomhew whom stated [facility] and plan is to leave the NG tube in and discharge the patient tomorrow so the patient can catch her flight to go back [place] she is from.   CM escalated case to CCM leadership team to see if this plan is safe   CM to continue to follow     Date/Time: 05/09/2017 11:54  Electronically Signed by:  Elby BeckMimi Yashua Bracco, RN, MS-L, PHN  Clinical Case Management Department  [facility]  Pager: [phone]

## 2017-05-09 NOTE — ED Nursing Note (Signed)
Report given Richard, RN

## 2017-05-09 NOTE — ED Nursing Note (Signed)
Report received/care assumed from Ophelia Charterichard P RN. NAD. Waiting admit bed assignment. Family @ bedside. Cont to monitor
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
## 2017-05-10 ENCOUNTER — Emergency Department
Admission: EM | Admit: 2017-05-10 | Discharge: 2017-05-11 | Disposition: A | Discharge status: Home / Self Care | Payer: Medicaid Other | Attending: Emergency Medicine | Admitting: Emergency Medicine

## 2017-05-10 ENCOUNTER — Other Ambulatory Visit: Payer: Self-pay

## 2017-05-10 ENCOUNTER — Emergency Department (EMERGENCY_DEPARTMENT_HOSPITAL): Payer: Medicaid Other

## 2017-05-10 DIAGNOSIS — F4310 Post-traumatic stress disorder, unspecified: Secondary | ICD-10-CM | POA: Insufficient documentation

## 2017-05-10 DIAGNOSIS — F32A Depression, unspecified: Secondary | ICD-10-CM

## 2017-05-10 DIAGNOSIS — F603 Borderline personality disorder: Secondary | ICD-10-CM | POA: Insufficient documentation

## 2017-05-10 DIAGNOSIS — F329 Major depressive disorder, single episode, unspecified: Secondary | ICD-10-CM | POA: Insufficient documentation

## 2017-05-10 DIAGNOSIS — R569 Unspecified convulsions: Secondary | ICD-10-CM

## 2017-05-10 DIAGNOSIS — G40909 Epilepsy, unspecified, not intractable, without status epilepticus: Principal | ICD-10-CM | POA: Insufficient documentation

## 2017-05-10 LAB — CBC WITH DIFFERENTIAL
BASOPHILS % AUTO: 0.8 %
BASOPHILS % AUTO: 0.4 %
BASOPHILS ABS AUTO: 0 10*3/uL (ref 0.0–0.2)
BASOPHILS ABS AUTO: 0 10*3/uL (ref 0.0–0.2)
EOSINOPHIL % AUTO: 9.6 %
EOSINOPHIL % AUTO: 3.7 %
EOSINOPHIL ABS AUTO: 0.2 10*3/uL (ref 0.0–0.5)
Eosinophils Abs Auto: 0.4 10*3/uL (ref 0.0–0.5)
HEMATOCRIT: 34.7 % — AB (ref 36.0–46.0)
HEMATOCRIT: 35.4 % — AB (ref 36.0–46.0)
HEMOGLOBIN: 12.1 g/dL (ref 12.0–16.0)
HEMOGLOBIN: 12.6 g/dL (ref 12.0–16.0)
LYMPHOCYTE ABS AUTO: 1.6 10*3/uL (ref 1.2–5.2)
LYMPHOCYTES % AUTO: 42.1 %
LYMPHOCYTES % AUTO: 18.3 %
Lymphocytes Abs Auto: 1.1 10*3/uL — ABNORMAL LOW (ref 1.2–5.2)
MCH: 32.6 pg (ref 27.0–33.0)
MCH: 33.1 pg — AB (ref 27.0–33.0)
MCHC: 34.8 % (ref 32.0–36.0)
MCHC: 35.5 % (ref 32.0–36.0)
MCV: 93.6 UM3 (ref 80.0–100.0)
MCV: 93.2 UM3 (ref 80.0–100.0)
MONOCYTES % AUTO: 7.6 %
MONOCYTES % AUTO: 7.7 %
MONOCYTES ABS AUTO: 0.3 10*3/uL (ref 0.1–0.8)
MONOCYTES ABS AUTO: 0.5 10*3/uL (ref 0.1–0.8)
MPV: 7.1 UM3 (ref 6.8–10.0)
MPV: 7.7 UM3 (ref 6.8–10.0)
NEUTROPHIL ABS AUTO: 1.5 10*3/uL — AB (ref 1.8–8.0)
NEUTROPHIL ABS AUTO: 4.2 10*3/uL (ref 1.8–8.0)
NEUTROPHILS % AUTO: 39.9 %
NEUTROPHILS % AUTO: 69.9 %
PLATELET COUNT: 236 10*3/uL (ref 130–400)
PLATELET COUNT: 259 10*3/uL (ref 130–400)
RDW: 16 % — AB (ref 0.0–14.7)
RDW: 15.5 % — AB (ref 0.0–14.7)
RED CELL COUNT: 3.71 10*6/uL — AB (ref 4.10–5.10)
RED CELL COUNT: 3.8 10*6/uL — AB (ref 4.10–5.10)
WHITE BLOOD CELL COUNT: 3.9 10*3/uL — AB (ref 4.5–13.0)
WHITE BLOOD CELL COUNT: 6.1 10*3/uL (ref 4.5–13.0)

## 2017-05-10 LAB — UR DRUGS OF ABUSE SCREEN
AMPHETAMINE SCREEN, URINE: NEGATIVE ng/mL
BARBITURATES SCREEN, URINE: NEGATIVE
BENZODIAZEPINES SCREEN, URINE: POSITIVE — AB
COCAINE METABOLITE SCRN, URINE: NEGATIVE
OPIATES SCREEN, URINE: POSITIVE — AB

## 2017-05-10 LAB — BASIC METABOLIC PANEL
CALCIUM: 8.9 mg/dL (ref 8.6–10.5)
CALCIUM: 9.3 mg/dL (ref 8.6–10.5)
CARBON DIOXIDE TOTAL: 28 mmol/L (ref 24–32)
CARBON DIOXIDE TOTAL: 25 mmol/L (ref 24–32)
CHLORIDE: 103 mmol/L (ref 95–110)
CHLORIDE: 102 mmol/L (ref 95–110)
CREATININE BLOOD: 0.64 mg/dL (ref 0.44–1.27)
CREATININE BLOOD: 0.73 mg/dL (ref 0.44–1.27)
GLUCOSE: 84 mg/dL (ref 70–99)
GLUCOSE: 82 mg/dL (ref 70–99)
POTASSIUM: 3.7 mmol/L (ref 3.3–5.0)
POTASSIUM: 3.8 mmol/L (ref 3.3–5.0)
SODIUM: 137 mmol/L (ref 135–145)
SODIUM: 138 mmol/L (ref 135–145)
UREA NITROGEN, BLOOD (BUN): 3 mg/dL — AB (ref 8–22)
UREA NITROGEN, BLOOD (BUN): 7 mg/dL — AB (ref 8–22)

## 2017-05-10 LAB — BLD GAS VENOUS
BASE EXCESS, VEN: 5 meq/L — AB (ref <=2)
HCO3, VEN: 29 meq/L — AB (ref 20–28)
O2 Sat, Ven: 94 % (ref 70–100)
PCO2, VEN: 45 mmHg (ref 35–50)
PH, VEN: 7.43 — AB (ref 7.30–7.40)
PO2, VEN: 67 mmHg — AB (ref 30–55)

## 2017-05-10 LAB — URINALYSIS-COMPLETE
BILIRUBIN URINE: NEGATIVE
GLUCOSE URINE: NEGATIVE mg/dL
KETONES: NEGATIVE mg/dL
LEUK. ESTERASE: NEGATIVE
NITRITE URINE: NEGATIVE
OCCULT BLOOD URINE: NEGATIVE mg/dL
PH URINE: 7 (ref 4.8–7.8)
PROTEIN URINE: NEGATIVE mg/dL
RBC: 1 /HPF (ref 0–5)
SPECIFIC GRAVITY, URINE: 1.013 (ref 1.002–1.030)
SQUAMOUS EPI: 1 /HPF (ref 0–5)
UROBILINOGEN.: 2 mg/dL (ref ?–2.0)
WBC: 1 /HPF (ref 0–5)

## 2017-05-10 LAB — HEPATIC FUNCTION PANEL
ALANINE TRANSFERASE (ALT): 14 U/L (ref 5–54)
ALBUMIN: 3.8 g/dL (ref 3.4–4.8)
ALKALINE PHOSPHATASE (ALP): 74 U/L (ref 30–130)
Aspartate Transaminase (AST): 21 U/L (ref 15–43)
BILIRUBIN DIRECT: 0.1 mg/dL (ref 0.0–0.2)
BILIRUBIN TOTAL: 0.4 mg/dL (ref 0.3–1.3)
PROTEIN: 6.9 g/dL (ref 6.3–8.3)

## 2017-05-10 LAB — ACETAMINOPHEN

## 2017-05-10 LAB — ETHANOL, PLASMA: ETHANOL, PLASMA: NEGATIVE mg/dL

## 2017-05-10 LAB — LIPASE: LIPASE: 26 U/L (ref 13–51)

## 2017-05-10 LAB — SALICYLATE,BLOOD: SALICYLATE, BLOOD: NEGATIVE mg/dL

## 2017-05-10 MED ORDER — KETOROLAC 15 MG/ML INJECTION SOLUTION
15.0000 mg | Freq: Once | INTRAMUSCULAR | Status: AC
Start: 2017-05-10 — End: 2017-05-10
  Administered 2017-05-10: 15 mg via INTRAVENOUS
  Filled 2017-05-10: qty 1

## 2017-05-10 MED ORDER — PROMETHAZINE 6.25 MG/5 ML ORAL SYRUP
12.5000 mg | ORAL_SOLUTION | ORAL | 0 refills | Status: AC | PRN
Start: 2017-05-10 — End: 2017-05-17
  Filled 2017-05-10: qty 120, 2d supply, fill #0

## 2017-05-10 MED ORDER — D5 / 0.45% NACL IV INFUSION
INTRAVENOUS | Status: DC
Start: 2017-05-10 — End: 2017-05-11
  Administered 2017-05-10: 21:00:00 via INTRAVENOUS

## 2017-05-10 MED ORDER — PROMETHAZINE 25 MG/ML INJECTION SOLUTION
12.5000 mg | Freq: Once | INTRAMUSCULAR | Status: DC
Start: 2017-05-11 — End: 2017-05-11

## 2017-05-10 NOTE — Clinical Case Management (Signed)
Clinical Case Management Assessments    Name: Chloe Barron  MRN: [id number]   Date of Birth: March 03, 1996 (76yr Gender: female    Note Date: 05/10/2017 Note Time: 09:39         ONGOING DISCHARGE PLANNING NOTE    Current functional level of care    Anticipated Lines/Drains/Wounds: J tube        Patient/Family offered choice of provider and agreeable with plan/referrals? yes     Plan/Follow-up needed:    Case reviewed and discussed with Dr. CSarina Serand bedside RN,  Plan is for patient to discharge today in order to catch [place] [facility]     CM spoke with pt's mother via telephone MGeorges [phone] confirm that there is a flight. Melissa confirmed that she bought a flight for the pt, pt's BF Zane, and their 2 dogs. Melissa stated she does not know the flight number but pt will be flying with [employer] depart time 1700, [place] 0500 [place] aware that pt will need a lot of medical needs and she verbalized she understands and listed the services she plans to get the patient. She stated, she will get a GI MD, mental health, and plans to be her POA.      @Adden  1004  CM met w pt, pt's fiance, and pt's fiance's mother at bedside, pt is a/ox4  [place] [facility]   Transportation will be set up for 1400   CM updated Dr. CSarina Serand bedside RN          Date/Time: 05/10/2017 09:39  Electronically Signed by:   MDouglass Rivers RN, MS-L, PHN  Clinical Case Management Department  [facility] Pager: [phone]

## 2017-05-10 NOTE — ED Nursing Note (Signed)
Assumed care of pt for one hour break relief. Pt lying in gurney, awake, calm, in no distress.

## 2017-05-10 NOTE — Discharge Instructions (Signed)
Thank you for choosing [facility] for your health care needs. It has been our privilege to take care of you. You have been treated for pseudoseizure and discharged home. Please take all medicines that are prescribed to you as directed. It is crucial, to follow up with your primary care physician in the time frame recommended, as many health conditions that seem self-limited initially may actually worsen over time.     If you do not have a primary care physician, to find a primary care doctor that is part of [facility] and is accepting new patients, please call the referral center at [phone], or [phone]. You can also visit the website at [email or web address] to submit an online request.     Please realize that the results of some studies that you had done during your stay with [place] (such as x-rays, labs, cultures) can occassionally change after your have been discharged as more information becomes available or as the studies are reevaluated by other members of our health care team in the next few days. We will attempt to contact you with any important changes or additions to the studies that were obtained.    If at any time you feel that your condition is worsening, call your doctor, seek medical attention, or go to the emergency department for evaluation. This includes, but is not limited to, symptoms of fever, chills, sweats, chest pain, shortness of breath, abdominal pain, nausea/vomiting/diarrhea, severe constipation, or new numbness/tingling/weakness.    [facility] HOSPITAL STAY AND FOLLOW UP INSTRUCTIONS   A discharge summary will be sent to your primary care physician.   If for some reason your physician needs another copy of the discharge summary, your doctor may fax a request on official letterhead to the [facility] Medical Release of Information Unit at the following number [phone].     MY INFORMATION  I was admitted to the hospital on  05/07/2017   I was discharged from the hospital on 05/10/17   I was treated in the hospital for Active Hospital Problems     *Nonepileptic episode     Gastroparesis     Severe protein-calorie malnutrition    MY MEDICATIONS  I will take the following medications as instructed:    Patient's Medications   New Prescriptions    PROMETHAZINE (PHENERGAN) 6.25 MG/5 ML LIQUID    Take 10 mL by mouth every 4 hours if needed for nausea/vomiting.   Previous Medications    ACETAMINOPHEN (TYLENOL) 325 MG TABLET    325 mg every 6 hours if needed. Indications: Fever    CALCIUM CARBONATE (TUMS 500 PO)    750 mg 4 times daily if needed.    CETIRIZINE (ZYRTEC) 10 MG CAPSULE    10 mg every evening with a meal.    DIPHENHYDRAMINE (BENADRYL) 25 MG CAPSULE    Take 50 mg by mouth every 8 hours.    DIVALPROEX (DEPAKOTE) 500 MG DELAYED RELEASE TABLET    Take 500 mg by mouth 2 times daily.    DRONABINOL (MARINOL) 10 MG CAPSULE    10 mg 3 times daily.    ESOMEPRAZOLE (NEXIUM) 20 MG DELAYED RELEASE CAPSULE    Take 20 mg by mouth every morning before a meal.    ESTRADIOL BIWEEKLY 0.1 MG/24 HR PATCH    Apply 1 patch to the skin every Saturday.    FAMOTIDINE (PEPCID) 20 MG TABLET    20 mg 2 times daily.  FLUOXETINE 60 MG TABLET    60 mg every day at bedtime.    HYDROCORTISONE (CORTEF) 10 MG TABLET    15 mg every 8 hours if needed (fever/illness).    HYDROMORPHONE (DILAUDID) 2 MG TABLET    2 mg every 4 hours if needed.    LEVETIRACETAM (KEPPRA) 100 MG/ML LIQUID    7.5 mL 2 times daily.    LORAZEPAM (ATIVAN) 2 MG TABLET    Take 2 mg by mouth every 8 hours if needed.    MELATONIN 10 MG TABLET    every day at bedtime if needed.    METOCLOPRAMIDE (REGLAN) 10 MG TABLET    10 mg 4 times daily, before meals and at bedtime.    MONTELUKAST (SINGULAIR) 10 MG TABLET    10 mg every evening.    ONDANSETRON (ZOFRAN) 4 MG/5 ML SOLUTION    4 mg three times daily if needed.   Modified Medications    No medications on file   Discontinued Medications    No  medications on file     Recommended Follow Up Appointments:  your primary care provider    Schedule an appointment as soon as possible for a visit      EMERGENCY - PAVILION  [address]  [place]  [id number]  [phone]    As needed    No Pcp Per Patient    Schedule an appointment as soon as possible for a visit    Scheduled Appointments:  No future appointments.     Please increase your activity gradually as you will be somewhat weak after leaving the hospital.    Please avoid alcohol, tobacco, and illicit substances for your health.    MY MEDICATIONS    I will carry a list of current medications (prescriptions, and over the counter) and vitamins at all times. I will keep this list up to date and share with my health care providers. It is especially important in emergency situations that caregivers know what medications I am taking.   If I have questions about medications and prescriptions, I can ask my nurse, caregiver, or pharmacist.

## 2017-05-10 NOTE — ED Nursing Note (Signed)
c-collar cleared by Dr Filbert SchilderBarlow

## 2017-05-10 NOTE — ED Nursing Note (Signed)
Patient BIBA, found down at airport after reported seizure. Patient has psych and seizure history. [facility]. [place] from [place] and was trying to get home. Per EMS patient had what appeared to be several seizures in route however post ictal period was abnormally short and suspicious. Apparently patient has recorded psuedo seizures from previous visits. Patient also has had history of multiple medication bottles and viles upon inspection of her personal property. Patient placed on monitors, VSS, NAD, patient was initially not verbally responsive however within minutes began to answer questions appropriately. Full assessment completed and documented. MD at bedside. IV placed and labs sent.
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
## 2017-05-10 NOTE — ED Nursing Note (Signed)
Pt alert and oriented x4, calm, no c-collar in place, states she has pain to low back and BLE from fall during a seizure PTA

## 2017-05-10 NOTE — ED Nursing Note (Signed)
feeds inc to 30cc. pt sleeping

## 2017-05-10 NOTE — ED Nursing Note (Signed)
Report given to Shawna, RN, care transferred.

## 2017-05-10 NOTE — ED Nursing Note (Signed)
No changes from prior assessment no labored breathing no distress noted. Pt resting comfortably.

## 2017-05-10 NOTE — ED Initial Note (Signed)
EMERGENCY DEPARTMENT PHYSICIAN NOTE - Cherae Martenson       Date of Service:   05/10/2017  5:18 PM Patient's PCP: No Pcp Per Patient   Note Started: 05/10/2017 19:00 DOB: 12-26-1995         Chief Complaint   Patient presents with    Active Seizures       The history provided by the patient and medical records.  Interpreter used: No    Chloe Barron is a 21yr old female, who has a past medical history significant for pseudoseizures, gastroparesis, presenting to the ED with a chief complaint of seizures that began just prior to arrival.  Patient was at the airport when she reportedly had over 20 seizures.  EMS states that she would be "postictal" for around 1 minute and she would be able to answer questions appropriately.  It was reported that she awoken in the ambulance and asked for an IO and went back to seizing.  Was given 6 mg of Valium in route.  On arrival, patient was not answering questions.    Patient was discharged from hospitalist service this morning after being admitted for pseudoseizures and issue with her J-tube.  She was evaluated by neurology who deemed that her seizures were not real.  Also discussion of of accidental versus intentional removal of her J-tube.  While admitted, IR attempted to replace it but the tract was closed.  ACS evaluate the patient but deemed that she was not a candidate for surgery at this time.       A full history, including past medical, social, and family history (as detailed in this note), was reviewed and updated as necessary.      HISTORY:  Past Medical History   Diagnosis    Epilepsy    No Known Allergies   No past surgical history on file.   Current Outpatient Prescriptions:     Acetaminophen (TYLENOL) 325 mg Tablet, 325 mg every 6 hours if needed. Indications: Fever    calcium carbonate (TUMS 500 PO), 750 mg 4 times daily if needed.    Cetirizine (ZYRTEC) 10 mg Capsule, 10 mg every evening with a meal.    DiphenhydrAMINE (BENADRYL) 25 mg Capsule, Take 50 mg by mouth  every 8 hours.    Divalproex (DEPAKOTE) 500 mg Delayed Release Tablet, Take 500 mg by mouth 2 times daily.    Dronabinol (MARINOL) 10 mg Capsule, 10 mg 3 times daily.    Esomeprazole (NEXIUM) 20 mg Delayed Release Capsule, Take 20 mg by mouth every morning before a meal.    Estradiol Biweekly 0.1 mg/24 hr Patch, Apply 1 patch to the skin every Saturday.    FamoTIDine (PEPCID) 20 mg Tablet, 20 mg 2 times daily.    FLUoxetine 60 mg Tablet, 60 mg every day at bedtime.    Hydrocortisone (CORTEF) 10 mg Tablet, 15 mg every 8 hours if needed (fever/illness).    Hydromorphone (DILAUDID) 2 mg Tablet, 2 mg every 4 hours if needed.    LevETIRAcetam (KEPPRA) 100 mg/mL Liquid, 7.5 mL 2 times daily.    Lorazepam (ATIVAN) 2 mg Tablet, Take 2 mg by mouth every 8 hours if needed.    melatonin 10 mg Tablet, every day at bedtime if needed.    Metoclopramide (REGLAN) 10 mg Tablet, 10 mg 4 times daily, before meals and at bedtime.    Montelukast (SINGULAIR) 10 mg Tablet, 10 mg every evening.    Ondansetron (ZOFRAN) 4 mg/5 mL solution, 4 mg three times  daily if needed.    Promethazine (PHENERGAN) 6.25 mg/5 mL Liquid, Take 10 mL by mouth every 4 hours if needed for nausea/vomiting.   Social History    Marital status: SINGLE              Spouse name:                       Years of education:                 Number of children:               Occupational History    None on file    Social History Main Topics    Smoking status: Never Smoker                                                                   Smokeless status: Not on file                       Alcohol use: Not on file     Drug use: Not on file     Sexual activity: Not on file          Other Topics            Concern    None on file    Social History Narrative    None on file     No family history on file.        Review of Systems   Unable to perform ROS: Mental status change          TRIAGE VITAL SIGNS:  Temp: 37.2 C (99 F) (05/10/17 1730)  Temp src: Oral  (05/10/17 1730)  Pulse: 83 (05/10/17 1730)  BP: 99/69 (05/10/17 1730)  Resp: 13 (05/10/17 1730)  SpO2: 98 % (05/10/17 1730)  Weight: (not recorded)    Physical Exam   Constitutional: She appears well-developed. No distress.   Thin, malnourished   HENT:   Head: Normocephalic and atraumatic.   Right Ear: External ear normal.   Left Ear: External ear normal.   Nose: Nose normal.   Mouth/Throat: Oropharynx is clear and moist.   Eyes: Conjunctivae are normal. Pupils are equal, round, and reactive to light. Right eye exhibits no discharge. Left eye exhibits no discharge.   Pupils 5mm   Neck: Neck supple.   In c collar   Cardiovascular: Normal rate, regular rhythm, normal heart sounds and intact distal pulses.    No murmur heard.  Pulmonary/Chest: Effort normal and breath sounds normal. No respiratory distress. She has no wheezes. She has no rales.   Abdominal: Soft. Bowel sounds are normal. She exhibits no distension. There is no tenderness.   Old J tube scar   Musculoskeletal: Normal range of motion. She exhibits no edema or tenderness.   Neurological:   No responding to questions on arrival   Skin: Skin is warm and dry. She is not diaphoretic.   IO in LLE   Psychiatric: Her speech is normal. Thought content normal.   Nursing note and vitals reviewed.         INITIAL ASSESSMENT & PLAN, MEDICAL DECISION MAKING, ED COURSE  Chloe Barron is a 21yr female  who presents with a chief complaint of seizure.     Differential includes, but is not limited to: Seizure, pseudoseizure, gastroparesis, malingering     The results of the ED evaluation were notable for the following:    Pertinent lab results:   Labs Reviewed   UR DRUGS OF ABUSE SCREEN - Abnormal        Result Value    Barbiturates Screen, Urine NEGATIVE      Benzodiazepines Screen, Urine POSITIVE (*)     Cocaine Metabolite Scrn, Urine NEGATIVE      Opiates Screen, Urine POSITIVE (*)     Amphetamine Screen, Urine NEGATIVE     URINALYSIS-COMPLETE - Abnormal     COLLECTION  CLEAN CATCH      COLOR Yellow      CLARITY Sl Turbid      SPECIFIC GRAVITY, URINE 1.013      pH URINE 7.0      OCCULT BLOOD URINE Negative      BILIRUBIN URINE Negative      KETONES Negative      GLUCOSE URINE Negative      PROTEIN URINE Negative      UROBILINOGEN 2.0      NITRITE URINE Negative      LEUK ESTERASE Negative      MICROSCOPIC  Indicated      WBC, URINE 1      RBC 1      SQUAMOUS EPI 1      MUCOUS/LPF Moderate (*)     AMORPH CRYSTALS Present     CBC WITH DIFFERENTIAL - Abnormal     White Blood Cell Count 6.1      Red Blood Cell Count 3.80 (*)     Hemoglobin 12.6      Hematocrit 35.4 (*)     MCV 93.2      MCH 33.1 (*)     MCHC 35.5      RDW 15.5 (*)     MPV 7.7      Platelet Count 259      Neutrophils % Auto 69.9      Lymphocytes % Auto 18.3      Monocytes % Auto 7.7      Eosinophil % Auto 3.7      Basophils % Auto 0.4      Neutrophil Abs Auto 4.2      Lymphocyte Abs Auto 1.1 (*)     Monocytes Abs Auto 0.5      Eosinophil Abs Auto 0.2      Basophils Abs Auto 0.0     BASIC METABOLIC PANEL - Abnormal     Sodium 138      Potassium 3.8      Chloride 102      Carbon Dioxide Total 25      Urea Nitrogen, Blood (BUN) 7 (*)     Glucose 82      Calcium 9.3      Creatinine Serum 0.73      E-GFR, African American >60      E-GFR, Non-African American >60     BLD GAS VENOUS - Abnormal     PATIENT TEMP, VEN        PO2, VEN 67 (*)     O2 SAT, VEN 94      PCO2, VEN 45      pH, VEN 7.43 (*)     HCO3, VEN 29 (*)     BASE EXCESS, VEN 5 (*)  Narrative:     Specimen labeled as venous, however, results appear arterial or possible air contamination.   HEPATIC FUNCTION PANEL - Normal    Protein 6.9      Alkaline Phosphatase (ALP) 74      Aspartate Transaminase (AST) 21      Bilirubin Total 0.4      Alanine Transferase (ALT) 14      Bilirubin Direct 0.1      Albumin 3.8     LIPASE - Normal    Lipase 26     ACETAMINOPHEN - Normal    Acetaminophen <10     ETHANOL, PLASMA    ETHANOL, PLASMA  Negative     SALICYLATE,BLOOD     SALICYLATE,BLOOD Negative     POC PREGNANCY SPOT URINE (UPT)   POC GLUCOSE       Pertinent imaging results (reviewed and interpreted independently by me):   DX CHEST 1 VIEW - no fracture or consolidation    Radiology reads:   Dx Chest 1 View    Result Date: 05/10/2017  DX CHEST 1 VIEW EXAM DATE: 05/10/2017 6:36 PM COMPARISON: None. INDICATION: Signs/Symptoms:  seizure FINDINGS: Normal heart size. Low inspiratory lung volumes. No focal consolidation or pulmonary edema. No definite pleural effusion or visualized pneumothorax. The visualized upper abdomen is unremarkable. No acute osseous abnormality. IMPRESSION: 1. No acute cardiopulmonary disease. Preliminary Report Electronically Signed By: Tery Sanfilippo on 05/10/2017 6:47 PM I have personally reviewed the images of this study and agree with the above report. Final Report Electronically Signed By: Caro Laroche on 05/10/2017 6:48 PM      EKG (reviewed and interpreted independently by me): The rhythm is sinus, rate 80, axis normal, QTc 440, no ST/T changes, normal EKG, normal sinus rhythm, there are no previous tracings available for comparison.  Medications   D5 / 0.45% NaCl Infusion ( IV New bag/syringe 05/10/17 2125)   Promethazine (PHENERGAN) Injection 12.5 mg (not administered)   Ketorolac (TORADOL) Injection 15 mg (15 mg IV Given 05/10/17 2008)         Chart Review: I reviewed the patient's prior medical records. Pertinent information that is relevant to this encounter prior admission from this month, where she was discharged today.  Was diagnosed with pseudoseizures at that time.  Also was evaluated for J-tube replacement by IR but they were unable to place it given closure of the tract.  ACS also evaluated the patient and felt that she was not appropriate At this time given question of her removing her J-tube.      PATIENT SUMMARY  21 year old female recently diagnosed with pseudoseizures by our neurology department who presented with over 20 seizures just prior to  arrival.  Initial vitals unremarkable.  EMS gave her 6 mg of Versed in route.  Upon arrival, patient not answering any questions.  Exam negative for any trauma, pupils 5 mm equal round and reactive.  Lungs clear and heart rate regular.  Has scar on abdomen from prior J-tube.  EKG unremarkable with normal intervals.  Labs significant for normal electrolytes, no leukocytosis or anemia, negative Tylenol, salicylate and ethanol.  Urine drug screen positive for opiates and benzodiazepines.  Urine negative for infection.  Chest x-ray unremarkable.      On reevaluation, patient AxO x4 but states that she does not remember what happened at the airport.  Able to clear her C-spine at this time.  Patient given Toradol for her pain and started on D5/0.45%NS.  Discussed with hospitalist  about admission since patient was just discharged this morning.  States that she does not meet criteria for admission at this time.  Made plan to order her a regular diet and encourage her to attempt p.o. while in the ED.  Plan to consult psychiatry given question of Munchhausen syndrome and will get discharge planning involved in the morning.  Patient is medically clear at this time.      LAST VITAL SIGNS:  Temp: 37.2 C (99 F) (05/10/17 1730)  Temp src: Oral (05/10/17 1730)  Pulse: 73 (05/10/17 2000)  BP: 97/67 (05/10/17 2000)  Resp: 18 (05/10/17 2000)  SpO2: 97 % (05/10/17 2000)  Weight: (not recorded)      Clinical Impression: Pseudoseizures.  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      Disposition: Pending      PATIENT'S GENERAL CONDITION:  Fair: Vital signs are stable and within normal limits. Patient is conscious but may be uncomfortable. Indicators are favorable.      Electronically signed by: Verdell Carmine, MD, Resident        This patient was seen, evaluated, and care plan was developed with the resident.  I agree with the findings and plan as outlined in our combined note. I personally independently visualized the images and tracings as noted  above.      Oneita Jolly, MD      Electronically signed by: Oneita Jolly, MD, Attending Physician

## 2017-05-10 NOTE — ED Nursing Note (Signed)
DC paper ork received from admit team. Pt DC pending per inpatient DC-planner. NAD. PIV and feeding tube discontinued. Pt fiance @ bedside with 2 dogs/dispo pending/cont to monitor

## 2017-05-10 NOTE — ED Nursing Note (Signed)
Assumed pt care for one hour lunch relief.

## 2017-05-10 NOTE — Clinical Case Management (Addendum)
Clinical Case Management Assessments    Name: Sena SlateSKYLA Stripling 9CT  MRN: [id number]   Date of Birth: 04/17/1996 ([age]yr) Gender: female    Note Date: 05/10/2017 Note Time: 10:07       FINAL DISPOSITION NOTE    Permanent Address[place] [address]  [place] [id number]    [facility]     Flying with [employer]     Depart: 1500 SMF / Arrival: 0500 CLT                    Patient/patient representative, informed of discharge disposition: yes  Patient/family offered choice of provider and agreeable with plan/referrals? yes        Funding/Billing: Payor: MEDI-CAL / Plan: MEDI-CAL HPE / Product Type: *No Product type* /      Patient can follow-up with:   PCP: No Pcp Per Patient  /  Phone Number: None  Preferred Pharmacy:  [facility]     Transportation:     Lyft for  Patient, pt's fiance Zane, and 2 service dogs     Pick up time 1400          Comments:   CM updated Jennalyn from discharge team to set up transportation for Lyft for 2pm for fiance and 2 dogs.     @Adden  1341  CM spoke with Aram Beechamynthia from discharge team whom stated the charge nurse in ED sets up Lyft for the patient. Aram Beechamynthia updated Angie- charge nurse for ED  CM updated Debbie Retail bankerbedside RN.       Date/Time:05/10/2017 10:07  Electronically Signed by:   Elby BeckMimi Tyresha Fede, RN, MS-L, PHN  Clinical Case Management Department  [facility]  Pager: [phone]

## 2017-05-10 NOTE — ED Nursing Note (Signed)
Did not increase feeding due to mild abd discomfort. Pt requesting phenergan.

## 2017-05-10 NOTE — Nurse Assessment (Signed)
[facility]. Pt sleeping, tube feed increased, will assess tube and flush free water with meds this AM.

## 2017-05-10 NOTE — ED Nursing Note (Signed)
Inpatient DC planner paged  Regarding pt discharge plan/status. Cont to monitor

## 2017-05-10 NOTE — ED Nursing Note (Signed)
report to Gilda Creasehris Rowe. pt sitting on bed tolerating tube feeds @ 50cc/hr. 2 "service dogs " @ bs. fiance brought suitcase, pt has it open on the bed and suitcase is filled w bottles of pills and liquids. approx 100-200 bottles of medicine.
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
## 2017-05-10 NOTE — ED Nursing Note (Signed)
Pt discharged. NAD. A+Ox4/GCS 15. Pt DC'd to waiting room to aaint transport home.

## 2017-05-10 NOTE — ED Nursing Note (Signed)
During IV placement, pt's body became stiff and fists clenched. Lasted approx 30 seconds. HR in 80s during this event. Pt immediately responsive and oriented after event. Pt tearfully verbalizing "I'm sorry, I just wanted to go home"

## 2017-05-10 NOTE — Discharge Instructions (Signed)
Thank you for choosing [facility] for your emergency health care needs. It has been our privilege to take care of you today. You have been treated for seizure and discharged home. Please take all medicines that are prescribed to you as directed. It is crucial, if you have a primary care physician, to follow up with him or her in the time frame recommended as many health conditions that seem self-limited initially may actually worsen over time. If you do not have a primary care physician, we will outline the various resources available for you to find one.     If at any time you feel that your condition is worsening, call your doctor or return to the emergency department for reevaluation.     Please realize that the results of some studies that you had done during your stay with us (such as xrays and cultures) are only preliminarily resulted. Results of these studies may change as more information becomes available or as the studies are reevaluated by other members of our health care team in the next few days. We will attempt to contact you with any important changes or additions to the studies that were obtained today.     GENERAL PAIN MEDICATION PRECAUTIONS    You may have been prescribed medications for pain today. Some of these may contain a narcotic (Vicodin, Norco, Percocet, etc.) Take these pain medications as prescribed. You also may have been prescribed a muscle relaxant or anxiolytic (benzodiazepine) such as valium (diazepam) or ativan (lorazepam). Do not drink alcohol, drive, or operate heavy machinery while taking either narcotic or benzodiazepine medications. All narcotics and benzodiazepines have a risk of dependence, so please use with caution. If you were prescribed Vicodin, Norco, or Percocet, do not take additional products containing Tylenol (acetaminophen), as this can cause an acetaminophen overdose and can damage your liver. Do not take more than 4000mg of acetaminophen  daily.    You may also have been prescribed an anti-inflammatory pain medication (NSAID) such as ibuprofen (Motrin, Advil) or naproxen (Aleve). Take the NSAID as prescribed, with food or milk. Stop taking the NSAID if you develop abdominal pain, vomiting blood or dark/tarry or bloody stools. Be sure to drink plenty of fluids, at least 1-2 liters of water per day while taking an NSAID unless you have congestive heart failure or other condition that requires you to limit your water intake.

## 2017-05-11 ENCOUNTER — Emergency Department
Admission: EM | Admit: 2017-05-11 | Discharge: 2017-05-11 | Disposition: A | Discharge status: Home / Self Care | Attending: Emergency Medicine | Admitting: Emergency Medicine

## 2017-05-11 ENCOUNTER — Emergency Department

## 2017-05-11 DIAGNOSIS — Z9114 Patient's other noncompliance with medication regimen: Secondary | ICD-10-CM | POA: Insufficient documentation

## 2017-05-11 DIAGNOSIS — R569 Unspecified convulsions: Secondary | ICD-10-CM | POA: Insufficient documentation

## 2017-05-11 DIAGNOSIS — F445 Conversion disorder with seizures or convulsions: Secondary | ICD-10-CM | POA: Insufficient documentation

## 2017-05-11 DIAGNOSIS — Z91148 Patient's other noncompliance with medication regimen for other reason: Secondary | ICD-10-CM | POA: Insufficient documentation

## 2017-05-11 LAB — CBC WITH DIFFERENTIAL
BASOPHILS % AUTO: 0.4 % (ref 0.0–1.0)
BASOPHILS ABS AUTO: 0 10*3/uL (ref 0.0–0.1)
EOSINOPHIL ABS AUTO: 0.2 10*3/uL (ref 0.0–0.2)
Eosinophils % Auto: 3.6 % (ref 0.0–4.0)
HEMATOCRIT: 36.8 % (ref 36.0–48.0)
HEMOGLOBIN: 12.6 g/dL (ref 12.0–16.0)
IMMATURE GRANULOCYTES % AUTO: 0.2 % (ref 0.00–0.50)
IMMATURE GRANULOCYTES ABS AUTO: 0 10*3/uL (ref 0.0–0.0)
LYMPHOCYTE ABS AUTO: 1.3 10*3/uL (ref 1.3–2.9)
LYMPHOCYTES % AUTO: 25.5 % (ref 5.0–41.0)
MCH: 31.7 pg (ref 27.0–34.0)
MCHC G/DL: 34.2 g/dL (ref 33.0–37.0)
MCV: 92.5 fL (ref 82.0–97.0)
MONOCYTES % AUTO: 8.2 % (ref 0.0–10.0)
MONOCYTES ABS AUTO: 0.4 10*3/uL (ref 0.3–0.8)
MPV: 9.1 fL — AB (ref 9.4–12.4)
NEUTROPHIL ABS AUTO: 3.12 10*3/uL (ref 2.20–4.80)
NEUTROPHILS % AUTO: 62.1 % (ref 45.0–75.0)
NUCLEATED CELL COUNT: 0 10*3/uL (ref 0.0–0.1)
NUCLEATED RBC/100 WBC: 0 %{WBCs} (ref <=0.0)
PLATELET COUNT: 244 10*3/uL (ref 151–365)
RDW: 14.2 % (ref 11.5–14.5)
RED CELL COUNT: 3.98 10*6/uL (ref 3.80–5.10)
WHITE BLOOD CELL COUNT: 5 10*3/uL (ref 4.2–10.8)

## 2017-05-11 LAB — COMPREHENSIVE METABOLIC PANEL
ALANINE TRANSFERASE (ALT): 14 U/L (ref 4–56)
ALBUMIN: 4 g/dL (ref 3.2–4.7)
ALKALINE PHOSPHATASE (ALP): 65 U/L (ref 38–126)
ASPARTATE TRANSAMINASE (AST): 22 U/L (ref 9–44)
Alb/Glob Ratio: 1.3 (ref 1.0–1.6)
BILIRUBIN TOTAL: 0.6 mg/dL (ref 0.1–2.2)
BUN/CREATININE_MMC: 13.3 (ref 7.3–21.7)
CALCIUM: 9.7 mg/dL (ref 8.7–10.2)
CARBON DIOXIDE TOTAL: 26 mmol/L (ref 22–32)
CHLORIDE: 103 mmol/L (ref 99–109)
Creatinine Serum: 0.6 mg/dL (ref 0.50–1.30)
E-GFR, NON-AFRICAN AMERICAN: 128 mL/min/{1.73_m2} (ref 60–?)
E-GFR_MMC: 128 mL/min/{1.73_m2} (ref 60–?)
GLOBULIN BLOOD_MMC: 3.1 g/dL (ref 2.2–4.2)
GLUCOSE: 97 mg/dL (ref 70–99)
POTASSIUM: 4.2 mmol/L (ref 3.5–5.2)
PROTEIN: 7.1 g/dL (ref 5.9–8.2)
SODIUM: 138 mmol/L (ref 134–143)
UREA NITROGEN, BLOOD (BUN): 8 mg/dL (ref 6–21)

## 2017-05-11 LAB — URINALYSIS-COMPLETE
BILIRUBIN URINE: NEGATIVE
GLUCOSE URINE: NEGATIVE mg/dL
KETONES_MMC: 20 — AB
LEUK. ESTERASE: NEGATIVE
NITRITE URINE: NEGATIVE
OCCULT BLOOD URINE_MMC: NEGATIVE
PH URINE: 8 (ref 5.0–9.0)
PROTEIN URINE: NEGATIVE mg/dL
SPECIFIC GRAVITY URINE MMC: 1.014 (ref 1.002–1.030)
UROBILINOGEN.: 2 mg/dL — AB

## 2017-05-11 LAB — UR DRUGS OF ABUSE SCREEN
AMPHETAMINE SCREEN, URINE_MMC: NEGATIVE
BARBITURATES SCREEN, URINE: NEGATIVE
BENZODIAZEPINES SCREEN, URINE: POSITIVE — AB
CANNABINOID, URINE MMC: NEGATIVE
COCAINE METABOLITE SCRN, URINE: NEGATIVE
OPIATES SCREEN, URINE: POSITIVE — AB
PCP, URINE MMC: NEGATIVE
TRICYCLICS, URINE MMC: NEGATIVE

## 2017-05-11 LAB — VALPROATE

## 2017-05-11 LAB — MAGNESIUM (MG): MAGNESIUM (MG): 2.1 mg/dL (ref 1.8–2.5)

## 2017-05-11 LAB — LACTIC ACID: LACTIC ACID_MMC: 0.7 mmol/L (ref 0.5–1.9)

## 2017-05-11 MED ORDER — LORAZEPAM 2 MG/ML INJECTION SOLUTION
1.0000 mg | Freq: Once | INTRAMUSCULAR | Status: AC
Start: 2017-05-11 — End: 2017-05-11
  Administered 2017-05-11: 1 mg via INTRAVENOUS

## 2017-05-11 MED ORDER — NACL 0.9% IV BOLUS - DURATION REQ
500.0000 mL | Freq: Once | INTRAVENOUS | Status: AC
Start: 2017-05-11 — End: 2017-05-11
  Administered 2017-05-11: 500 mL via INTRAVENOUS

## 2017-05-11 MED ORDER — SODIUM CHLORIDE 0.9 % INTRAVENOUS SOLUTION
500.0000 mg | Freq: Once | INTRAVENOUS | Status: AC
Start: 2017-05-11 — End: 2017-05-11
  Administered 2017-05-11: 500 mg via INTRAVENOUS
  Filled 2017-05-11: qty 5

## 2017-05-11 MED ORDER — ACETAMINOPHEN 325 MG TABLET
650.0000 mg | ORAL_TABLET | Freq: Once | ORAL | Status: DC
Start: 2017-05-11 — End: 2017-05-11

## 2017-05-11 MED ORDER — ONDANSETRON HCL (PF) 4 MG/2 ML INJECTION SOLUTION
4.0000 mg | Freq: Once | INTRAMUSCULAR | Status: AC
Start: 2017-05-11 — End: 2017-05-11
  Administered 2017-05-11: 4 mg via INTRAVENOUS
  Filled 2017-05-11: qty 2

## 2017-05-11 MED ORDER — SODIUM CHLORIDE 0.9 % INTRAVENOUS SOLUTION
750.0000 mg | Freq: Once | INTRAVENOUS | Status: AC
Start: 2017-05-11 — End: 2017-05-11
  Administered 2017-05-11: 750 mg via INTRAVENOUS
  Filled 2017-05-11: qty 7.5

## 2017-05-11 MED ORDER — LORAZEPAM 2 MG/ML INJECTION SOLUTION
INTRAMUSCULAR | Status: AC
Start: 2017-05-11 — End: 2017-05-11
  Filled 2017-05-11: qty 1

## 2017-05-11 NOTE — ED Initial Note (Signed)
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  She received doses of Versed in the prehospital setting but continued having intermittent episodes of seizure-like activity.  Upon arrival information obtained through the significant other at the bedside indicates that [facility].  The patient then went to his grandmother's house and apparently had not taken her morning medications.  The patient then had multiple seizures as described according to paramedic report as well as by family at the bedside.  The patient ultimately wakes up and now is also complaining that her J-tube fell out a few days ago.  She is hoping to maybe get that back again.  The patient denies any vomiting or fever.  No other exacerbating alleviating symptoms or conditions currently.    Review of Systems - Positive and pertinent negatives as mentioned above in the history of present illness. 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 Somnolence has since resolved, per Neurology recommending against further benzodiazepines for seizure like activity as patient having pseudoseizures.     # Pseudoseizure. Pt with multiple hospitalizations and prior neuro eval with EEG. N[facility]CT neg  - pt with neg EEG in the past but pt is still on keppra and valproic acid, will defer this to her primary MD  - neuro recommends against treating the pseudoseizure. Trial of NS eye drops if having convulsions.     #Syncope  #POTS  Patient with a history of POTS, she states that she is on salt tabs for the treatment of this, unable to take currently due to J tube malfunction. Per patient she had a positive tilt table test in the past. Additional fall 8/30 when working with PT, per PT no prodrome just sudden loss of consciousness. EKG on 8/26 normal. No events on telemetry x24 hours including during last pseudoseizure episode, echo unremarkable. Will also provide compression stockings for mechanical support.   -Compression stockings ordered through orthotics  -PT evaluation completed      No family history on file.    Social History  Social History    Marital status: SINGLE     Spouse name: N/A    Number of children: N/A    Years of education: N/A     Occupational History    Not on file.     Social History Main Topics    Smoking status: Never Smoker    Smokeless tobacco: Never Used    Alcohol use No    Drug use: No    Sexual activity: Not on file     Other Topics Concern    Not on file     Social History Narrative    ** Merged History Encounter **         ** Merged History Encounter **            Allergies   Allergen Reactions    Amoxicillin Hives    Amoxicillin Hives and Shortness of Breath    Asa [Aspirin] Unknown-Explain in Comments     bleeding    Aspirin Other-Reaction in Comments      Bleeding risk    Basiliximab Angioedema    Basiliximab Hives    Betadine [Povidone-Iodine] Hives    Cheese Nausea/Vomiting    Docusate Anaphylaxis    Docusate Sodium Anaphylaxis    Doxycycline Rash    Doxycycline Hives and Nausea/Vomiting    Garlic Anaphylaxis    Garlic Anaphylaxis     Reported as per patient    Penicillin Hives    Penicillin Hives    Shellfish Containing Products Anaphylaxis    Shellfish Containing Products Anaphylaxis    Sweet Potato Hives    Sweet Potato Hives    Vancomycin Rash         Current Facility-Administered Medications:     Acetaminophen (TYLENOL) Tablet 650 mg, 650 mg, ORAL, ONCE, Jabier Mutton    Current Outpatient Prescriptions:     Acetaminophen (TYLENOL) 325 mg Tablet, Take 325 mg by mouth every 6 hours if needed for pain or Fever., Disp: , Rfl:     Albuterol (PROVENTIL, VENTOLIN) 2.5 mg/0.5 mL Nebulized Solution, Use 2.5 mg in nebulizer every 4 hours if needed for wheezing., Disp: , Rfl:     Calcium Carbonate (TUMS E-X) 760m Chewable Tablet, Chew and swallow 750 mg by mouth 4 times daily if needed. Indications: Dyspepsia, Heartburn, Disp: , Rfl:     Cetirizine (ZYRTEC) 10 mg Tablet, Take 10 mg by mouth every evening., Disp: , Rfl:     Diazepam (VALIUM) 5 mg Tablet, Take 5 mg by mouth As Directed. TAKE 5 MG IF SEIZURE LASTS AT LEAST 5 OR MORE MINUTES., Disp: , Rfl:     DiphenhydrAMINE (BENADRYL) 50 mg tablet, Take 50 mg by mouth every 8 hours if needed. Indications: allergic reaction, Nausea        , Disp: , Rfl:     Divalproex (DEPAKOTE) 500 mg Delayed Release Tablet, Take 500 mg by mouth 2 times daily., Disp: , Rfl:     Dronabinol (MARINOL) 10 mg Capsule, Take 10 mg by mouth 3 times daily., Disp: , Rfl:     Epinephrine, Anaphylaxis, 0.3 mg/0.3 mL Injection, Inject 0.3 mg into the muscle one time if needed for allergic reaction., Disp: , Rfl:     Esomeprazole (NEXIUM) 20 mg Delayed Release Capsule, Take 20 mg by mouth every morning before a meal., Disp:  , Rfl:     Estradiol Weekly (CLIMARA) 0.1 mg/24 hr Patch, Apply 1 patch to the skin every Saturday., Disp: 4 patch, Rfl: 0  FamoTIDine (PEPCID) 20 mg Tablet, Take 1 tablet by mouth 2 times daily., Disp: 60 tablet, Rfl: 0    FLUoxetine 60 mg Tablet, Take 60 mg by mouth every day at bedtime., Disp: , Rfl:     food supplemt, lactose-reduced (BOOST PLUS) 0.06 gram- 1.5 kcal/mL Liquid, Take 1 can by mouth 3 times daily with meals., Disp: , Rfl:     Hydrocortisone (CORTEF) 10 mg Tablet, Take 15 mg by mouth every 8 hours if needed (fever/illness)., Disp: , Rfl:     Hydrocortisone (SOLU-CORTEF) 100 mg Injection, Inject 50 mg into the muscle if needed (Unable to use PO hydrocortisone)., Disp: , Rfl:     Hydromorphone (DILAUDID) 2 mg Tablet, Take 1 tablet by mouth every 4 hours if needed. (Patient taking differently: Take 2 mg by mouth every 4 hours if needed. Indications: Severe Pain        ), Disp: 40 tablet, Rfl: 0    HydrOXYzine (ATARAX) 25 mg Tablet as Hydrochloride, Take 25 mg by mouth every 6 hours if needed for anxiety.        , Disp: , Rfl:     Ibuprofen (CHILDREN'S MOTRIN, ADVIL) 100 mg/5 mL Liquid, Take 600 mg by mouth three times daily if needed for pain., Disp: , Rfl:     LevETIRAcetam (KEPPRA) 100 mg/mL Liquid, Take 7.5 mL by mouth 2 times daily., Disp: 210 mL, Rfl: 0    Lorazepam (ATIVAN) 2 mg Tablet, Take 2 mg by mouth every 8 hours if needed for anxiety., Disp: , Rfl:     MedroxyPROGESTERone (PROVERA) 10 mg Tablet, Take 10 mg by mouth As Directed. 10 MG FOR 10 DAYS EVERY 3-4 MONTHS, Disp: , Rfl:     melatonin 10 mg Tablet, Take 1 tablet by mouth every day at bedtime if needed (sleep).        , Disp: , Rfl:     methylprednisolone sod succ (SOLU-MEDROL INJ), Inject 50 mg into the muscle once daily if needed (iLLNESS OR TRAUMA)., Disp: , Rfl:     Metoclopramide (REGLAN) 10 mg Tablet, Take 10 mg by mouth 4 times daily., Disp: , Rfl:     Miscellaneous Medication, Take 1-2 mL by mouth 2 times  daily. Indications: CBD oil, Disp: , Rfl:     Mometasone-Formoterol (DULERA) 200-5 mcg/actuation HFA Aerosol Inhaler, Take 2 puffs by inhalation 2 times daily., Disp: , Rfl:     Montelukast (SINGULAIR) 10 mg Tablet, Take 10 mg by mouth every evening., Disp: , Rfl:     nutritional supplements (NUTREN 1.5) 0.07 gram-1.5 kcal/mL Liquid, Take 1 Dose by jejeunostomy tube 3 times daily.        , Disp: , Rfl:     Ondansetron (ZOFRAN) 4 mg/5 mL solution, Take 4 mg by mouth three times daily if needed for nausea/vomiting., Disp: , Rfl:     Oxycodone 10 mg Tablet, Take 10 mg by mouth every 6 hours if needed. Indications: Pain        , Disp: , Rfl:     Polyethylene Glycol 3350 (MIRALAX) 17 gram Powder, Take 17 g by mouth 2 times daily., Disp: , Rfl:     Pregabalin (LYRICA) 75 mg capsule, Take 75 mg by mouth 3 times daily., Disp: , Rfl:     Promethazine (PHENERGAN) 6.25 mg/5 mL Liquid, Take 10 mL by mouth every 4 hours if needed for nausea/vomiting., Disp: 120 mL, Rfl: 0    Ranitidine (ZANTAC) 150 mg Tablet, Take 150 mg by mouth  2 times daily., Disp: , Rfl:     Rizatriptan (MAXALT) 10 mg Tablet, Take 10 mg by mouth if needed. Indications: Migraine        , Disp: , Rfl:     Scopolamine (TRANSDERM-SCOP) 1 mg over 3 days Patch, Apply 1 patch to the skin every third day. (Every 72 hours)., Disp: , Rfl:     Sodium Chloride 1 gram tablet, Take 2 g by mouth every morning., Disp: , Rfl:     Sumatriptan (IMITREX) 25 mg Tablet, Take 25 mg by mouth one time if needed for migraine headache., Disp: , Rfl:     Tramadol (ULTRAM) 50 mg Tablet, Take 50 mg by mouth every 6 hours if needed for pain., Disp: , Rfl:     Trimethoprim 160 mg/Sulfamethoxazole 800 mg (BACTRIM DS) Tablet, Take 1 tablet by mouth 2 times daily., Disp: , Rfl:     Valproate Sodium (DEPAKENE SYRUP) 250 mg/5 mL Liquid, Take 5 mL by mouth 2 times daily., Disp: 300 mL, Rfl: 5    Physical Exam    BP 93/54  Pulse 87  Temp 36.9 C (98.4 F)  Resp (!) 141  Ht  1.524 m (5')  Wt 41.1 kg (90 lb 9.7 oz)  LMP 04/21/2017 (Approximate)  SpO2 97%  BMI 17.7 kg/m2    Constitutional: Very thin young female who is having intermittent episodes of possible seizure activity.    Head: Atraumatic. Normocephalic.    Eyes: PERRL.  Discharge. Conjunctivae are pink. Sclerae are anicteric.    ENT: Airway is patent. Oropharynx is clear and symmetric. Mucous membranes are moist.    Neck: No appreciable lymphadenopathy. Neck is supple without meningismus.     Cardiovascular: Regular rate. Regular rhythm. No murmurs, rubs, or gallops. Distal pulses are equal.    Pulmonary/Chest: No evidence of respiratory distress. Patient is speaking in full sentences without accessory muscle usage. Clear to auscultation bilaterally. No wheezing, rales or rhonchi. Chest is non-tender.  This exam carried out after the patient was now awake and answering questions    Abdominal: Soft and non-distended. There is no tenderness. No rebound, guarding, or rigidity. No organomegaly.     GU: N/a    Back: No tenderness.    Extremities: Full range of motion in all extremities. No peripheral edema. No calf tenderness. No cyanosis.    Skin: Skin is warm and dry. No diaphoresis. No rashes.    Neurological: Patient initially was obtunded in appearance possibly postictal with short-lived seizure activity when she arrives here.  Subsequent neurologic exams the patient is awake alert without lateralizing deficits.    Psychiatric : Assessed after her mental status improved she is awake alert.  She does appear depressed however.    Labs:    Results for orders placed or performed during the hospital encounter of 05/11/17   CBC WITH DIFFERENTIAL     Status: Abnormal   Result Value Status    White Blood Cell Count 5.0 Final    Red Blood Cell Count 3.98 Final    Hemoglobin 12.6 Final    Hematocrit 36.8 Final    MCV 92.5 Final    MCH 31.7 Final    MCHC 34.2 Final    RDW 14.2 Final    MPV 9.1 (L) Final    Platelet Count 244 Final     Neutrophils % Auto 62.1 Final    Lymphocytes % Auto 25.5 Final    Monocytes % Auto 8.2 Final    Eosinophil %  Auto 3.6 Final    Basophils % Auto 0.4 Final    Immature Granulocytes % 0.20 Final    Neutrophil Abs Auto 3.12 Final    Lymphocyte Abs Auto 1.3 Final    Monocytes Abs Auto 0.4 Final    Eosinophil Abs Auto 0.2 Final    Basophils Abs Auto 0.0 Final    Nucleated RBC % Auto 0.0 Final    NRBC Abs Auto 0.0 Final    Immature Granulocytes Abs Auto 0.0 Final   COMPREHENSIVE METABOLIC PANEL     Status: Normal   Result Value Status    Sodium 138 Final    Potassium 4.2 Final    Chloride 103 Final    Carbon Dioxide Total 26 Final    Urea Nitrogen, Blood (BUN) 8 Final    Glucose 97 Final    Calcium 9.7 Final    Protein 7.1 Final    Albumin 4.0 Final    Alkaline Phosphatase (ALP) 65 Final    Aspartate Transaminase (AST) 22 Final    Bilirubin Total 0.6 Final    Alanine Transferase (ALT) 14 Final    Creatinine Serum 0.60 Final    Globulin 3.1 Final    Alb/Glob Ratio 1.3 Final    BUN/ Creatinine 13.3 Final    E-GFR 128 Final    E-GFR, Non-African American 128 Final   MAGNESIUM (MG)     Status: Normal   Result Value Status    Magnesium (Mg) 2.1 Final   LACTIC ACID     Status: Normal   Result Value Status    Lactic Acid 0.7 Final   URINALYSIS-COMPLETE     Status: Abnormal   Result Value Status    Color Yellow Final    Clarity Cloudy (Abnl) Final    Specific Gravity  1.014 Final    pH Urine 8.0 Final    Protein Negative Final    Glucose Negative Final    Ketones 20  (Abnl) Final    Bilirubin Negative Final    Urobilinogen 2.0 (Abnl) Final    Occult Blood  Negative Final    Leukocyte Esterase Negative Final    Nitrite Negative Final    Squamous Epithelial Cells <1 Final    Mucous Few Final    Amorphous Crystals Few Final   UR DRUGS OF ABUSE SCREEN     Status: Abnormal   Result Value Status    Benzodiazepines Screen, Urine POSITIVE (Abnl) Final    Cocaine Metabolite Scrn, Urine NEGATIVE Final    Amphetamine Screen, Urine NEGATIVE  Final    Opiates Screen, Urine POSITIVE (Abnl) Final    Barbiturates Screen, Urine NEGATIVE Final    PCP, Urine NEGATIVE Final    Cannabinoid, Urine NEGATIVE Final    Tricyclics, Urine NEGATIVE Final   VALPROATE     Status: Abnormal   Result Value Status    Valproate <10.0 (L) Final       Labs Reviewed   CBC WITH DIFFERENTIAL - Abnormal; Notable for the following:        Result Value    MPV 9.1 (*)     All other components within normal limits   URINALYSIS-COMPLETE - Abnormal; Notable for the following:     Clarity Cloudy (*)     Ketones 20  (*)     Urobilinogen 2.0 (*)     All other components within normal limits   UR DRUGS OF ABUSE SCREEN - Abnormal; Notable for the following:     Benzodiazepines  Screen, Urine POSITIVE (*)     Opiates Screen, Urine POSITIVE (*)     All other components within normal limits   VALPROATE - Abnormal; Notable for the following:     Valproate <10.0 (*)     All other components within normal limits   COMPREHENSIVE METABOLIC PANEL - Normal    Narrative:     eGFR results >60 is indicative of normal kidney function.    **Stage 3 CKD   GFR 30-59  **Stage 4 CKD   GFR 15-29  **Stage 5 CKD   GFR <15**    The eGFR is an estimate of a patient's kidney function; it is most useful when it is in a relatively steady state and can be used to stage Chronic Kidney Disease. It may not be appropriate to adjust medication dosing. Consult your Pharmacist. The eGFR may not be accurate for patients with extremes in body mass, muscle mass or nutritional status.    eGFR calculation is based on the (MDRD) equation validated only in Caucasians and African-Americans 30-61 years of age. If patient's race is not indicated and patient is African-American multiply the result by 1.21.    The eGfR may not be accurate for patients with extremes in body mass,muscle mass or nutritional status. The calculation may not be appropriate for medication adjustments in patients with renal impairment (consult Pharmacist).    MAGNESIUM (MG) - Normal   LACTIC ACID - Normal   LACTIC ACID   POC PREGNANCY SPOT URINE (UPT)       None CT brain scan is negative per radiologic interpretation for any acute lesions.      MDM: Patient presents at this time with altered level of consciousness.  The patient allegedly has a history of seizures.  The differential diagnosis includes acute infectious causes, electrolyte or renal disorders, glycemic abnormalities, traumatic causes, drug or alcohol intoxication, psychiatric disorders, as well as medication noncompliance.  The patient undergoes evaluation here and the diagnostic data are reviewed.  Initially the patient was treated with Ativan as she did have what may have been a seizure upon arrival.  However ultimately we are able to review her entire record and it is very clearly documented this patient has psychogenic or nonepileptic seizures.  The patient receives a loading dose of Keppra and IV Depakote.  It is of note that the patient Depakote level was actually 0 even though she states she only missed one dose this morning.  Subsequently her mentation stabilizes.  She is awake alert throughout the remainder of her ED evaluation.  She was concerned about the fact that she does not have her J-tube any longer which has come out several days ago.  I have explained to her as well as her significant other at the bedside the need for follow-up with primary care and that will have to be reinserted under a surgical procedure.  That is not something to be accomplished here in the ED.  Unfortunately the tract is already closed over as well.  The patient ultimately is discharged with follow-up recommended with primary care for ongoing treatment.  I suspect the patient's condition consistent with pseudoseizures but also noncompliance with her medications as evident by her Depakote level here.  There is no evidence of acute intracranial process such as injury and I highly doubt any infectious cause as  well.    Clinical Impression:   1. Pseudoseizures    2. Noncompliance with medications        The  following medications were prescribed    New Prescriptions    No medications on file       Comment: Please note this record has been produced using speech recognition system and may contain errors related to that system including errors in grammar, punctuation, and spelling, as well as words and phrases that may be inappropriate. If there are any questions or concerns please feel free to contact the dictating provider for clarification.
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
## 2017-05-11 NOTE — ED Nursing Note (Addendum)
Pt requesting pain Rx, also wants to be admitted so she can have her J tube replaced. "I'm probably dehydrated."      Appears comfortable, PWD, NAD, no observed Sz activity, speaks full sentences w/fiance .

## 2017-05-11 NOTE — ED Nursing Note (Signed)
Pt care transferred to Alex C RN

## 2017-05-11 NOTE — ED Nursing Note (Signed)
Report received from [facility], assumed care. Pt appears to be asleep, eyes closed and respirations even and unlabored. Room stripped. NAD noted at this time, will continue to monitor. Pending DCP and psych evaluation.

## 2017-05-11 NOTE — ED Nursing Note (Signed)
PES at bedside to evaluate pt.

## 2017-05-11 NOTE — ED Triage Note (Signed)
Pt dc'd from [place] this morning here today for having 14 seizures PTA, Versed 5mg  IN given by medics.  Pt arrived post ictal, eval by Dr. Lulu Ridingliaro and placed on cardiac monitoring with cont SpO2 monitoring.  Pt then began to seize again full body, gazing up to the right.

## 2017-05-11 NOTE — ED Nursing Note (Signed)
IO d/c'd L tibia and dressing placed

## 2017-05-11 NOTE — ED Nursing Note (Signed)
Report to Lee RN to assume pt care

## 2017-05-11 NOTE — ED Nursing Note (Signed)
Pt A&OX4, skin pale, warm, dry. RN explained plan for DCP to come to speak with her; pt states she is unsure of whether she wants to return to [place] because of her strained relationship with her parents, and is also unsure about whether she wants to return to live with her fiance and his family. NAD noted. Will continue to monitor.

## 2017-05-11 NOTE — ED Nursing Note (Signed)
Unable to void

## 2017-05-11 NOTE — ED Nursing Note (Signed)
Saline drops instilled into eyes after review of inpt chart. No immediate response from pt.

## 2017-05-11 NOTE — ED Nursing Note (Signed)
Pt cleared for D/C by provider, AVS and follow up instructions reviewed w/ pt by MD. Pt verbalized understanding. Pt ambulatory, [facility] Pt w/ steady gait visualized.

## 2017-05-11 NOTE — ED Nursing Note (Signed)
No IV access at this time.

## 2017-05-11 NOTE — ED Nursing Note (Signed)
Cont nausea without emesis, requesting Benadryl now for this. Chart up for recheck.

## 2017-05-11 NOTE — ED Nursing Note (Signed)
Alert, conversant with Chloe Ridingliaro, MD @BS .

## 2017-05-11 NOTE — ED Nursing Note (Signed)
Assumed care of pt. No s/s of distress noted, breathing even and unlabored. MHW at bedside at this time.

## 2017-05-11 NOTE — ED Nursing Note (Signed)
pt dc earlier today and left behind mouth piece. Item secured and plan to setup shipment to [place] address where pt was given a lift ride to.

## 2017-05-11 NOTE — Allied Health Progress (Signed)
CLINICAL SOCIAL SERVICES   CRISIS SERVICES BRIEF NOTE  Note Date and Time: 05/11/2017    02:49  Date of Admission: 05/10/2017  5:18 PM    Patient Name: Chloe Barron Referral Source: Medical Staff    DOB: 08/20/1996  Age: [age]yr     Medical staff requesting a psychiatric assessment for GD.  PES will evaluate ASAP.        Signature: Fredonia Highlanduane S. Sherril Shipman, LCSW      Pager # [phone]  Licensed Clinical Social Worker

## 2017-05-11 NOTE — ED Nursing Note (Signed)
Pt found sitting in corner, stating that she feels sad and frustrated because she feels as though staff does not believe her and blames her for "everything". RN acknowledged pt's feelings and encouraged open expression of her feelings. MH Navigator at bedside to speak with patient, pt agreeable to conversation. Report given to WidenerStephanie, Charity fundraiserN.

## 2017-05-11 NOTE — ED Nursing Note (Signed)
Pt called this nurse to the room and stated "I'm bleeding". IV site found to be wet. Tegaderm removed and IV site inspected- catheter tip found not in vein and cut , 2 apparent cuts in the catheter tip at sharp angles. This RN asked pt if she tampered with the IV. She stated "no" began crying and stated "I'm frustrated that everybody thinks that I did that and that I pulled out my J tube" Pt then got out of bed, sat in corner of the room, knees pulled up to chest, covered by blanket and cried. MDs Palmerlee/Barlow notified of this event. OK to leave out IV. Room stripped of all medical equipment. Pts property secured. Pt tearful but cooperative with care.

## 2017-05-11 NOTE — ED Nursing Note (Signed)
Pt changing at this time, all belongings returned to pt. Pt requesting a Lyft, charge RN given address per pt request.

## 2017-05-11 NOTE — ED Nursing Note (Signed)
Pt verbalizes understanding of discharge and follow up care. All questions addressed and answered at this time.

## 2017-05-11 NOTE — ED Nursing Note (Signed)
Pt assisted onto bedpan.

## 2017-05-11 NOTE — Allied Health Progress (Signed)
CLINICAL SOCIAL SERVICES   PES Referral  Note Date & Time: 05/11/2017 05:01  Date of Admission: 05/10/2017  5:18 PM    Date of Service: 05/11/2017 Referred By: medical team   Patient Name: Chloe Barron Ethnicity: The patient's reported ethnicity is , and she identifies as Not Hispanic or Latino.        DOB: 10/15/1995  Age: [age]yr    INTERVENTION/PLAN  Consulted with Triage RN; [facility].     Following the psychiatric interview the Pt does not meet criteria for a 5150 Hold.        Signature: Clent JacksCraig Cella Cappello, LCSW    Pager [phone]#:[phone]  Licensed Clinical Social Worker

## 2017-05-11 NOTE — ED Nursing Note (Signed)
WOUND/DEVICE CLEANUP NOTE    Note Started: 05/11/2017, 16:15     To maintain record integrity and accuracy, 4 active wound(s)/device(s) were removed from the EMR database. Lavonna RuaLee Kensington Duerst, RN

## 2017-05-11 NOTE — Allied Health Progress (Signed)
CLINICAL SOCIAL SERVICES   PES Referral  Note Date & Time: 05/11/2017 05:01  Date of Admission: 05/10/2017  5:18 PM    Date of Service: 05/11/2017 Referred By: medical team   Patient Name: Chloe Barron Ethnicity: The patient's reported ethnicity is , and she identifies as Not Hispanic or Latino.        DOB: 12/02/1995  Age: [age]yr    INTERVENTION/PLAN  Consulted with Triage RN; [facility].         Signature: Clent JacksCraig Matej Sappenfield, LCSW    Pager [phone]#:[phone]  Licensed Clinical Social Worker

## 2017-05-11 NOTE — ED Progress Note (Signed)
ED Progress Note  I received sign out on this patient from Dr. Linde GillisMaynard at change of shift. Please refer to prior documentation for details of the history, physical examination, and initial management.    Briefly, the pt is a 21 yo female with multiple hospitalization for inconclusive pathology. Presenting with pseudoseizure.     ED Course:   Patient signed out to me pending psychiatry evaluation and discharge planning recs.     Per psychiatry, patient is not gravely disabled, no additional treatment intervention at this time.  She will need extensive outpatient therapy for evaluation and treatment.  Discharge planning evaluated patient, she has a boyfriend in the area with whom she can stay tonight.  Ultimately, she would like to return to her family [place].  Given no criteria for 5150 hold, medically cleared, patient discharged.    Temp: 36.8 C (98.2 F) (05/11/2017  6:00 AM)  Temp src: Oral (05/11/2017  6:00 AM)  Pulse: 65 (05/11/2017  6:00 AM)  BP: 95/60 (05/11/2017  6:00 AM)  Resp: 16 (05/11/2017  6:00 AM)  SpO2: 100 % (05/11/2017  6:00 AM)  Height: 1.524 m (5') (05/08/2017 11:55 AM)  Weight: 43.5 kg (96 lb) (05/08/2017 11:55 AM)    Dispo:     Discharge home     Diagnosis:    ICD-10-CM    1. Depression, unspecified depression type F32.9    2. PTSD (post-traumatic stress disorder) F43.10    3. Borderline personality disorder F60.3        Electronically signed by: Brooke BonitoAnke Z Taiwana Willison, MD, Resident

## 2017-05-12 ENCOUNTER — Emergency Department

## 2017-05-12 ENCOUNTER — Other Ambulatory Visit: Payer: Self-pay

## 2017-05-12 ENCOUNTER — Emergency Department
Admission: AD | Admit: 2017-05-12 | Discharge: 2017-05-12 | Disposition: A | Discharge status: Home / Self Care | Attending: Emergency Medicine | Admitting: Emergency Medicine

## 2017-05-12 DIAGNOSIS — G40909 Epilepsy, unspecified, not intractable, without status epilepticus: Secondary | ICD-10-CM

## 2017-05-12 DIAGNOSIS — I951 Orthostatic hypotension: Secondary | ICD-10-CM

## 2017-05-12 DIAGNOSIS — W19XXXA Unspecified fall, initial encounter: Secondary | ICD-10-CM | POA: Insufficient documentation

## 2017-05-12 DIAGNOSIS — R000 Tachycardia, unspecified: Principal | ICD-10-CM

## 2017-05-12 DIAGNOSIS — G90A Postural orthostatic tachycardia syndrome (POTS): Secondary | ICD-10-CM | POA: Insufficient documentation

## 2017-05-12 LAB — ELECTROCARDIOGRAM WITH RHYTHM STRIP: QTC: 442

## 2017-05-12 NOTE — ED Initial Note (Addendum)
EMERGENCY DEPARTMENT PHYSICIAN NOTE - Aryan Laing       Date of Service:   05/12/2017 10:52 AM Patient's PCP: No Pcp Per Patient   Note Started: 05/12/2017 13:44 DOB: 10-23-1995             Chief Complaint   Patient presents with    Falls           The history provided by the patient and spouse.  Interpreter used: No    Chloe Barron is a 21yr old female, with a past medical history significant for seizure disorder, pseudoseizures, Diamond-Blackfan anemia (s/p BMT), gastroparesis and POTS, who presents to the ED with a chief complaint of POTS flare today that began PTA. Walking at home and fell.  +LOC.  No nausea/vomiting    Patient has been in seen the ER yesterday; and reported she was seen at [facility] the day before; although this encounter is not documented in EMR.     Reports she also needs GJ tube replaced; reporting that she had a seizure at [facility] and the tube was removed at that time.  The staff refused to place it.  Reporting "they were fighting each other with scalpel"      A full history, including pertinent past medical and social history was reviewed and updated as necessary.    HISTORY:  There are no active hospital problems to display for this patient.   Allergies   Allergen Reactions    Amoxicillin Hives    Amoxicillin Hives and Shortness of Breath    Asa [Aspirin] Unknown-Explain in Comments     bleeding    Aspirin Other-Reaction in Comments     Bleeding risk    Basiliximab Angioedema    Basiliximab Hives    Betadine [Povidone-Iodine] Hives    Cheese Nausea/Vomiting    Docusate Anaphylaxis    Docusate Sodium Anaphylaxis    Doxycycline Rash    Doxycycline Hives and Nausea/Vomiting    Garlic Anaphylaxis    Garlic Anaphylaxis     Reported as per patient    Penicillin Hives    Penicillin Hives    Shellfish Containing Products Anaphylaxis    Shellfish Containing Products Anaphylaxis    Sweet Potato Hives    Sweet Potato Hives    Vancomycin Rash      Past Medical History:  04/11/2017:  Asthma  No date: Asthma  No date: Chronic pain  No date: Conductive hearing loss  04/11/2017: Diamond-Blackfan anemia  No date: Diamond-Blackfan anemia  No date: Epilepsy  No date: Essential hypertension  No date: Gastroparesis  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)  04/11/2017: Hypertension  No date: Osteoporosis  No date: Ovarian failure  No date: POTS (postural orthostatic tachycardia syndrom*  No date: Protein calorie malnutrition  No date: Pseudoseizure  No date: PTSD (post-traumatic stress disorder)  04/11/2017: Seizure disorder  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 Past Surgical History:  05/26/2014: Insertion, tympanostomy tube  No date: Jejunostomy tube   Social History    Marital status: SINGLE              Spouse name:                       Years of education:                 Number of children:               Occupational History    None on  file    Social History Main Topics    Smoking status: Never Smoker                                                                Smokeless status: Never Used                        Alcohol use: No              Drug use: No              Sexual activity: Not on file          Other Topics            Concern    None on file    Social History Narrative    ** Merged History Encounter **         ** Merged History Encounter **          No family history on file.             Review of Systems   Constitutional: Negative for chills and fever.   HENT: Negative for congestion and ear pain.    Eyes: Negative for photophobia and visual disturbance.   Respiratory: Negative for cough and shortness of breath.    Gastrointestinal: Negative for abdominal pain, nausea and vomiting.   Musculoskeletal: Positive for neck pain. Negative for back pain and neck stiffness.   Neurological: Positive for syncope. Negative for seizures and weakness.   All other systems reviewed and are negative.      TRIAGE VITAL SIGNS:  Temp: 36.8 C (98.2 F) (05/12/17 1049)  Temp src: Oral (05/12/17  1049)  Pulse: 112 (05/12/17 1049)  BP: 111/71 (05/12/17 1049)  Resp: 18 (05/12/17 1049)  SpO2: 98 % (05/12/17 1049)  Weight: 34.5 kg (76 lb) (05/12/17 1049)    Physical Exam   Constitutional: She is oriented to person, place, and time. No distress.   Thin for state age. Chronically ill appearing   HENT:   Head: Normocephalic and atraumatic.   Mouth/Throat: Oropharynx is clear and moist.   Eyes: Conjunctivae and EOM are normal. Pupils are equal, round, and reactive to light.   Neck: Normal range of motion. Neck supple.   No c spine tenderness  C collar in place   Cardiovascular: Normal rate, regular rhythm, normal heart sounds and intact distal pulses.    Pulmonary/Chest: Effort normal and breath sounds normal. She has no wheezes.   Abdominal: Soft. Bowel sounds are normal. There is no tenderness. There is no rebound.   GT site closed   Musculoskeletal: Normal range of motion. She exhibits no edema.   Neurological: She is alert and oriented to person, place, and time. No cranial nerve deficit.   Skin: Skin is warm and dry.   Psychiatric: She has a normal mood and affect. Her behavior is normal.   Nursing note and vitals reviewed.        INITIAL ASSESSMENT & PLAN, MEDICAL DECISION MAKING, ED COURSE  Chloe Barron is a 58yr female who presents with a chief complaint of   Chief Complaint   Patient presents with    Falls    .     Differential includes,  but is not limited to: ICH, fracture, anxiety, seizure, dehydration, POTS      The results of the ED evaluation were notable for the following:    Pertinent imaging results (reviewed and interpreted independently by me):        Radiology reads:   [facility] Head Without Contrast    Result Date: 05/12/2017  Technique: Noncontrast head CT from base of skull to vertex. Following helical acquisition, transaxial slices and sagittal and coronal reformatted images were obtained. INDICATIONS: Status post fall with head injury.  Comparison: CT of the head dated April 11, 2017. Findings:  Examination is slightly degraded by external artifact. There is no evidence of intracranial hemorrhage, brain contusion, gross acute infarct, mass, mass effect, edema, or abnormal extra-axial fluid collection. Brain parenchyma appears stable and essentially normal, except for possible mild global involutional changes. The skull is intact. Ventricles, sulci, and cisterns are all within normal limits. Visualized portions of the paranasal sinuses, mastoid air cells, and middle ear airspaces are essentially clear. Orbits and extracranial soft tissues are unremarkable.     Impression: No evidence of acute intracranial injury or other acute intracranial disease process. No evidence of skull fracture. Question mild global show changes, which is unusual for age. Stable and otherwise unremarkable examination. Radiation Dose Indicators: CTDI 45.2 mGy, DLP 951 mGy-cm    [facility] Ct Head Without Contrast    Result Date: 05/12/2017  TECHNIQUE: Noncontrast head CT from base of skull to vertex. Following helical acquisition, transaxial slices and sagittal and coronal reformatted images were reviewed. INDICATION: Seizures  COMPARISON: April 11, 2017 Radiation dose indicators: CTDI 45 mGy, DLP 933 mGy-cm FINDINGS: Some images are limited by motion and beam hardening artifact. There are no foci of abnormal or altered attenuation within the cerebral or cerebellar parenchyma. The ventricles and sulci are symmetric and age appropriate without midline shift. There are no abnormal extra-axial fluid collections. Limited evaluation of the paranasal sinuses, mastoid air cells, orbits, skull and scalp is unremarkable.     IMPRESSION: 1. There is no CT evidence of acute intracranial hemorrhage, acute large vessel territory infarct or other acute intracranial pathology. Please note that a negative noncontrast head CT does not exclude all acute intracranial pathology including infarct. MRI is more sensitive modality. 2. I do not see any MRIs of the  brain on this patient at this facility. If this is a new onset seizure disorder and/or if the patient has not been evaluated with MRI, then I recommend further evaluation with MRI on a nonemergent basis. Note: A preliminary report was rendered this case by [facility] on an emergent basis.    [facility] Ct Cervical Spine    Result Date: 05/12/2017  EXAMINATION: NONCONTRAST ENHANCED CT SCAN OF THE CERVICAL SPINE TECHNIQUE: Non-IV contrast enhanced helical CT imaging of the cervical spine was performed from the skull base through the thoracic inlet level. Images were constructed in the axial, coronal and sagittal planes. INDICATION: Fall COMPARISON: April 11, 2017. FINDINGS: Bone anatomy: The patient is imaged in a cervical collar. The bone alignment and mineralization are normal. There is no evidence for fracture or listhesis. The craniocervical junction and the C1-2 articulation are intact. The ring of C1 is incomplete posteriorly which appears to be chronic and is likely developmental. The central spinal canal is otherwise intact at all levels. Disc spacing is preserved. Visualized brain and skull base: Unremarkable Soft tissues: The paravertebral soft tissues are unremarkable. There is a 4 mm calcified nodule in the right  anterior chest wall at the level of the thoracic inlet of undetermined significance. No mass or lymphadenopathy. The visualized lungs are clear. No pleural effusion or pneumothorax.     IMPRESSION: 1. No evidence of acute bone injury Radiation Dose Indicators: CTDI 6 mGy, DLP 167 mGy-cm           Chart Review: I reviewed the patient's prior medical records. Pertinent information that is relevant to this encounter   - ED notes yesterday   last [facility] admission.      Patient Summary: Chloe Barron is a 21yr old female, with a past medical history significant for seizure disorder, pseudoseizures, Diamond-Blackfan anemia (s/p BMT), gastroparesis and POTS, presents to the emergency room complaining of "pot spell"  with a fall/syncope at home.  She is well-appearing.  She is alert and oriented upon my examination.  She was just seen in the emergency room yesterday for seizure versus pseudoseizure laboratory data at that time was unremarkable and she was discharged home. CT imaging is negative; again advised the patient that she is to follow-up as an outpatient for GJ placement      LAST VITAL SIGNS:  Temp: 36.8 C (98.2 F) (05/12/17 1049)  Temp src: Oral (05/12/17 1049)  Pulse: 112 (05/12/17 1200)  BP: 122/89 (05/12/17 1200)  Resp: 20 (05/12/17 1200)  SpO2: 99 % (05/12/17 1200)  Weight: 34.5 kg (76 lb) (05/12/17 1049)      Clinical Impression:     ICD-10-CM    1. Fall, initial encounter W19.XXXA    2. POTS (postural orthostatic tachycardia syndrome) R00.0     I95.1              Disposition: Discharge. Follow up with PCP. ED discharge instructions were reviewed and provided.      PATIENT'S GENERAL CONDITION:  Good: Vital signs are stable and within normal limits. Patient is conscious and comfortable. Indicators are excellent.     Electronically signed by: Gearldine Bienenstock, MD

## 2017-05-12 NOTE — ED Nursing Note (Addendum)
Pt reports no recollection of event "until I came to". C/o pain to top of Lt head no abrasion seen sl swelling. Seizure pads in place d/t pts seizure hx

## 2017-05-12 NOTE — ED Nursing Note (Signed)
Pt leaves the ED wheelchair with family at bedside. Fiance called family for ride. Pt to lobby to await ride.

## 2017-05-12 NOTE — ED Nursing Note (Signed)
Pt requesting to use BR before taking to CT, requested PCA to assist with bedpan

## 2017-05-12 NOTE — ED Nursing Note (Signed)
Informed Dr. Heath Gold of pts [facility] with recollection and of c-collar applied

## 2017-05-12 NOTE — ED Triage Note (Signed)
Pt reports a POTS flare up this am, fell and hit her head on door in bathroom, pt has no recall of the event. Friend at bedside reports + LOC. Small laceration to left eyebrow, bleeding controlled Pt seen in ED yesterday for seizures. Pt reports weakness, tremulous in triage.

## 2017-05-12 NOTE — ED Nursing Note (Signed)
Dr. Jagdeo at bedside.

## 2017-05-12 NOTE — ED Nursing Note (Signed)
C-collar applied pt c/o 6/10 C-spine pain with palpation to area

## 2017-05-12 NOTE — ED Nursing Note (Signed)
Patient discharged from ED with AVS reviewed with pt and fiance at bedside, no new Rxs, related instructions and all belongings. Patient is in NAD, is awake/alert skin, is pink/warm/dry.

## 2017-05-12 NOTE — ED Nursing Note (Signed)
Pt requesting to s/w Dr. Heath Gold. This RN stayed in room during discussion. Pt reports has not eaten for a week and JT fell out 2 days ago. Again reiterated importance of f/u with PCP. Fiance reoirts pt sees [employer] [phone]. Has land line in room and provided # to call while awaitng for ride

## 2017-05-12 NOTE — ED Nursing Note (Addendum)
Discussed with pt importance of calling PCP and getting referral for JT insertion. Fiance states pt has PCP and will follow up. Reviewed with pt monitoring Rt wrist site where pt reported previous IV cannula tip broke off for redness or streaks and return or see PCP if worsens. Removed C-collar per request of pt neck CT neg  and given to fiance per request of fiance to have at home.

## 2017-05-15 ENCOUNTER — Encounter: Payer: Self-pay | Admitting: Emergency Medicine

## 2017-05-31 ENCOUNTER — Emergency Department
Admission: EM | Admit: 2017-05-31 | Discharge: 2017-05-31 | Disposition: A | Discharge status: Home / Self Care | Attending: Emergency Medicine | Admitting: Emergency Medicine

## 2017-05-31 DIAGNOSIS — F411 Generalized anxiety disorder: Secondary | ICD-10-CM | POA: Diagnosis present

## 2017-05-31 DIAGNOSIS — F458 Other somatoform disorders: Principal | ICD-10-CM

## 2017-05-31 DIAGNOSIS — F4311 Post-traumatic stress disorder, acute: Secondary | ICD-10-CM

## 2017-05-31 DIAGNOSIS — F4310 Post-traumatic stress disorder, unspecified: Secondary | ICD-10-CM | POA: Diagnosis present

## 2017-05-31 DIAGNOSIS — F419 Anxiety disorder, unspecified: Secondary | ICD-10-CM

## 2017-05-31 DIAGNOSIS — F445 Conversion disorder with seizures or convulsions: Secondary | ICD-10-CM | POA: Insufficient documentation

## 2017-05-31 LAB — POC GLUCOSE: POC GLUCOSE: 105 mg/dL — AB (ref 70–99)

## 2017-05-31 MED ORDER — METOCLOPRAMIDE 5 MG/5 ML ORAL SOLUTION
10.0000 mg | Freq: Once | ORAL | Status: AC
Start: 2017-05-31 — End: 2017-05-31
  Administered 2017-05-31: 10 mg via JEJUNOSTOMY
  Filled 2017-05-31: qty 10

## 2017-05-31 NOTE — ED Nursing Note (Signed)
Pt states "You need to be careful with me because i'm an aspiration risk". Therapeutic communication utilized and  concerns forwarded to MD.

## 2017-05-31 NOTE — ED Nursing Note (Signed)
Pt states "I feel nauseous all the time". Mother resting at bedside. Pt reports taking phenergan & zofran today. MD Tawni Carnes notified. New orders pending.

## 2017-05-31 NOTE — ED Nursing Note (Signed)
Patient discharged from ED with AVS, Rx, related instructions and all belongings. Patient is in NAD, is awake/alert skin, is pink/warm/dry. Pt leaves the ED ambulatory with family.    Pt encouraged to return w/new or worsening symptoms. Instructed to follow up w/PCP and continue home medications. Pt provided gauze & tape to re-dress per self per pt request. Mother-in-law and fiance at bedside.

## 2017-05-31 NOTE — ED Initial Note (Signed)
EMERGENCY DEPARTMENT PHYSICIAN NOTE - Ramiah Maxcy       Date of Service:   05/31/2017  5:33 PM Patient's PCP: No Pcp Per Patient   Note Started: 05/31/2017 17:43 DOB: [birth year]             Chief Complaint   Patient presents with    Active Seizures           The history provided by the patient and relative.  Interpreter used: No    Chloe Barron is a 21yr old female, with a past medical history significant for extensive past medical history including seizures and psychogenic nonepileptic seizures, who presents to the ED with a chief complaint of seizures that began while she was on her way in here today.  The episode was very prolonged and there is no postictal.  Whatsoever.  Patient also had one witnessed seizure here in the emergency department where she was responsive to her environment and myself and woke up immediately afterwards asking if she just had another seizure.  Patient was on her way here today already because her significant other was coming in to be a patient.  Patient describes her episode of seizure as a generalized tonic-clonic seizure starting on the road and lasting for approximately 20 minutes.  She was aware of her environment during the generalized bilateral tonic-clonic seizure but she reports that she was unable to interact with it.  She denies any headache loss of bowel or bladder control or saddle anesthesia.  She has been compliant with all her medications and had no changes in the recently.  She feels this may have been 1 of her nonepileptic seizures and notes that oftentimes they occur at times of stress or any time she is on her way into the hospital with her significant other.  She cannot fully explain why that would be the case..         A full history, including pertinent past medical and social history was reviewed and updated as necessary.    HISTORY:  There are no active hospital problems to display for this patient.   Allergies   Allergen Reactions    Amoxicillin Hives     Amoxicillin Hives and Shortness of Breath    Asa [Aspirin] Unknown-Explain in Comments     bleeding    Aspirin Other-Reaction in Comments     Bleeding risk    Basiliximab Angioedema    Basiliximab Hives    Betadine [Povidone-Iodine] Hives    Cheese Nausea/Vomiting    Docusate Anaphylaxis    Docusate Sodium Anaphylaxis    Doxycycline Rash    Doxycycline Hives and Nausea/Vomiting    Garlic Anaphylaxis    Garlic Anaphylaxis     Reported as per patient    Penicillin Hives    Penicillin Hives    Shellfish Containing Products Anaphylaxis    Shellfish Containing Products Anaphylaxis    Sweet Potato Hives    Sweet Potato Hives    Vancomycin Rash      Past Medical History:  04/11/2017: Asthma  No date: Asthma  No date: Chronic pain  No date: Conductive hearing loss  04/11/2017: Diamond-Blackfan anemia  No date: Diamond-Blackfan anemia  No date: Epilepsy  No date: Essential hypertension  No date: Gastroparesis  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)  04/11/2017: Hypertension  No date: Osteoporosis  No date: Ovarian failure  No date: POTS (postural orthostatic tachycardia syndrom*  No date: Protein calorie malnutrition  No date: Pseudoseizure  No  date: PTSD (post-traumatic stress disorder)  04/11/2017: Seizure disorder  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 Past Surgical History:  05/26/2014: Insertion, tympanostomy tube  No date: Jejunostomy tube   Social History    Marital status: SINGLE              Spouse name:                       Years of education:                 Number of children:               Occupational History    None on file    Social History Main Topics    Smoking status: Never Smoker                                                                   Smokeless status: Not on file                       Alcohol use: No              Drug use: No              Sexual activity: Not on file          Other Topics            Concern    None on file    Social History Narrative    ** Merged  History Encounter **         ** Merged History Encounter **         ** Merged History Encounter **          No family history on file.             Review of Systems   Constitutional: Negative for chills, fatigue and fever.   HENT: Negative for congestion, rhinorrhea and sore throat.    Eyes: Negative for pain, discharge and visual disturbance.   Respiratory: Negative for cough and shortness of breath.    Cardiovascular: Negative for chest pain and palpitations.   Gastrointestinal: Negative for abdominal pain, constipation, diarrhea, nausea and vomiting.   Genitourinary: Negative for dysuria, frequency and urgency.   Musculoskeletal: Negative for back pain, myalgias and neck pain.   Skin: Negative for rash and wound.   Neurological: Positive for seizures. Negative for syncope, weakness and headaches.   Psychiatric/Behavioral: Positive for agitation and sleep disturbance. Negative for confusion, dysphoric mood, hallucinations, self-injury and suicidal ideas. The patient is nervous/anxious. The patient is not hyperactive.    All other systems reviewed and are negative.      TRIAGE VITAL SIGNS:  Temp: 36.3 C (97.3 F) (05/31/17 1740)  Temp src: Oral (05/31/17 1740)  Pulse: 96 (05/31/17 1740)  BP: 115/72 (05/31/17 1740)  Resp: 16 (05/31/17 1740)  SpO2: 98 % (05/31/17 1740)  Weight: 34 kg (75 lb) (05/31/17 1740)    Physical Exam   Constitutional: She is oriented to person, place, and time. She appears well-developed and well-nourished.   HENT:   Head: Normocephalic and atraumatic.   Eyes: EOM are normal. Right eye exhibits no discharge. Left eye exhibits no discharge.  Neck: Normal range of motion. Neck supple.   Cardiovascular: Normal rate and normal heart sounds.   Pulmonary/Chest: Effort normal and breath sounds normal.   Abdominal: Soft. Bowel sounds are normal. She exhibits no mass. There is no tenderness. There is no rebound and no guarding.   Musculoskeletal: Normal range of motion.   Neurological: She is alert  and oriented to person, place, and time. No cranial nerve deficit.   Skin: Skin is warm and dry.   Psychiatric: She has a normal mood and affect. Thought content normal.   Nursing note and vitals reviewed.        INITIAL ASSESSMENT & PLAN, MEDICAL DECISION MAKING, ED COURSE  Chloe Barron is a 43yr female who presents with a chief complaint of seizure-like activity.     Differential includes, but is not limited to: Pseudoseizures, malingering, factitious disorder, conversion disorder, attention seeking behaviors, seizure      The results of the ED evaluation were notable for the following:          Chart Review: I reviewed the patient's prior medical records. Pertinent information that is relevant to this encounter previous history, evaluations.      Patient Summary: After long discussion with the patient as well as evaluation current condition I did not see any need for laboratory tests.  Patient agreed that this likely was a psychogenic nonepileptic seizure does not seem to have great comprehension of how to prevent them or what workup needs to be done for it or really why she is having them.  I think a great deal of education needs to occur the patient has some difficulties with learning at her baseline but I think going over each time she comes and may have some benefit or more preferably she start seeing her doctor outpatient or seen a counselor regularly.  Patient remained stable throughout stay here in the ED. PT had no new or worse problems and was ready to go home at the time of discharge home. Though I suspect no acutely life threatening condition, I did recommend patient return for any new or worse problems. PT was advised to follow up with PCP for further evaluation as soon as reasonable. Patient is aware their evaluation was not complete and primarily acute issues were evaluated and there is still a possibility of chronic issues requiring further evaluation.           LAST VITAL SIGNS:  Temp: 36.3 C  (97.3 F) (05/31/17 1740)  Temp src: Oral (05/31/17 1740)  Pulse: 96 (05/31/17 1740)  BP: 115/72 (05/31/17 1740)  Resp: 16 (05/31/17 1740)  SpO2: 98 % (05/31/17 1740)  Weight: 34 kg (75 lb) (05/31/17 1740)      Clinical Impression:   Problem List Items Addressed This Visit        Neuropsychological    * (Principal) Psychogenic nonepileptic seizure - Primary    PTSD (post-traumatic stress disorder)       Other    Anxiety state                    Disposition: Discharge. Follow up with pcp. ED discharge instructions were reviewed and provided.      PATIENT'S GENERAL CONDITION:  Good: Vital signs are stable and within normal limits. Patient is conscious and comfortable. Indicators are excellent.     Electronically signed by: Brain Hilts, MD

## 2017-05-31 NOTE — ED Nursing Note (Signed)
MD Torgerson at bedside.

## 2017-05-31 NOTE — ED Triage Note (Signed)
Friends mother brought pt in after having a seizure lasting aprox . Hx epilepsy. Pt currently a&o x4.

## 2017-05-31 NOTE — ED Nursing Note (Addendum)
Pt brought in today for seizure  Lasting 20 mins per family at bedside. Hx of seizures. Last seizure yesterday, pt states "typically I have them every 3 days".  AO x4, alert and talking w/staff.     J tube in place- placed by [employer]  17 days ago per pt.

## 2017-05-31 NOTE — ED Nursing Note (Signed)
Healing lac noted to R wrist. Old steri strips in place. Per pt. IV cannula "came off inside me and they had to retreive it".

## 2017-05-31 NOTE — ED Nursing Note (Signed)
Pt fiance at bedside w/mother in law. Pt fiance just discharged from adjacent room. Fiance stated "I knew Gaynell Face wasn't going to give [place] any meds".

## 2017-06-13 ENCOUNTER — Other Ambulatory Visit: Payer: Self-pay

## 2017-06-15 ENCOUNTER — Other Ambulatory Visit: Payer: Self-pay

## 2017-06-20 ENCOUNTER — Other Ambulatory Visit: Payer: Self-pay

## 2017-06-21 ENCOUNTER — Other Ambulatory Visit: Payer: Self-pay

## 2018-01-11 ENCOUNTER — Other Ambulatory Visit: Payer: Self-pay

## 2018-01-11 ENCOUNTER — Observation Stay
Admission: EM | Admit: 2018-01-11 | Discharge: 2018-01-12 | Discharge status: Against Medical Advice | Payer: Medicaid - Out of State | Attending: Specialist | Admitting: Specialist

## 2018-01-11 ENCOUNTER — Ambulatory Visit
Admission: EM | Admit: 2018-01-11 | Discharge: 2018-01-11 | Disposition: A | Discharge status: Other Acute Inpatient Hospital | Payer: Medicaid - Out of State | Source: Home / Self Care | Attending: Family Medicine | Admitting: Family Medicine

## 2018-01-11 DIAGNOSIS — F445 Conversion disorder with seizures or convulsions: Secondary | ICD-10-CM

## 2018-01-11 DIAGNOSIS — Z7951 Long term (current) use of inhaled steroids: Secondary | ICD-10-CM | POA: Diagnosis not present

## 2018-01-11 DIAGNOSIS — D6101 Constitutional (pure) red blood cell aplasia: Secondary | ICD-10-CM | POA: Insufficient documentation

## 2018-01-11 DIAGNOSIS — T85528A Displacement of other gastrointestinal prosthetic devices, implants and grafts, initial encounter: Secondary | ICD-10-CM | POA: Diagnosis not present

## 2018-01-11 DIAGNOSIS — Y733 Surgical instruments, materials and gastroenterology and urology devices (including sutures) associated with adverse incidents: Secondary | ICD-10-CM | POA: Diagnosis not present

## 2018-01-11 DIAGNOSIS — H9193 Unspecified hearing loss, bilateral: Secondary | ICD-10-CM | POA: Diagnosis not present

## 2018-01-11 DIAGNOSIS — Z880 Allergy status to penicillin: Secondary | ICD-10-CM | POA: Insufficient documentation

## 2018-01-11 DIAGNOSIS — J45909 Unspecified asthma, uncomplicated: Secondary | ICD-10-CM | POA: Diagnosis not present

## 2018-01-11 DIAGNOSIS — R6889 Other general symptoms and signs: Secondary | ICD-10-CM

## 2018-01-11 DIAGNOSIS — R0902 Hypoxemia: Secondary | ICD-10-CM

## 2018-01-11 DIAGNOSIS — R569 Unspecified convulsions: Secondary | ICD-10-CM

## 2018-01-11 DIAGNOSIS — F4310 Post-traumatic stress disorder, unspecified: Secondary | ICD-10-CM

## 2018-01-11 DIAGNOSIS — G40802 Other epilepsy, not intractable, without status epilepticus: Secondary | ICD-10-CM | POA: Diagnosis not present

## 2018-01-11 DIAGNOSIS — Z931 Gastrostomy status: Secondary | ICD-10-CM

## 2018-01-11 DIAGNOSIS — G129 Spinal muscular atrophy, unspecified: Secondary | ICD-10-CM | POA: Diagnosis not present

## 2018-01-11 DIAGNOSIS — Z431 Encounter for attention to gastrostomy: Secondary | ICD-10-CM

## 2018-01-11 HISTORY — DX: Constitutional (pure) red blood cell aplasia: D61.01

## 2018-01-11 HISTORY — DX: Epilepsy, unspecified, not intractable, without status epilepticus: G40.909

## 2018-01-11 HISTORY — DX: Spinal muscular atrophy, unspecified: G12.9

## 2018-01-11 LAB — CBC
HCT: 38.2 % (ref 35.0–47.0)
Hemoglobin: 13.3 g/dL (ref 12.0–16.0)
MCH: 30.7 pg (ref 26.0–34.0)
MCHC: 34.8 g/dL (ref 32.0–36.0)
MCV: 88.2 fL (ref 80.0–100.0)
Platelets: 220 10*3/uL (ref 150–440)
RBC: 4.33 MIL/uL (ref 3.80–5.20)
RDW: 13.5 % (ref 11.5–14.5)
WBC: 5.8 10*3/uL (ref 3.6–11.0)

## 2018-01-11 LAB — BASIC METABOLIC PANEL
Anion gap: 8 (ref 5–15)
BUN: 10 mg/dL (ref 6–20)
CO2: 24 mmol/L (ref 22–32)
Calcium: 8.9 mg/dL (ref 8.9–10.3)
Chloride: 105 mmol/L (ref 101–111)
Creatinine, Ser: 0.77 mg/dL (ref 0.44–1.00)
GFR calc Af Amer: >60 mL/min (ref >60)
GFR calc non Af Amer: >60 mL/min (ref >60)
Glucose, Bld: 87 mg/dL (ref 65–99)
Potassium: 3.6 mmol/L (ref 3.5–5.1)
Sodium: 137 mmol/L (ref 135–145)

## 2018-01-11 LAB — HCG, QUANTITATIVE, PREGNANCY: HCG, BETA CHAIN, QUANT, S: 1 m[IU]/mL (ref <5)

## 2018-01-11 LAB — GLUCOSE, CAPILLARY: Glucose-Capillary: 94 mg/dL (ref 65–99)

## 2018-01-11 MED ORDER — MORPHINE SULFATE (PF) 2 MG/ML IV SOLN
2.0000 mg | Freq: Once | INTRAVENOUS | Status: AC
  Administered 2018-01-11: 2 mg via INTRAVENOUS
  Filled 2018-01-11: qty 1

## 2018-01-11 MED ORDER — MORPHINE SULFATE (PF) 2 MG/ML IV SOLN
INTRAVENOUS | Status: AC
  Administered 2018-01-11: 2 mg via INTRAVENOUS
  Filled 2018-01-11: qty 1

## 2018-01-11 MED ORDER — DIPHENHYDRAMINE HCL 50 MG/ML IJ SOLN
12.5000 mg | Freq: Once | INTRAMUSCULAR | Status: AC
  Administered 2018-01-12: 12.5 mg via INTRAVENOUS
  Filled 2018-01-11: qty 1

## 2018-01-11 MED ORDER — LEVETIRACETAM IN NACL 1000 MG/100ML IV SOLN
1000.0000 mg | Freq: Once | INTRAVENOUS | Status: AC
  Administered 2018-01-11: 1000 mg via INTRAVENOUS
  Filled 2018-01-11: qty 100

## 2018-01-11 MED ORDER — SODIUM CHLORIDE 0.9 % IV BOLUS
1000.0000 mL | Freq: Once | INTRAVENOUS | Status: AC
  Administered 2018-01-11: 1000 mL via INTRAVENOUS

## 2018-01-11 MED ORDER — SODIUM CHLORIDE 0.9 % IV SOLN: 20.0000 mg/kg | Freq: Once | INTRAVENOUS | Status: DC

## 2018-01-11 NOTE — ED Triage Notes (Signed)
Patient is unable to get her epipen filled at the pharmacy and would like to get a prescription for epinephrine in a vial.

## 2018-01-11 NOTE — ED Provider Notes (Signed)
[facility] ____________________________________________  Time seen: Approximately 815 PM  I have reviewed the triage vital signs and the nursing notes.   HISTORY  Chief Complaint Medication Refill  HPI Ann Alexander is a 22 y.o. female per nursing staff presented with caregiver for request of epinephrine vial solution, to be drawn when needed, as a reports she has maxed out her EpiPen receipt for this month from her insurance .Medical history and chief complaint review limited by further below activity. See nursing documentation.   Per staff patient requested to go to the restroom and on her way out of the bathroom it was witnessed by [place], CMA, the patient lowered herself to the floor at which time she began having seizure-like activity.  Caregiver Allie Dimmer reported patient has pseudo seizures and she will lay herself down when senses them, and states this is what occurred.     medical history (limited in obtaining)  Epilepsy POTS "Autonomic issues" Pseudoseizures   active problems As above (limited)    No current facility-administered medications for this encounter.  No current outpatient medications on file.  Facility-Administered Medications Ordered in Other Encounters:  .  morphine 2 MG/ML injection 2 mg, 2 mg, Intravenous, Once, [place], [place], MD .  sodium chloride 0.9 % bolus 1,000 mL, 1,000 mL, Intravenous, Once, Don Perking, [place], MD, Last Rate: 1,935 mL/hr at 01/11/18 2157, 1,000 mL at 01/11/18 2157  Allergies Amoxicillin; Bee venom; Docusate sodium; Fire Wellsite geologist; Garlic; Penicillins; Shellfish allergy; Adhesive [tape]; Aspirin; Basiliximab; Betadine [povidone iodine]; Doxycycline; Grass extracts [gramineae pollens]; Lactose intolerance (gi); and Vancomycin   family history Unable to obtain during activity  Social History Social History   Tobacco Use  . Smoking status: Not on file  Substance Use Topics  . Alcohol use: Not on file  . Drug use:  Not on file    Review of Systems Unable to complete Gastrointestinal: positive for abdominal pain post feeding tube pulled out  ____________________________________________   PHYSICAL EXAM:  VITAL SIGNS:see nurses notes.  ED Triage Vitals  Enc Vitals Group     BP --      Pulse --      Resp --      Temp --      Temp src --      SpO2 --      Weight 01/11/18 1905 100 lb 12.8 oz (45.7 kg)     Height --      Head Circumference --      Peak Flow --      Pain Score 01/11/18 1904 0     Pain Loc --      Pain Edu? --      Excl. in GC? --    Exam limited by activity.   Constitutional: nonresponsive to verbal or tactile stimulation. Eyes: Conjunctivae are normal. Fixed upwards gaze bilaterally. ENT      Head: Normocephalic and atraumatic. Cardiovascular: Tachycardia. Grossly otherwise normal heart sounds.  Good peripheral circulation. Respiratory: Tachypnea. No wheezes, rales, rhonchi. Gastrointestinal: Soft. Left abdomen gastrostomy tube site present, no drainage, tube not present. Neurologic: Bilateral upper and lower extremities fully extended with clenched fist and plantar flexed feet, with tonic-clonic generalized activity as well as sustained tightening of jaw. Skin:  Skin is warm, dry.  ___________________________________________   LABS (all labs ordered are listed, but only abnormal results are displayed)  Labs Reviewed - No data to display   Glucose by EMS 78. ____________________________________________  RADIOLOGY  No results found. ____________________________________________   PROCEDURES  Procedures   INITIAL IMPRESSION / ASSESSMENT AND PLAN / ED COURSE  Pertinent labs & imaging results that were available during my care of the patient were reviewed by me and considered in my medical decision making (see chart for details).  As above description.  Initial contact with patient was with patient lying on floor outside of bathroom actually engaged in   tonic-clonic seizure-like activity, after staff called for provider.  Patient was lying on her left side with tonic-clonic seizure-like activity with noted fully dislodged gastrostomy tube with active tube feeds running.  Caregiver was immediately at patient's side and after approximately 1 to 2 minutes of seizure-like activity the activity then ceased,  And patient quickly became comprehensive as well as alert and oriented.  During seizure-like activity staff immediately obtained further care assistance,as well as began assessing patient's vitals.  See nurse's note.  After patient became comprehensive and alert and oriented, she was then assisted into a wheelchair to transport back to examination room for further triage, began assessment and to discuss presenting request.  Patient was able to help stand and position self into wheelchair.  Patient at this point did report abdominal pain from gastrostomy feed site.  Caregiver stepped out to go to car to get patient's wheelchair with seatbelt.  Provider also stepped out of room, as patient was alert oriented and joking with staff.  Patient while sitting in wheelchair began to have repeat seizure-like activity with nursing staff in room.  Staff then assisted nurse to transfer patient from wheelchair to exam table at which point tonic-clonic seizure-like activity continued.  Caregiver then at bedside.  Caregiver continue to state that patient's current symptoms were pseudoseizures and not epileptic seizures.  Immediately discussed with caregiver as patient was unresponsive and continues to have tonic-clonic seizure-like activity recommend intervention, and EMS notification occurred.  Patient was immediately placed on nonrebreather, patient O2 saturation on room air showed at 74% and with nonrebreather 94%.  Pulse ox monitoring continued as well as patient been placed on portable EKG for monitoring.  Caregiver continued to state that current activity was consistent  with patient pseudoseizures.  Patient however continued with multiple back to back tonic-clonic seizure-like episodes lasting from approximately 3 to 5 minutes.    With EMS now at bedside.  Continue to discuss with caregiver and recommend immediate intervention, however caregiver stated she did not want any intervention to occur as current activity were pseudoseizures and not epileptic seizures.  Counseled regarding patient hypoxic episodes as well as sustained tonic-clonic activity consistent with epileptic seizure and concern of threat to life.  Caregiver stated she is the patient's healthcare power of attorney and then presented documentation to support this which was also reviewed by medical team staff.  No antiepileptic medications available at urgent care facility, EMS did have Versed as an option.  EMS Thayer Ohm, recommend to caregiver Versed at this time and caregiver refused. Recommended for patient caregiver to follow her current protocol at bedside, and caregiver states she usually uses hydroxyzine through feeding tube and unable to currently use, as well as she did not want to use her Valium with current activity.  Staff continued to monitor patient vitals with oxygenation saturation remaining in the mid 90s on nonrebreather, with heart rate on EKG varying sinus tach 110-135bpm, while attempting to obtain IV access which was consented by caregiver.  Medical staff at bedside included L. Hyacinth Meeker NP, Dr. Tommy Rainwater, Heather RN, Wandra Mannan, and EMS staff.   Patient caregiver refused  EMS transport as well as EMS intervention.  Caregiver stating she wanted help getting patient to the car at which point she stated the activity would then stop.  Discussed patient not safe to transport. EMS was able to obtain peripheral IV access. Caregiver continued to refuse EMS transport stating "I am going to [facility] her."  Caregiver at this point requests prescription of epinephrine vial to be self drawn up by patient in case of  episode of anaphylaxis, as she has maxed out receipt of EpiPen's from her insurance for this month.  Cautioned caregiver due to danger of accidental overdose of epinephrine, and this prescription would not be provided by this facility as not standard of our care. This was verified also with Dr. Adriana Simas.   Patient tonic-clonic activity consisted of sustained upward bilateral eye fixated gaze with all extremities fully extended with tightened fist and plantar flexed feet, with tonic-clonic generalized activity as well as sustained tightening of jaw.  Patient did have one episode during second seizure-like activity that was witnessed purposeful arm movement, however no response or any other purposeful movement was witnessed during episodes.  Patient further remained nonresponsive during these episodes.  Patient did breakthrough seizure-like activity at which point it was witnessed by Herbert Seta RN, patient was requesting to then be transferred by EMS to hospital for further care. Caregiver then verbalized that patient wishes to be transported by EMS for further care, and gave consent for care transport.  Per EMS, patient was able to sign agreements for transport. No medications given in urgent care.   During transport to EMS Midwife, patient tonic-clonic activity again recurred.  Patient being transferred to emergency traffic [facility].  Raquel RN charge nurse called and given report.  Patient seizure activity appeared consistent with epileptic seizures. ____________________________________________   FINAL CLINICAL IMPRESSION(S) / ED DIAGNOSES  Final diagnoses:  Seizures (HCC)  Hypoxic episode  Dislodged gastrostomy tube [facility])     ED Discharge Orders    None       Note: This dictation was prepared with Dragon dictation along with smaller phrase technology. Any transcriptional errors that result from this process are unintentional.         Renford Dills, NP 01/11/18  2218

## 2018-01-11 NOTE — ED Provider Notes (Signed)
-----------------------------------------   11:50 PM on 01/11/2018 -----------------------------------------  I spoke by phone with Dr. Loreta Ave with interventional radiology to discuss the case and make sure the procedure would be able to happen in the morning.  He took down the name and date of birth of the patient and will pass along to the daytime team the plan.  However Dr. Loreta Ave explained that they will not be able to tell us exactly when the procedure will happen because they do not have control over the vascular lab.  He said it may be into the afternoon before they can do the procedure.  Given the potential for 12+ hours in the emergency department, I think it is more reasonable to observe the patient in the hospital while she awaits the procedure.  I discussed the case with Dr. Sheryle Hail with hospitalist service and he agreed to make her in hospital observation.  He will see her shortly and put in orders.  The order for the IR tube replacement is in.     Ann Rose, MD 01/11/18 [phone]

## 2018-01-11 NOTE — ED Notes (Addendum)
Patient was coming out of the bathroom and started to lie down on the floor and started having a seizure.  This was witnessed by our CMA.  Renford Dills, NP assessed patient and patient's caregiver was beside her.  Her BP after seizure was 119/70 HR 81 O2 sat 100% on RA during her seizure her O2 sat was around 76-81% on RA.  Patient was transferred to the wheelchair and taken to the exam room to continue her triage and assessment. Upon returning to the exam room she started to have another seizure and then was transferred to the exam table.  EMS was called to transfer patient to ED.  Patient had several other seizures prior and when EMS arrived and during transfer to EMS stretcher. Patient signed that she wanted to to go to the hospital and this was also witness by her caregiver who agreed that she wanted to go to the hospital. Patient then transferred to [facility] by EMS.

## 2018-01-11 NOTE — ED Triage Notes (Signed)
Per ems pt from urgent care. Pt was went there for refill of epi pen but wanted a vial of epi not the pen. After she was refused this request she went into a seizure. Hx of pseudo seizure and seizures. Given 4 of versed enroute and 2 of ativan on arrival
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
## 2018-01-12 ENCOUNTER — Observation Stay: Payer: Medicaid - Out of State

## 2018-01-12 ENCOUNTER — Encounter: Payer: Self-pay | Admitting: Internal Medicine

## 2018-01-12 ENCOUNTER — Observation Stay (HOSPITAL_BASED_OUTPATIENT_CLINIC_OR_DEPARTMENT_OTHER): Payer: Medicaid - Out of State

## 2018-01-12 ENCOUNTER — Encounter
Admission: EM | Discharge status: Against Medical Advice | Payer: Self-pay | Source: Home / Self Care | Attending: Emergency Medicine

## 2018-01-12 ENCOUNTER — Observation Stay: Payer: Medicaid - Out of State | Admitting: Registered Nurse

## 2018-01-12 DIAGNOSIS — F445 Conversion disorder with seizures or convulsions: Secondary | ICD-10-CM | POA: Diagnosis not present

## 2018-01-12 DIAGNOSIS — F4310 Post-traumatic stress disorder, unspecified: Secondary | ICD-10-CM

## 2018-01-12 HISTORY — PX: ASPIRATION OF ABSCESS: SHX6754

## 2018-01-12 HISTORY — PX: IR DUODEN/JEJUNO TUBE INSERT PERCUT W/FL MOD SEC: IMG2330

## 2018-01-12 LAB — URINE DRUG SCREEN, QUALITATIVE (ARMC ONLY)
AMPHETAMINES, UR SCREEN: NOT DETECTED
BENZODIAZEPINE, UR SCRN: POSITIVE — AB
Barbiturates, Ur Screen: NOT DETECTED
Cannabinoid 50 Ng, Ur ~~LOC~~: NOT DETECTED
Cocaine Metabolite,Ur ~~LOC~~: NOT DETECTED
MDMA (Ecstasy)Ur Screen: NOT DETECTED
Methadone Scn, Ur: NOT DETECTED
Opiate, Ur Screen: POSITIVE — AB
Phencyclidine (PCP) Ur S: NOT DETECTED
Tricyclic, Ur Screen: NOT DETECTED

## 2018-01-12 LAB — HEMOGLOBIN A1C
Hgb A1c MFr Bld: 4.9 % (ref 4.8–5.6)
Mean Plasma Glucose: 93.93 mg/dL

## 2018-01-12 LAB — URINALYSIS, ROUTINE W REFLEX MICROSCOPIC
Bilirubin Urine: NEGATIVE
GLUCOSE, UA: NEGATIVE mg/dL
Hgb urine dipstick: NEGATIVE
Ketones, ur: NEGATIVE mg/dL
Leukocytes, UA: NEGATIVE
Nitrite: NEGATIVE
PH: 7 (ref 5.0–8.0)
Protein, ur: NEGATIVE mg/dL
Specific Gravity, Urine: 1.008 (ref 1.005–1.030)

## 2018-01-12 LAB — TSH: TSH: 4.348 u[IU]/mL (ref 0.350–4.500)

## 2018-01-12 SURGERY — FLUOROSCOPY GUIDANCE
Anesthesia: Monitor Anesthesia Care

## 2018-01-12 SURGERY — ASPIRATION, ABSCESS
Anesthesia: General

## 2018-01-12 MED ORDER — FENTANYL CITRATE (PF) 100 MCG/2ML IJ SOLN
INTRAMUSCULAR | Status: AC
  Filled 2018-01-12: qty 2

## 2018-01-12 MED ORDER — SODIUM CHLORIDE 0.9 % IV SOLN
500.0000 mg | INTRAVENOUS | Status: DC
  Administered 2018-01-12: 500 mg via INTRAVENOUS
  Filled 2018-01-12: qty 5

## 2018-01-12 MED ORDER — MORPHINE SULFATE (PF) 2 MG/ML IV SOLN
2.0000 mg | INTRAVENOUS | Status: DC | PRN
  Administered 2018-01-12 (×2): 2 mg via INTRAVENOUS
  Filled 2018-01-12: qty 2
  Filled 2018-01-12: qty 1

## 2018-01-12 MED ORDER — MIDAZOLAM HCL 2 MG/2ML IJ SOLN: 2.0000 mg | Freq: Once | INTRAMUSCULAR | Status: DC

## 2018-01-12 MED ORDER — PROPOFOL 10 MG/ML IV BOLUS
INTRAVENOUS | Status: DC | PRN
  Administered 2018-01-12: 30 mg via INTRAVENOUS
  Administered 2018-01-12 (×2): 20 mg via INTRAVENOUS
  Administered 2018-01-12: 10 mg via INTRAVENOUS
  Administered 2018-01-12: 20 mg via INTRAVENOUS

## 2018-01-12 MED ORDER — LIDOCAINE VISCOUS HCL 2 % MT SOLN
OROMUCOSAL | Status: AC
  Filled 2018-01-12: qty 15

## 2018-01-12 MED ORDER — MORPHINE SULFATE (PF) 2 MG/ML IV SOLN
2.0000 mg | Freq: Once | INTRAVENOUS | Status: AC
  Administered 2018-01-12: 01:00:00 2 mg via INTRAVENOUS
  Filled 2018-01-12: qty 1

## 2018-01-12 MED ORDER — MIDAZOLAM HCL 2 MG/2ML IJ SOLN
INTRAMUSCULAR | Status: AC
  Filled 2018-01-12: qty 2

## 2018-01-12 MED ORDER — PROPOFOL 10 MG/ML IV BOLUS
INTRAVENOUS | Status: AC
  Filled 2018-01-12: qty 20

## 2018-01-12 MED ORDER — OSMOLITE 1.5 CAL PO LIQD: 1000.0000 mL | ORAL | Status: DC

## 2018-01-12 MED ORDER — DEXTROSE-NACL 5-0.45 % IV SOLN
INTRAVENOUS | Status: DC
  Administered 2018-01-12 (×3): via INTRAVENOUS

## 2018-01-12 MED ORDER — ONDANSETRON HCL 4 MG PO TABS: 4.0000 mg | ORAL_TABLET | Freq: Four times a day (QID) | ORAL | Status: DC | PRN

## 2018-01-12 MED ORDER — SODIUM CHLORIDE 0.9 % IV SOLN
INTRAVENOUS | Status: DC | PRN
  Administered 2018-01-12: 14:00:00 via INTRAVENOUS

## 2018-01-12 MED ORDER — PROMETHAZINE HCL 25 MG/ML IJ SOLN: 25.0000 mg | Freq: Four times a day (QID) | INTRAMUSCULAR | Status: DC | PRN

## 2018-01-12 MED ORDER — LORAZEPAM 2 MG/ML IJ SOLN
1.0000 mg | INTRAMUSCULAR | Status: DC | PRN
  Administered 2018-01-12 (×2): 1 mg via INTRAVENOUS
  Filled 2018-01-12 (×2): qty 1

## 2018-01-12 MED ORDER — HALOPERIDOL LACTATE 5 MG/ML IJ SOLN
1.0000 mg | INTRAMUSCULAR | Status: DC | PRN
  Administered 2018-01-12: 1 mg via INTRAVENOUS

## 2018-01-12 MED ORDER — ALBUTEROL SULFATE (2.5 MG/3ML) 0.083% IN NEBU: 2.5000 mg | INHALATION_SOLUTION | Freq: Four times a day (QID) | RESPIRATORY_TRACT | Status: DC | PRN

## 2018-01-12 MED ORDER — ONDANSETRON HCL 4 MG/2ML IJ SOLN
4.0000 mg | Freq: Four times a day (QID) | INTRAMUSCULAR | Status: DC | PRN
  Administered 2018-01-12 (×2): 4 mg via INTRAVENOUS
  Filled 2018-01-12 (×2): qty 2

## 2018-01-12 MED ORDER — DRONABINOL 2.5 MG PO CAPS: 5.0000 mg | ORAL_CAPSULE | Freq: Two times a day (BID) | ORAL | Status: DC

## 2018-01-12 MED ORDER — ZOLMITRIPTAN 5 MG NA SOLN: 1.0000 | NASAL | Status: DC | PRN

## 2018-01-12 MED ORDER — IOPAMIDOL (ISOVUE-300) INJECTION 61%
30.0000 mL | Freq: Once | INTRAVENOUS | Status: AC | PRN
  Administered 2018-01-12: 50 mL

## 2018-01-12 MED ORDER — ONDANSETRON HCL 4 MG/2ML IJ SOLN: 4.0000 mg | Freq: Once | INTRAMUSCULAR | Status: DC | PRN

## 2018-01-12 MED ORDER — DICLOFENAC SODIUM 1 % TD GEL
2.0000 g | Freq: Four times a day (QID) | TRANSDERMAL | Status: DC | PRN
  Filled 2018-01-12: qty 100

## 2018-01-12 MED ORDER — MIDAZOLAM HCL 2 MG/2ML IJ SOLN
2.0000 mg | Freq: Once | INTRAMUSCULAR | Status: AC
  Administered 2018-01-12: 2 mg via INTRAVENOUS

## 2018-01-12 MED ORDER — FLUTICASONE FUROATE-VILANTEROL 200-25 MCG/INH IN AEPB
1.0000 | INHALATION_SPRAY | Freq: Every day | RESPIRATORY_TRACT | Status: DC
  Administered 2018-01-12: 1 via RESPIRATORY_TRACT
  Filled 2018-01-12: qty 28

## 2018-01-12 MED ORDER — HALOPERIDOL LACTATE 5 MG/ML IJ SOLN
INTRAMUSCULAR | Status: AC
  Filled 2018-01-12: qty 1

## 2018-01-12 MED ORDER — MIDAZOLAM HCL 2 MG/2ML IJ SOLN
INTRAMUSCULAR | Status: AC
  Administered 2018-01-12: 2 mg via INTRAVENOUS
  Filled 2018-01-12: qty 2

## 2018-01-12 MED ORDER — FENTANYL CITRATE (PF) 100 MCG/2ML IJ SOLN
INTRAMUSCULAR | Status: DC | PRN
  Administered 2018-01-12 (×2): 50 ug via INTRAVENOUS

## 2018-01-12 MED ORDER — MIDAZOLAM HCL 2 MG/2ML IJ SOLN
INTRAMUSCULAR | Status: DC | PRN
  Administered 2018-01-12: 2 mg via INTRAVENOUS

## 2018-01-12 MED ORDER — LEVETIRACETAM IN NACL 500 MG/100ML IV SOLN
500.0000 mg | INTRAVENOUS | Status: DC
  Filled 2018-01-12: qty 100

## 2018-01-12 MED ORDER — KETOROLAC TROMETHAMINE 15 MG/ML IJ SOLN
15.0000 mg | Freq: Four times a day (QID) | INTRAMUSCULAR | Status: DC | PRN
  Administered 2018-01-12: 15 mg via INTRAVENOUS
  Filled 2018-01-12: qty 1

## 2018-01-12 MED ORDER — FENTANYL CITRATE (PF) 100 MCG/2ML IJ SOLN
INTRAMUSCULAR | Status: AC
  Administered 2018-01-12: 25 ug via INTRAVENOUS
  Filled 2018-01-12: qty 2

## 2018-01-12 MED ORDER — ALUM & MAG HYDROXIDE-SIMETH 200-200-20 MG/5ML PO SUSP
ORAL | Status: AC
  Filled 2018-01-12: qty 30

## 2018-01-12 MED ORDER — SCOPOLAMINE 1 MG/3DAYS TD PT72
1.0000 | MEDICATED_PATCH | TRANSDERMAL | Status: DC
  Administered 2018-01-12: 1.5 mg via TRANSDERMAL
  Filled 2018-01-12: qty 1

## 2018-01-12 MED ORDER — HYDROXYZINE HCL 25 MG PO TABS
25.0000 mg | ORAL_TABLET | Freq: Four times a day (QID) | ORAL | Status: DC | PRN
  Filled 2018-01-12: qty 1

## 2018-01-12 MED ORDER — TRAZODONE HCL 50 MG PO TABS
50.0000 mg | ORAL_TABLET | Freq: Every evening | ORAL | Status: DC | PRN
Start: 2018-01-12 — End: 2018-01-13

## 2018-01-12 MED ORDER — FENTANYL CITRATE (PF) 100 MCG/2ML IJ SOLN
25.0000 ug | INTRAMUSCULAR | Status: DC | PRN
  Administered 2018-01-12 (×4): 25 ug via INTRAVENOUS

## 2018-01-12 MED ORDER — OSMOLITE 1.5 CAL PO LIQD: 1000.0000 mL | Freq: Four times a day (QID) | ORAL | Status: DC

## 2018-01-12 MED ORDER — LIDOCAINE HCL (PF) 2 % IJ SOLN
INTRAMUSCULAR | Status: AC
  Filled 2018-01-12: qty 10

## 2018-01-12 MED ORDER — ACETAMINOPHEN 325 MG PO TABS: 650.0000 mg | ORAL_TABLET | Freq: Four times a day (QID) | ORAL | Status: DC | PRN

## 2018-01-12 MED ORDER — MIRTAZAPINE 15 MG PO TABS: 30.0000 mg | ORAL_TABLET | Freq: Every day | ORAL | Status: DC

## 2018-01-12 NOTE — Transfer of Care (Signed)
Immediate Anesthesia Transfer of Care Note  Patient: Dasani Berens  Procedure(s) Performed: feeding tube insertion (N/A )  Patient Location: PACU  Anesthesia Type:General  Level of Consciousness: sedated  Airway & Oxygen Therapy: Patient Spontanous Breathing and Patient connected to face mask oxygen  Post-op Assessment: Report given to RN and Post -op Vital signs reviewed and stable  Post vital signs: Reviewed and stable  Last Vitals:  Vitals Value Taken Time  BP 87/52 01/12/2018  2:28 PM  Temp 36.4 C 01/12/2018  2:28 PM  Pulse 66 01/12/2018  2:28 PM  Resp 10 01/12/2018  2:28 PM  SpO2 100 % 01/12/2018  2:28 PM    Last Pain:  Vitals:   01/12/18 1324  TempSrc: Oral  PainSc:       Patients Stated Pain Goal: 1 (01/12/18 0929)  Complications: No apparent anesthesia complications

## 2018-01-12 NOTE — Anesthesia Preprocedure Evaluation (Signed)
Anesthesia Evaluation  Patient identified by MRN, date of birth, ID band Patient awake    Reviewed: Allergy & Precautions, NPO status , Patient's Chart, lab work & pertinent test results  History of Anesthesia Complications Negative for: history of anesthetic complications  Airway Mallampati: II       Dental   Pulmonary neg sleep apnea, neg COPD,           Cardiovascular (-) hypertension(-) Past MI and (-) CHF (-) dysrhythmias (-) Valvular Problems/Murmurs     Neuro/Psych Seizures - (last one today), Poorly Controlled,     GI/Hepatic Neg liver ROS, neg GERD  ,  Endo/Other  neg diabetes  Renal/GU negative Renal ROS     Musculoskeletal   Abdominal   Peds  Hematology  (+) anemia ,   Anesthesia Other Findings   Reproductive/Obstetrics                             Anesthesia Physical Anesthesia Plan  ASA: III  Anesthesia Plan: General   Post-op Pain Management:    Induction: Intravenous  PONV Risk Score and Plan: TIVA and Propofol infusion  Airway Management Planned: Nasal Cannula  Additional Equipment:   Intra-op Plan:   Post-operative Plan:   Informed Consent: I have reviewed the patients History and Physical, chart, labs and discussed the procedure including the risks, benefits and alternatives for the proposed anesthesia with the patient or authorized representative who has indicated his/her understanding and acceptance.     Plan Discussed with:   Anesthesia Plan Comments:         Anesthesia Quick Evaluation

## 2018-01-12 NOTE — Consult Note (Signed)
Reason for Consult:Seizures Referring Physician: Mody  CC: Seizure  HPI: Ann Alexander is an 22 y.o. female with a documented history of nonepileptic seizures who reports that she is here from [place] visiting.  Was noted to have a seizure at urgent care.  EMS was called and administered Ativan and Versed.  Patient had another event in ED as well.  With seizure activity patient dislodged her gastric tube and is admitted for its reinsertion.    Past Medical History:  Diagnosis Date  . 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 Epilepsy (HCC)    also pseudoseizures  . Spinal muscular atrophy [facility])     Past Surgical History:  Procedure Laterality Date  . SP PERC PLACE GASTRIC TUBE      Family History  Family history unknown: Yes    Social History:  has no tobacco, alcohol, and drug history on file.  Allergies  Allergen Reactions  . Amoxicillin Anaphylaxis  . Bee Venom Anaphylaxis  . Docusate Sodium Anaphylaxis  . Fire [employer] Anaphylaxis  . Garlic Anaphylaxis  . Penicillins Anaphylaxis  . Shellfish Allergy Anaphylaxis  . Adhesive [Tape] Other (See Comments)  . Aspirin Other (See Comments)  . Basiliximab Hives  . Betadine [Povidone Iodine] Hives  . Doxycycline Nausea And Vomiting  . Grass Extracts [Gramineae Pollens] Hives  . Lactose Intolerance (Gi) Nausea And Vomiting  . Vancomycin Other (See Comments)    Medications:  I have reviewed the patient's current medications. Prior to Admission:  Medications Prior to Admission  Medication Sig Dispense Refill Last Dose  . albuterol (PROVENTIL) (2.5 MG/3ML) 0.083% nebulizer solution Take 2.5 mg by nebulization every 6 (six) hours as needed for wheezing or shortness of breath.   prn  . calcium carbonate (TUMS - DOSED IN MG ELEMENTAL CALCIUM) 500 MG chewable tablet Place 2 tablets into feeding tube daily.    01/11/2018 at Unknown time  . dronabinol (MARINOL) 2.5 MG capsule Take 5 mg by mouth 2 (two) times daily.   01/11/2018 at Unknown time   . EPINEPHrine 0.3 mg/0.3 mL IJ SOAJ injection Inject into the muscle once.   prn  . ergocalciferol (DRISDOL) 8000 UNIT/ML drops Place 4,000 Units into feeding tube daily.    01/11/2018 at Unknown time  . estradiol (CLIMARA - DOSED IN MG/24 HR) 0.1 mg/24hr patch Place 1 patch onto the skin once a week.   Past Week at Unknown time  . Fluticasone-Salmeterol (ADVAIR) 250-50 MCG/DOSE AEPB Inhale 1 puff into the lungs 2 (two) times daily.   01/10/2018 at Unknown time  . hydrOXYzine (ATARAX/VISTARIL) 25 MG tablet Place 25 mg into feeding tube every 6 (six) hours as needed for anxiety (seizure).    prn  . ipratropium-albuterol (DUONEB) 0.5-2.5 (3) MG/3ML SOLN Take 3 mLs by nebulization every 6 (six) hours as needed (wheezing and shortness of breath).   prn  . levETIRAcetam (KEPPRA) 100 MG/ML solution Take 250 mg by mouth 2 (two) times daily.   01/11/2018 at Unknown time  . lidocaine (LIDODERM) 5 % Place 1 patch onto the skin daily.   01/11/2018 at Unknown time  . Melatonin 5 MG TABS Place 10 mg into feeding tube at bedtime as needed (sleep).    01/10/2018 at Unknown time  . mirtazapine (REMERON) 30 MG tablet Place 30 mg into feeding tube at bedtime.    01/10/2018 at Unknown time  . ONETOUCH VERIO test strip 1 strip by In Vitro route daily. hypoglycemia     . oxyCODONE (ROXICODONE) 5 MG/5ML solution  Place 5 mg into feeding tube every 4 (four) hours as needed for severe pain.    prn  . ranitidine (ZANTAC) 150 MG tablet Take 150 mg by mouth 2 (two) times daily.   01/11/2018 at Unknown time  . scopolamine (TRANSDERM-SCOP) 1 MG/3DAYS Place 1 patch onto the skin every 3 (three) days.   01/09/2018 at Unknown time  . SUMAtriptan (IMITREX) 20 MG/ACT nasal spray Place 20 mg into the nose every 2 (two) hours as needed for migraine or headache. May repeat in 2 hours if headache persists or recurs.   prn at prn  . thiamine (VITAMIN B-1) 100 MG tablet Take 100 mg by mouth daily.   01/11/2018 at Unknown time  . traZODone  (DESYREL) 50 MG tablet Place 50 mg into feeding tube at bedtime as needed for sleep.    01/10/2018 at Unknown time  . VOLTAREN 1 % GEL Place 2 g onto the skin 4 (four) times daily as needed for pain.   01/11/2018 at Unknown time  . zolmitriptan (ZOMIG) 5 MG nasal solution Place 1 spray into the nose as needed for migraine.   prn   Scheduled: . fluticasone furoate-vilanterol  1 puff Inhalation Daily    ROS: History obtained from the patient  General ROS: negative for - chills, fatigue, fever, night sweats, weight gain or weight loss Psychological ROS: anxiety Ophthalmic ROS: negative for - blurry vision, double vision, eye pain or loss of vision ENT ROS: deaf Allergy and Immunology ROS: negative for - hives or itchy/watery eyes Hematological and Lymphatic ROS: negative for - bleeding problems, bruising or swollen lymph nodes Endocrine ROS: negative for - galactorrhea, hair pattern changes, polydipsia/polyuria or temperature intolerance Respiratory ROS: negative for - cough, hemoptysis, shortness of breath or wheezing Cardiovascular ROS: negative for - chest pain, dyspnea on exertion, edema or irregular heartbeat Gastrointestinal ROS: negative for - abdominal pain, diarrhea, hematemesis, nausea/vomiting or stool incontinence Genito-Urinary ROS: negative for - dysuria, hematuria, incontinence or urinary frequency/urgency Musculoskeletal ROS: negative for - joint swelling or muscular weakness Neurological ROS: as noted in HPI Dermatological ROS: negative for rash and skin lesion changes  Physical Examination: Blood pressure (!) 90/55, pulse 86, temperature 97.9 F (36.6 C), temperature source Oral, resp. rate 17, weight 45.4 kg (100 lb 2 oz), SpO2 98 %.  HEENT-  Normocephalic, no lesions, without obvious abnormality.  Normal external eye and conjunctiva.  Normal TM's bilaterally.  Normal auditory canals and external ears. Normal external nose, mucus membranes and septum.  Normal  pharynx. Cardiovascular- S1, S2 normal, pulses palpable throughout   Lungs- chest clear, no wheezing, rales, normal symmetric air entry Abdomen- soft, non-tender; bowel sounds normal; no masses,  no organomegaly Extremities- no edema Lymph-no adenopathy palpable Musculoskeletal-no joint tenderness, deformity or swelling Skin-warm and dry, no hyperpigmentation, vitiligo, or suspicious lesions  Neurological Examination  Mental Status: Alert, oriented, thought content appropriate.  Speech fluent but dysarhric.  Signs often but also reads lips. Able to follow 3 step commands without difficulty. Cranial Nerves: II: Discs flat bilaterally; Visual fields grossly normal, pupils equal, round, reactive to light and accommodation III,IV, VI: ptosis not present, extra-ocular motions intact bilaterally V,VII: smile symmetric, facial light touch sensation normal bilaterally VIII: hearing normal bilaterally IX,X: gag reflex present XI: bilateral shoulder shrug XII: midline tongue extension Motor: Right : Upper extremity   5/5    Left:     Upper extremity   5/5  Lower extremity   5/5     Lower extremity  5/5 Tone and bulk:normal tone throughout; no atrophy noted Sensory: Pinprick and light touch intact throughout, bilaterally Deep Tendon Reflexes: 2+ and symmetric with absent AJ's bilaterally Plantars: Right: mute   Left: mute Cerebellar: Normal finger-to-nose and normal heel-to-shin testing bilaterally Gait: not tested due to safety concerns    Laboratory Studies:   Basic Metabolic Panel: Recent Labs  Lab 01/11/18 2054  NA 137  K 3.6  CL 105  CO2 24  GLUCOSE 87  BUN 10  CREATININE 0.77  CALCIUM 8.9    Liver Function Tests: No results for input(s): AST, ALT, ALKPHOS, BILITOT, PROT, ALBUMIN in the last 168 hours. No results for input(s): LIPASE, AMYLASE in the last 168 hours. No results for input(s): AMMONIA in the last 168 hours.  CBC: Recent Labs  Lab 01/11/18 2054  WBC 5.8   HGB 13.3  HCT 38.2  MCV 88.2  PLT 220    Cardiac Enzymes: No results for input(s): CKTOTAL, CKMB, CKMBINDEX, TROPONINI in the last 168 hours.  BNP: Invalid input(s): POCBNP  CBG: Recent Labs  Lab 01/11/18 2052  GLUCAP 94    Microbiology: No results found for this or any previous visit.  Coagulation Studies: No results for input(s): LABPROT, INR in the last 72 hours.  Urinalysis:  Recent Labs  Lab 01/12/18 0931  COLORURINE STRAW*  LABSPEC 1.008  PHURINE 7.0  GLUCOSEU NEGATIVE  HGBUR NEGATIVE  BILIRUBINUR NEGATIVE  KETONESUR NEGATIVE  PROTEINUR NEGATIVE  NITRITE NEGATIVE  LEUKOCYTESUR NEGATIVE    Lipid Panel:  No results found for: CHOL, TRIG, HDL, CHOLHDL, VLDL, LDLCALC  HgbA1C:  Lab Results  Component Value Date   HGBA1C 4.9 01/11/2018    Urine Drug Screen:      Component Value Date/Time   LABOPIA POSITIVE (A) 01/12/2018 0931   COCAINSCRNUR NONE DETECTED 01/12/2018 0931   LABBENZ POSITIVE (A) 01/12/2018 0931   AMPHETMU NONE DETECTED 01/12/2018 0931   THCU NONE DETECTED 01/12/2018 0931   LABBARB NONE DETECTED 01/12/2018 0931    Alcohol Level: No results for input(s): ETH in the last 168 hours.   Imaging: No results found.   Assessment/Plan: 22 year old female with a documented history of nonepileptic seizures.  From review of records has had long term monitoring making diagnosis definitive.  Is on Keppra as an outpatient at  [facility].  Would continue this.  Would not be aggressive about intervention for seizure-like events.      Recommendations: 1.  Would continue home meds with Keppra at  [facility].  May administer IV at this dose until gastric tube replaced and able to be used if unable to take po medications.   2.  Would refrain from benzos as much as possible.   3.  Seizure precautions to ensure patient's safety.    Thana Farr, MD Neurology [phone] 01/12/2018, 11:34 AM
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
## 2018-01-12 NOTE — Progress Notes (Signed)
Per Annice Pih, [facility]: Individual claiming to be POA is allowed to visit with patient accompanied by the service animal. If issues arise with behavior, AC is to be notified. Order placed for telesitter. Bo Mcclintock, RN

## 2018-01-12 NOTE — Progress Notes (Addendum)
Pt.'s POA request to speak to nursing supervisor due to her having questions regarding her care plan. RN has called Armed forces technical officer. Nursing supervisor states she will be able to round on pt and pt.'s POA around [phone] 01/12/2018. RN updated POA.   Ann Alexander [employer]
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
## 2018-01-12 NOTE — Progress Notes (Signed)
Patient briefly seen this morning.  She appears to be drowsy.  She received Ativan and morphine for seizure.  Agree with admitting MD plan  I will also consult neurology and obtain EEG due to seizures. I will also check UA. 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

## 2018-01-12 NOTE — Progress Notes (Addendum)
Initial Nutrition Assessment  DOCUMENTATION CODES:   Not applicable  INTERVENTION:  When J-tube is cleared for use recommend Osmolite 1.5 1/2 can ( ) 4 times daily for 24 hours.   If she tolerates this, recommend increase volume of tubefeeding administered by 50mL at each subsequent feeding until she reaches goal of tolerating 4 times daily  At goal, provides 1420 calories, 60gm protein, and free water  Recommend speech pathology consult for assessment of PO intake  If she is unable to take PO liquids and is not on IV fluids, recommend free water before and after each feeding, providing total free water of  If she is receiving IV fluids or able to consume PO water, recommend 30mL free water before and after each feeding to prevent tube clogging.  NUTRITION DIAGNOSIS:   Inadequate oral intake related to dysphagia, inability to eat as evidenced by NPO status.  GOAL:   Patient will meet greater than or equal to 90% of their needs  MONITOR:   Diet advancement, I & O's, Weight trends  REASON FOR ASSESSMENT:   Consult Enteral/tube feeding initiation and management  ASSESSMENT:   Patient with past medical history of seizure disorder and anemia presents to the emergency department after seizure activity at urgent care.  During the patient's witnessed seizure she dislodged her gastric tube.  RD consulted to see patient for tube-feeding management. Spoke with patient at bedside with aid of her laptop. She is hard of hearing. Has a J tube at baseline. There are a lot of notes in [facility] explaining this but patient has a history of gastroparesis and superior mesenteric artery syndrome. She was unable to tolerate solid food or liquids back in 2018, this has improved some, appears she can tolerate small amounts now. She also has mild oropharyngeal dysphagia per speech pathology note on 12/29/2017, however she was deemed safe for  PO intake. Appears she was taking some PO PTA, but meets her nutrition needs and takes medication via J-tube. Normally uses 2 cans of compleat and 3 cans of nutren 1.5 to meet nutrition needs. Appears she has been bolusing these which is not normally well tolerated but appears patient has been ok. Reports weight fluctuations between 95-100 pounds over the 3 - 5 months.  Patient states she plans to have a gastric pacemaker placed at some point to help with gastroparesis. This may allow her to eat PO at some point and tolerate intake.  Labs reviewed Medications reviewed and include:     NUTRITION - FOCUSED PHYSICAL EXAM:    Most Recent Value  Orbital Region  Mild depletion  Upper Arm Region  No depletion  Thoracic and Lumbar Region  No depletion  Buccal Region  Mild depletion  Temple Region  Mild depletion  Clavicle Bone Region  Mild depletion  Clavicle and Acromion Bone Region  Mild depletion  Scapular Bone Region  No depletion  Dorsal Hand  No depletion  Patellar Region  Mild depletion  Anterior Thigh Region  Mild depletion  Posterior Calf Region  No depletion       Diet Order:   Diet Order           Diet NPO time specified Except for: Sips with Meds  Diet effective now          EDUCATION NEEDS:   Not appropriate for education at this time  Skin:  Skin Assessment: Reviewed RN Assessment  Last BM:  PTA  Height:   Ht Readings from Last 1 Encounters:  01/12/18 5' (1.524 m) (5 %, Z= -1.68)*   * Growth percentiles are based on CDC (Girls, 2-20 Years) data.    Weight:   Wt Readings from Last 1 Encounters:  01/12/18 100 lb 2 oz (45.4 kg) (4 %, Z= -1.81)*   * Growth percentiles are based on CDC (Girls, 2-20 Years) data.    Ideal Body Weight:  45.45 kg  BMI:  Body mass index is 19.55 kg/m.  Estimated Nutritional Needs:   Kcal:  1350-1600 calories  Protein:  60-70 grams (1.3-1.5g/kg)  Fluid:  >1.5L  Dionne Ano. Yassin Scales, MS, RD LDN Inpatient Clinical  Dietitian Pager [phone]
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
## 2018-01-12 NOTE — Plan of Care (Addendum)
Pt was found to be seizing on her arrival to the floor.  Orderly sd she had been seizing for approx 3 minutes.  Rapid response was called.  She had rec'd 1 g Keppra in ED. Had been given 2 mg ativan and 4 mg Versed by EMS.  Called Dr. Sheryle Hail - asked me to test for withdrawal from noxious stimulation - she didn't respond.  He came up to see her.  Pt w/hx of pseudo seizures.  Gave her 1 mg ativan.  When she began to come out of the seizure, she was very tearful.  First she c/o abdominal pain where J-tube had inadvertently been removed during earlier seizure.  2 mg morphine given.  Continued to cry and indicated that she was scared - afraid her parents would come see her.  Discussed with Sherryl Barters - she was aware of situation.  Pt c/o of nausea and wanted benadryl.  Told her that the ativan she received would help with nausea.  Explained that she needed to rest.  Upon recheck, patient was asleep. SR 80's on monitor.

## 2018-01-12 NOTE — Significant Event (Signed)
Rapid Response Event Note  Overview: Time Called: 0044 Event Type: Neurologic  Initial Focused Assessment:Pt just moved from ED stretcher, having seizure like activity. VS WNL.    Interventions:Dr. Sheryle Hail to beside, orders for ativan and Morphine given.   Plan of Care (if not transferred):Will be monitored in room.  Event Summary: Name of Physician Notified: Sheryle Hail at 0044    at    Outcome: Stayed in room and stabalized  Event End Time: 0100  Conception Doebler A

## 2018-01-12 NOTE — Progress Notes (Signed)
Pts DPOA requested pt be signed out AMA.  POA has several complaints about entire visit from the ED to OR, and medications given and not given.  POA spoke with [facility] and left shortly afterwards.  Pt agreed with plan, AMA form signed by both pt and POA, room checked for all belongings and they were sent with the pt.  Said nurse provided therapeutic communication and concern over discharge.

## 2018-01-12 NOTE — Progress Notes (Addendum)
When RN was assessing pt, pt stated "where is the foley catheter that was suppose to be placed where my J-tube was placed?" RN did not see anything inserted where pt once had her J-tube. RN search the notes and told pt that they were not able to place on and that she was getting her J-tube replaced today by radiology. Pt also reassured that radiology will come by and explain the procedure to her prior to. Pt verbalized that she understood. No further questions at this time and MD was made aware.   Jazzalyn Loewenstein [employer]

## 2018-01-12 NOTE — Progress Notes (Signed)
CH responding to rapid page. CH arrived to room 124 and staff were in the room with patient. CH inquired about family. No family present. Patient is traveling with a friend. CH provided silent prayer outside patient's room. CH available for follow-up.

## 2018-01-12 NOTE — Anesthesia Post-op Follow-up Note (Signed)
Anesthesia QCDR form completed.        

## 2018-01-12 NOTE — Progress Notes (Signed)
   01/12/18 0925  Clinical Encounter Type  Visited With Patient  Visit Type Initial  Referral From Chaplain  Consult/Referral To Chaplain  Spiritual Encounters  Spiritual Needs Prayer   CH was asked to visit PT by [facility]. CH attempt 3x to visit PT but PT's level of care prevented CH from entering RM. Each time [facility] visited PT was having a procedure done. PT is deaf, which could make communication difficult without a Nurse, learning disability. CH prayed outside of PT's door.  CH will follow up as needed.

## 2018-01-12 NOTE — Progress Notes (Signed)
Chief Complaint: Patient was seen in consultation today for dislodged jejunostomy tube at the request of Dr. Adrian Saran  Referring Physician(s): *Dr. Adrian Saran*  Supervising Physician: Richarda Overlie  Patient Status: [facility] - In-pt  History of Present Illness: Ann Alexander is a 22 y.o. female with hx of epilepsy and pseudoseizures. She is visiting here from [place] and had to be admitted this am after seizure activity during which her chronic jejunostomy tube became dislodged. She has had this tube for a long time and has had many replacements by the IR team at [facility] in [place], most recently on 4/30. IR is asked to replace her jejunostomy tube while here. The pt is deaf but reads lips well and is able to communicate well through keyboard. PMHx, meds, labs reviewed. Prior procedures reviewed via 'Care Everywhere' Noted that significant sedative/pain medication required for previous procedure.  Past Medical History:  Diagnosis Date  . 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 Epilepsy (HCC)    also pseudoseizures  . Spinal muscular atrophy [facility])     Past Surgical History:  Procedure Laterality Date  . SP PERC PLACE GASTRIC TUBE      Allergies: Amoxicillin; Bee venom; Docusate sodium; Fire [employer]; Garlic; Penicillins; Shellfish allergy; Adhesive [tape]; Aspirin; Basiliximab; Betadine [povidone iodine]; Doxycycline; Grass extracts [gramineae pollens]; Lactose intolerance (gi); and Vancomycin  Medications: Prior to Admission medications   Medication Sig Start Date End Date Taking? Authorizing Provider  albuterol (PROVENTIL) (2.5 MG/3ML) 0.083% nebulizer solution Take 2.5 mg by nebulization every 6 (six) hours as needed for wheezing or shortness of breath.   Yes [provider]  calcium carbonate (TUMS - DOSED IN MG ELEMENTAL CALCIUM) 500 MG chewable tablet Place 2 tablets into feeding tube daily.    Yes [provider]  dronabinol (MARINOL) 2.5 MG capsule Take 5 mg by  mouth 2 (two) times daily.   Yes [provider]  EPINEPHrine 0.3 mg/0.3 mL IJ SOAJ injection Inject into the muscle once.   Yes [provider]  ergocalciferol (DRISDOL) 8000 UNIT/ML drops Place 4,000 Units into feeding tube daily.    Yes [provider]  estradiol (CLIMARA - DOSED IN MG/24 HR) 0.1 mg/24hr patch Place 1 patch onto the skin once a week.   Yes [provider]  Fluticasone-Salmeterol (ADVAIR) 250-50 MCG/DOSE AEPB Inhale 1 puff into the lungs 2 (two) times daily.   Yes [provider]  hydrOXYzine (ATARAX/VISTARIL) 25 MG tablet Place 25 mg into feeding tube every 6 (six) hours as needed for anxiety (seizure).    Yes [provider]  ipratropium-albuterol (DUONEB) 0.5-2.5 (3) MG/3ML SOLN Take 3 mLs by nebulization every 6 (six) hours as needed (wheezing and shortness of breath).   Yes [provider]  levETIRAcetam (KEPPRA) 100 MG/ML solution Take 250 mg by mouth 2 (two) times daily.   Yes [provider]  lidocaine (LIDODERM) 5 % Place 1 patch onto the skin daily.   Yes [provider]  Melatonin 5 MG TABS Place 10 mg into feeding tube at bedtime as needed (sleep).    Yes [provider]  mirtazapine (REMERON) 30 MG tablet Place 30 mg into feeding tube at bedtime.    Yes [provider]  [facility] VERIO test strip 1 strip by In Vitro route daily. hypoglycemia   Yes [provider]  oxyCODONE (ROXICODONE) 5 MG/5ML solution Place 5 mg into feeding tube every 4 (four) hours as needed for severe pain.    Yes [provider]  ranitidine (ZANTAC) 150 MG tablet Take 150 mg by mouth 2 (two) times daily.   Yes [provider]  scopolamine (TRANSDERM-SCOP) 1 MG/3DAYS Place 1 patch onto the skin every 3 (three) days.   Yes [provider]  SUMAtriptan (IMITREX) 20 MG/ACT nasal spray Place 20 mg into the nose every 2 (two) hours as needed for migraine or headache. May  repeat in 2 hours if headache persists or recurs.   Yes [provider]  thiamine (VITAMIN B-1) 100 MG tablet Take 100 mg by mouth daily.   Yes [provider]  traZODone (DESYREL) 50 MG tablet Place 50 mg into feeding tube at bedtime as needed for sleep.    Yes [provider]  VOLTAREN 1 % GEL Place 2 g onto the skin 4 (four) times daily as needed for pain.   Yes [provider]  zolmitriptan (ZOMIG) 5 MG nasal solution Place 1 spray into the nose as needed for migraine.   Yes [provider]     Family History  Family history unknown: Yes    Social History   Socioeconomic History  . Marital status: Single    Spouse name: Not on file  . Number of children: Not on file  . Years of education: Not on file  . Highest education level: Not on file  Occupational History  . Not on file  Social Needs  . Financial resource strain: Not on file  . Food insecurity:    Worry: Not on file    Inability: Not on file  . Transportation needs:    Medical: Not on file    Non-medical: Not on file  Tobacco Use  . Smoking status: Not on file  Substance and Sexual Activity  . Alcohol use: Not on file  . Drug use: Not on file  . Sexual activity: Not on file  Lifestyle  . Physical activity:    Days per week: Not on file    Minutes per session: Not on file  . Stress: Not on file  Relationships  . Social connections:    Talks on phone: Not on file    Gets together: Not on file    Attends religious service: Not on file    Active member of club or organization: Not on file    Attends meetings of clubs or organizations: Not on file    Relationship status: Not on file  Other Topics Concern  . Not on file  Social History Narrative  . Not on file    Review of Systems: A 12 point ROS discussed and pertinent positives are indicated in the HPI above.  All other systems are negative.  Review of Systems  Vital Signs: BP (!) 90/55   Pulse 86   Temp  97.9 F (36.6 C) (Oral)   Resp 17   Wt 100 lb 2 oz (45.4 kg)   SpO2 98%   Physical Exam  Constitutional: She is oriented to person, place, and time. She appears well-developed. No distress.  HENT:  Head: Normocephalic.  Mouth/Throat: Oropharynx is clear and moist.  Neck: Normal range of motion. No JVD present. No tracheal deviation present.  Cardiovascular: Normal rate, regular rhythm and normal heart sounds.  Pulmonary/Chest: Effort normal and breath sounds normal. No respiratory distress.  Abdominal: Soft. There is no tenderness.  J-tube site noted, scant drainage  Neurological: She is alert and oriented to person, place, and time.  Skin: Skin is warm and dry.  Psychiatric: She has a normal mood and affect.    Imaging: No results found.  Labs:  CBC: Recent Labs    01/11/18 2054  WBC 5.8  HGB 13.3  HCT 38.2  PLT 220    COAGS: No results for input(s): INR, APTT in the last 8760 hours.  BMP: Recent Labs    01/11/18 2054  NA 137  K 3.6  CL 105  CO2 24  GLUCOSE 87  BUN 10  CALCIUM 8.9  CREATININE 0.77  GFRNONAA >60  GFRAA >60    LIVER FUNCTION TESTS: No results for input(s): BILITOT, AST, ALT, ALKPHOS, PROT, ALBUMIN in the last 8760 hours.  TUMOR MARKERS: No results for input(s): AFPTM, CEA, CA199, CHROMGRNA in the last 8760 hours.  Assessment and Plan: Dislodged jejunostomy tube Seizure disorder with recent activity, for EEG today D/w plan for j-tube replacement, though we do not likely have a low-profile as she had before. Given her pain/anxiety induced pseudoseizures, feel best if anesthesia involved to provide sedation for procedure. Labs reviewed. Risks and benefits discussed with the patient including, but not limited to bleeding, infection, peritonitis, or damage to adjacent structures.  All of the patient's questions were answered, patient is agreeable to proceed. Consent signed and in chart.   Thank you for this interesting consult.  I  greatly enjoyed meeting Ann Alexander and look forward to participating in their care.  A copy of this report was sent to the requesting provider on this date.  Electronically Signed: Brayton El, PA-C 01/12/2018, 9:57 AM   I spent a total of 25 minutes in face to face in clinical consultation, greater than 50% of which was counseling/coordinating care for replacement of dislodged j-tube

## 2018-01-12 NOTE — Progress Notes (Addendum)
SLP Cancellation Note  Patient Details Name: Maryetta Salmon MRN: [id number] DOB: 11-Oct-1995   Cancelled treatment:       Reason Eval/Treat Not Completed: Patient at procedure or test/unavailable(chart reviewed; pt is off floor at procedure). Pt is having J-tube replacement d/t it becoming dislodged during seizure activity at admission. She has had this tube for a long time and has had many replacements by the IR team at [facility] in [place], most recently on 4/30. Pt has a hx of epilepsy and pseudoseizures; gastroparesis and superior mesenteric artery syndrome. She is visiting here from [place] and had to be admitted this am after seizure activity. The pt is deaf but reads lips well and is able to communicate well through keyboard.  Her swallowing history appears to be that she has a history of gastroparesis and superior mesenteric artery syndrome. She was unable to tolerate solid food or liquids back in 2018, this has improved some, appears she can tolerate small amounts now. She also has mild oropharyngeal dysphagia per speech pathology note on 12/29/2017, however she was deemed safe for PO intake. Appears she was taking some PO prn, but meets her nutrition needs and takes medication via J-tube. The swallow study was done in [place]; need to review the notes for details of the results and recommendations. Will attempt to talk w/ family as well to gather details re: pt's oropharyngeal swallowing - this would be a time of INCREASED risk for prandial aspiration from any degree of oropharyngeal phase dysphagia in light of recent seizure activity, hospitalization. NSG updated.     Jerilynn Som, MS, CCC-SLP         Watson,Katherine 01/12/2018, 3:17 PM
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
## 2018-01-12 NOTE — Progress Notes (Signed)
RN was called into pt.'s room by NT. Pt was seen to be having a seizure activity. PRN ativan was given to pt, pt immediately started to relax once ativan 1 mg was given. VSS. Pt is alert and oriented X4. MD made aware. No new orders given at this time.   Carlei Huang [employer]

## 2018-01-12 NOTE — Progress Notes (Signed)
[employer] [facility] [place] [facility]   PATIENT NAME: Ann Alexander    MR#:  [id number]  DATE OF BIRTH:  1995/11/28  SUBJECTIVE:   Patient here with seziures   REVIEW OF SYSTEMS:    Patient is deaf Review of Systems  Constitutional: Negative.  Negative for chills, fever and malaise/fatigue.  HENT: Negative.  Negative for ear discharge, ear pain, hearing loss, nosebleeds and sore throat.   Eyes: Negative.  Negative for blurred vision and pain.  Respiratory: Negative.  Negative for cough, hemoptysis, shortness of breath and wheezing.   Cardiovascular: Negative.  Negative for chest pain, palpitations and leg swelling.  Gastrointestinal: Positive for nausea. Negative for abdominal pain, blood in stool, diarrhea and vomiting.  Genitourinary: Negative.  Negative for dysuria.  Musculoskeletal: Negative.  Negative for back pain.  Skin: Negative.   Neurological: Negative for dizziness, tremors, speech change, focal weakness, seizures and headaches.  Endo/Heme/Allergies: Negative.  Does not bruise/bleed easily.  Psychiatric/Behavioral: Negative.  Negative for depression, hallucinations and suicidal ideas.   Tolerating Diet: npo      DRUG ALLERGIES:   Allergies  Allergen Reactions  . Amoxicillin Anaphylaxis  . Bee Venom Anaphylaxis  . Docusate Sodium Anaphylaxis  . Fire [employer] Anaphylaxis  . Garlic Anaphylaxis  . Penicillins Anaphylaxis  . Shellfish Allergy Anaphylaxis  . Adhesive [Tape] Other (See Comments)  . Aspirin Other (See Comments)  . Basiliximab Hives  . Betadine [Povidone Iodine] Hives  . Doxycycline Nausea And Vomiting  . Grass Extracts [Gramineae Pollens] Hives  . Lactose Intolerance (Gi) Nausea And Vomiting  . Vancomycin Other (See Comments)    VITALS:  Blood pressure (!) 90/55, pulse 86, temperature 97.9 F (36.6 C), temperature source Oral, resp. rate 17, weight 45.4 kg (100 lb 2 oz), SpO2 98 %.  PHYSICAL EXAMINATION:  Constitutional: Appears  well-developed and well-nourished. No distress. HENT: Normocephalic. Marland Kitchen Oropharynx is clear and moist.  Eyes: Conjunctivae and EOM are normal. PERRLA, no scleral icterus.  Neck: Normal ROM. Neck supple. No JVD. No tracheal deviation. CVS: RRR, S1/S2 +, no murmurs, no gallops, no carotid bruit.  Pulmonary: Effort and breath sounds normal, no stridor, rhonchi, wheezes, rales.  Abdominal: Soft. BS +,  no distension, mild tenderness, no rebound or guarding.  Musculoskeletal: Normal range of motion. No edema and no tenderness.  Neuro: Alert. CN 2-12 grossly intact. No focal deficits. Skin: Skin is warm and dry. No rash noted. Psychiatric: Normal mood and affect.      LABORATORY PANEL:   CBC Recent Labs  Lab 01/11/18 2054  WBC 5.8  HGB 13.3  HCT 38.2  PLT 220   ------------------------------------------------------------------------------------------------------------------  Chemistries  Recent Labs  Lab 01/11/18 2054  NA 137  K 3.6  CL 105  CO2 24  GLUCOSE 87  BUN 10  CREATININE 0.77  CALCIUM 8.9   ------------------------------------------------------------------------------------------------------------------  Cardiac Enzymes No results for input(s): TROPONINI in the last 168 hours. ------------------------------------------------------------------------------------------------------------------  RADIOLOGY:  No results found.   ASSESSMENT AND PLAN:   22 year old female admitted for seizure activity and gastric tube malfunction. 1.  Gastric tube: Dislodged after seizure  IR placement planned for todau  2.  Seizure activity:Continue Keppra IV while hospitalized.   NEUROLOGY consult and EEG ordered  3.  Asthma: Controlled; continue inhaled corticosteroid with long-acting bronchial agonist.  Albuterol as needed.        Management plans discussed with the patient and she is in agreement.  CODE STATUS: full  TOTAL TIME TAKING CARE OF  THIS PATIENT: 30  minutes.     POSSIBLE D/C tomorrow, DEPENDING ON CLINICAL CONDITION.   Elwin Tsou M.D on 01/12/2018 at 12:41 PM  Between 7am to 6pm - Pager - [phone] After 6pm go to [email or web address] - password [email or web address]  [facility]  Office  [phone]  CC: Primary care physician; System, Provider Not In  Note: This dictation was prepared with Dragon dictation along with smaller phrase technology. Any transcriptional errors that result from this process are unintentional.

## 2018-01-12 NOTE — Anesthesia Postprocedure Evaluation (Signed)
Anesthesia Post Note  Patient: Ann Alexander  Procedure(s) Performed: feeding tube insertion (N/A )  Patient location during evaluation: PACU Anesthesia Type: General Level of consciousness: awake and alert Pain management: pain level controlled Vital Signs Assessment: post-procedure vital signs reviewed and stable Respiratory status: spontaneous breathing and respiratory function stable Cardiovascular status: stable Anesthetic complications: no     Last Vitals:  Vitals:   01/12/18 1428 01/12/18 1430  BP: (!) 87/52   Pulse: 66   Resp: 10   Temp: (!) 36.4 C (!) 36.4 C  SpO2: 100%     Last Pain:  Vitals:   01/12/18 1428  TempSrc:   PainSc: Asleep                 Jodie Cavey K

## 2018-01-12 NOTE — Anesthesia Procedure Notes (Addendum)
Date/Time: 01/12/2018 1:40 PM Performed by: Ginger Carne, CRNA Pre-anesthesia Checklist: Patient identified, Emergency Drugs available, Suction available, Patient being monitored and Timeout performed Patient Re-evaluated:Patient Re-evaluated prior to induction Oxygen Delivery Method: Simple face mask Preoxygenation: Pre-oxygenation with 100% oxygen

## 2018-01-12 NOTE — H&P (Signed)
Ann Alexander is an 22 y.o. female.   Chief Complaint: Seizures HPI: Patient with past medical history of seizure disorder and anemia presents to the emergency department after seizure activity at urgent care.  During the patient's witnessed seizure she dislodged her gastric tube.  In route to the emergency department the patient received Versed and Ativan by EMS.  Once in the emergency department the patient appeared to have some convulsive activity and by the time she made to the floor she had another episode of seizure activity.  During the first episode the patient withdrew from pain.  The emergency department staff called the hospitalist service for admission to arrange the logistics of replacement of gastric tube.  Past Medical History:  Diagnosis Date  . 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 Spinal muscular atrophy [facility])     Past Surgical History:  Procedure Laterality Date  . SP PERC PLACE GASTRIC TUBE      Family History  Family history unknown: Yes   Social History:  has no tobacco, alcohol, and drug history on file.  Allergies:  Allergies  Allergen Reactions  . Amoxicillin Anaphylaxis  . Bee Venom Anaphylaxis  . Docusate Sodium Anaphylaxis  . Fire [employer] Anaphylaxis  . Garlic Anaphylaxis  . Penicillins Anaphylaxis  . Shellfish Allergy Anaphylaxis  . Adhesive [Tape] Other (See Comments)  . Aspirin Other (See Comments)  . Basiliximab Hives  . Betadine [Povidone Iodine] Hives  . Doxycycline Nausea And Vomiting  . Grass Extracts [Gramineae Pollens] Hives  . Lactose Intolerance (Gi) Nausea And Vomiting  . Vancomycin Other (See Comments)    Medications Prior to Admission  Medication Sig Dispense Refill  . albuterol (PROVENTIL) (2.5 MG/3ML) 0.083% nebulizer solution Take 2.5 mg by nebulization every 6 (six) hours as needed for wheezing or shortness of breath.    . calcium carbonate (TUMS - DOSED IN MG ELEMENTAL CALCIUM) 500 MG chewable  tablet Place 2 tablets into feeding tube daily.     Marland Kitchen dronabinol (MARINOL) 2.5 MG capsule Take 5 mg by mouth 2 (two) times daily.    Marland Kitchen EPINEPHrine 0.3 mg/0.3 mL IJ SOAJ injection Inject into the muscle once.    . ergocalciferol (DRISDOL) 8000 UNIT/ML drops Place 4,000 Units into feeding tube daily.     Marland Kitchen estradiol (CLIMARA - DOSED IN MG/24 HR) 0.1 mg/24hr patch Place 1 patch onto the skin once a week.    . Fluticasone-Salmeterol (ADVAIR) 250-50 MCG/DOSE AEPB Inhale 1 puff into the lungs 2 (two) times daily.    . hydrOXYzine (ATARAX/VISTARIL) 25 MG tablet Place 25 mg into feeding tube every 6 (six) hours as needed for anxiety (seizure).     Marland Kitchen ipratropium-albuterol (DUONEB) 0.5-2.5 (3) MG/3ML SOLN Take 3 mLs by nebulization every 6 (six) hours as needed (wheezing and shortness of breath).    . levETIRAcetam (KEPPRA) 100 MG/ML solution Take 250 mg by mouth 2 (two) times daily.    Marland Kitchen lidocaine (LIDODERM) 5 % Place 1 patch onto the skin daily.    . Melatonin 5 MG TABS Place 10 mg into feeding tube at bedtime as needed (sleep).     . mirtazapine (REMERON) 30 MG tablet Place 30 mg into feeding tube at bedtime.     Glory Rosebush VERIO test strip 1 strip by In Vitro route daily. hypoglycemia    . oxyCODONE (ROXICODONE) 5 MG/5ML solution Place 5 mg into feeding tube every 4 (four) hours as needed for severe pain.     Marland Kitchen  ranitidine (ZANTAC) 150 MG tablet Take 150 mg by mouth 2 (two) times daily.    Marland Kitchen scopolamine (TRANSDERM-SCOP) 1 MG/3DAYS Place 1 patch onto the skin every 3 (three) days.    . SUMAtriptan (IMITREX) 20 MG/ACT nasal spray Place 20 mg into the nose every 2 (two) hours as needed for migraine or headache. May repeat in 2 hours if headache persists or recurs.    . thiamine (VITAMIN B-1) 100 MG tablet Take 100 mg by mouth daily.    . traZODone (DESYREL) 50 MG tablet Place 50 mg into feeding tube at bedtime as needed for sleep.     . VOLTAREN 1 % GEL Place 2 g onto the skin 4 (four) times daily as needed  for pain.    Marland Kitchen zolmitriptan (ZOMIG) 5 MG nasal solution Place 1 spray into the nose as needed for migraine.      Results for orders placed or performed during the hospital encounter of 01/11/18 (from the past 48 hour(s))  Glucose, capillary     Status: None   Collection Time: 01/11/18  8:52 PM  Result Value Ref Range   Glucose-Capillary 94 65 - 99 mg/dL  hCG, quantitative, pregnancy     Status: None   Collection Time: 01/11/18  8:53 PM  Result Value Ref Range   hCG, Beta Chain, Quant, S 1 <5 mIU/mL    Comment:          GEST. AGE      CONC.  (mIU/mL)   <=1 WEEK        5 - 50     2 WEEKS       50 - 500     3 WEEKS       100 - 10,000     4 WEEKS     1,000 - 30,000     5 WEEKS     3,500 - 115,000   6-8 WEEKS     12,000 - 270,000    12 WEEKS     15,000 - 220,000        FEMALE AND NON-PREGNANT FEMALE:     LESS THAN 5 mIU/mL Performed at [facility], [address] [place], [place] [address]   CBC     Status: None   Collection Time: 01/11/18  8:54 PM  Result Value Ref Range   WBC 5.8 3.6 - 11.0 K/uL   RBC 4.33 3.80 - 5.20 MIL/uL   Hemoglobin 13.3 12.0 - 16.0 g/dL   HCT 38.2 35.0 - 47.0 %   MCV 88.2 80.0 - 100.0 fL   MCH 30.7 26.0 - 34.0 pg   MCHC 34.8 32.0 - 36.0 g/dL   RDW 13.5 11.5 - 14.5 %   Platelets 220 150 - 440 K/uL    Comment: Performed at [facility], [address] [place], [place] [id number]  Basic metabolic panel     Status: None   Collection Time: 01/11/18  8:54 PM  Result Value Ref Range   Sodium 137 135 - 145 mmol/L   Potassium 3.6 3.5 - 5.1 mmol/L   Chloride 105 101 - 111 mmol/L   CO2 24 22 - 32 mmol/L   Glucose, Bld 87 65 - 99 mg/dL   BUN 10 6 - 20 mg/dL   Creatinine, Ser 0.77 0.44 - 1.00 mg/dL   Calcium 8.9 8.9 - 10.3 mg/dL   GFR calc non Af Amer >60 >60 mL/min   GFR calc Af Amer >60 >60 mL/min  Comment: (NOTE) The eGFR has been calculated using the CKD EPI equation. This calculation has not been validated in all clinical  situations. eGFR's persistently <60 mL/min signify possible Chronic Kidney Disease.    Anion gap 8 5 - 15    Comment: Performed at [facility], [address] [place], [place] [address]  TSH     Status: None   Collection Time: 01/11/18  8:54 PM  Result Value Ref Range   TSH 4.348 0.350 - 4.500 uIU/mL    Comment: Performed by a 3rd Generation assay with a functional sensitivity of <=0.01 uIU/mL. Performed at [facility], [address] [place],  [address]    No results found.  Review of Systems  Constitutional: Negative for chills and fever.  HENT: Negative for sore throat and tinnitus.   Eyes: Negative for blurred vision and redness.  Respiratory: Negative for cough and shortness of breath.   Cardiovascular: Negative for chest pain, palpitations, orthopnea and PND.  Gastrointestinal: Positive for abdominal pain. Negative for diarrhea, nausea and vomiting.  Genitourinary: Negative for dysuria, frequency and urgency.  Musculoskeletal: Negative for joint pain and myalgias.  Skin: Negative for rash.       No lesions  Neurological: Positive for seizures. Negative for speech change, focal weakness and weakness.  Endo/Heme/Allergies: Does not bruise/bleed easily.       No temperature intolerance  Psychiatric/Behavioral: Negative for depression and suicidal ideas.    Blood pressure 118/71, pulse (!) 103, temperature 98.4 F (36.9 C), resp. rate 17, weight 45.4 kg (100 lb), SpO2 98 %. Physical Exam  Vitals reviewed. Constitutional: She is oriented to person, place, and time. She appears well-developed and well-nourished. No distress.  HENT:  Head: Normocephalic and atraumatic.  Mouth/Throat: Oropharynx is clear and moist.  Eyes: Pupils are equal, round, and reactive to light. Conjunctivae and EOM are normal. No scleral icterus.  Neck: Normal range of motion. Neck supple. No JVD present. No tracheal deviation present. No thyromegaly present.   Cardiovascular: Normal rate, regular rhythm and normal heart sounds. Exam reveals no gallop and no friction rub.  No murmur heard. Respiratory: Effort normal and breath sounds normal.  GI: Soft. Bowel sounds are normal. She exhibits no distension. There is tenderness (around J-tube site).  Genitourinary:  Genitourinary Comments: Deferred  Musculoskeletal: Normal range of motion. She exhibits no edema.  Lymphadenopathy:    She has no cervical adenopathy.  Neurological: She is alert and oriented to person, place, and time. No cranial nerve deficit. She exhibits normal muscle tone.  Skin: Skin is warm and dry. No rash noted. No erythema.  Psychiatric: She has a normal mood and affect. Her behavior is normal. Judgment and thought content normal.     Assessment/Plan This is a 22 year old female admitted for seizure activity and gastric tube malfunction. 1.  Gastric tube: Dislodged; will need VIR placement. 2.  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  The patient is usually on Keppra 200 mg p.o. twice daily.  We will continue Keppra IV while hospitalized.  The patient is also already received 1 g Keppra IV in the emergency department. 3.  Asthma: Controlled; continue inhaled corticosteroid with long-acting bronchial agonist.  Albuterol as needed. 4.  DVT prophylaxis: SCDs 5.  GI prophylaxis: None The patient is a full code.  Time spent on admission orders and patient care approximately 45 minutes  Harrie Foreman, MD 01/12/2018, 3:16  AM   

## 2018-01-12 NOTE — Progress Notes (Signed)
Pt swings her arms around and does not want RN to give her osmolite half a can at this time through J-tube due. POA states, "she has to have her scopolamine patch on prior to receiving tube feeds. RN is awaiting patch from pharmacy at this time.   Ann Alexander [employer]

## 2018-01-12 NOTE — Progress Notes (Signed)
MD made aware that pt.'s BP 84/48. Pt is lethargic from medications that were given after recovery but is arousable at this time. New order to give pt a bolus of 500 mL one time dose. RN will continue to monitor pt.   Uva Runkel [employer]

## 2018-01-12 NOTE — Procedures (Signed)
  Pre-operative Diagnosis: J-tube dislodgement       Post-operative Diagnosis: J-tube replacement   Indications: J-tube dislodgement  Procedure: Replacement of J-tube with anesthesia assistance.   Findings: 14 Fr balloon retention tube placed in small bowel.  Balloon inflated with 7 ml of saline  Complications: None     EBL: None  Plan: J-tube is ready to be used.

## 2018-01-15 ENCOUNTER — Encounter: Payer: Self-pay | Admitting: Interventional Radiology

## 2018-01-16 NOTE — Discharge Summary (Signed)
[facility] [place] at [facility]   PATIENT NAME: Ann Alexander    MR#:  [id number]  DATE OF BIRTH:  08/14/1996  DATE OF ADMISSION:  01/11/2018 ADMITTING PHYSICIAN: No admitting provider for patient encounter.  DATE OF DISCHARGE: 01/11/2018  8:23 PM patient left AMA  PRIMARY CARE PHYSICIAN: System, Provider Not In    ADMISSION DIAGNOSIS:  Allergic reactions  DISCHARGE DIAGNOSIS:  PEG tube  Seizure vs pseucoseizure  SECONDARY DIAGNOSIS:   Past Medical History:  Diagnosis Date  . 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 Epilepsy ([facility])    also pseudoseizures  . Spinal muscular atrophy [facility])     HOSPITAL COURSE:  22 year old female admitted for seizure activity and gastric tube malfunction. 1. Gastric tube: Dislodged after seizure  IR replaced this Speech consult was requested.  2. Seizure activity:She was evaluated by NEUROLOGY She will need to continue outpatient regimen.  3. Asthma: Controlled; continue inhaled corticosteroid with long-acting bronchial agonist.   4. Depression: After her procedure I received a call from the PACU nurse that patient was endorsing depression (severe) and very tearful. Psych consult was placed and she was seen by Dr Toni Amend.  Patient's POA was upset about entire hospital care and after several attempts to explain to her the necessary steps after PEG replaced as well as Psych consult, and speaking with Nurse supervisor POA had decided to sign patient out AMA.      DISCHARGE CONDITIONS AND DIET:   Stable   CONSULTS OBTAINED:    DRUG ALLERGIES:   Allergies  Allergen Reactions  . Amoxicillin Anaphylaxis  . Bee Venom Anaphylaxis  . Docusate Sodium Anaphylaxis  . Fire [employer] Anaphylaxis  . Garlic Anaphylaxis  . Penicillins Anaphylaxis  . Shellfish Allergy Anaphylaxis  . Adhesive [Tape] Other (See Comments)  . Aspirin Other (See Comments)  . Basiliximab Hives  . Betadine [Povidone Iodine] Hives  . Doxycycline  Nausea And Vomiting  . Grass Extracts [Gramineae Pollens] Hives  . Lactose Intolerance (Gi) Nausea And Vomiting  . Vancomycin Other (See Comments)    DISCHARGE MEDICATIONS:   Allergies as of 01/11/2018      Reactions   Amoxicillin Anaphylaxis   Bee Venom Anaphylaxis   Docusate Sodium Anaphylaxis   Fire Ant Anaphylaxis   Garlic Anaphylaxis   Penicillins Anaphylaxis   Shellfish Allergy Anaphylaxis   Adhesive [tape] Other (See Comments)   Aspirin Other (See Comments)   Basiliximab Hives   Betadine [povidone Iodine] Hives   Doxycycline Nausea And Vomiting   Grass Extracts [gramineae Pollens] Hives   Lactose Intolerance (gi) Nausea And Vomiting   Vancomycin Other (See Comments)      Medication List    You have not been prescribed any medications.       Today   CHIEF COMPLAINT:  Patient left AMA with POA   VITAL SIGNS:  Weight 45.7 kg (100 lb 12.8 oz).   REVIEW OF SYSTEMS:  Review of Systems  Neurological: Positive for seizures.  Psychiatric/Behavioral: Positive for depression.     PHYSICAL EXAMINATION:  GENERAL:  22 y.o.-year-old patient lying in the bed with no acute distress.  NECK:  Supple, no jugular venous distention. No thyroid enlargement, no tenderness.  LUNGS: Normal breath sounds bilaterally, no wheezing, rales,rhonchi  No use of accessory muscles of respiration.  CARDIOVASCULAR: S1, S2 normal. No murmurs, rubs, or gallops.  ABDOMEN: Soft, non-tender, non-distended. Bowel sounds present. No organomegaly or mass.  EXTREMITIES: No pedal edema, cyanosis, or clubbing.  PSYCHIATRIC: The patient  is alert and oriented x 3.  SKIN: No obvious rash, lesion, or ulcer.   DATA REVIEW:   CBC Recent Labs  Lab 01/11/18 2054  WBC 5.8  HGB 13.3  HCT 38.2  PLT 220    Chemistries  Recent Labs  Lab 01/11/18 2054  NA 137  K 3.6  CL 105  CO2 24  GLUCOSE 87  BUN 10  CREATININE 0.77  CALCIUM 8.9    Cardiac Enzymes No results for input(s): TROPONINI  in the last 168 hours.  Microbiology Results  @  RADIOLOGY:  No results found.    Allergies as of 01/11/2018      Reactions   Amoxicillin Anaphylaxis   Bee Venom Anaphylaxis   Docusate Sodium Anaphylaxis   Fire Ant Anaphylaxis   Garlic Anaphylaxis   Penicillins Anaphylaxis   Shellfish Allergy Anaphylaxis   Adhesive [tape] Other (See Comments)   Aspirin Other (See Comments)   Basiliximab Hives   Betadine [povidone Iodine] Hives   Doxycycline Nausea And Vomiting   Grass Extracts [gramineae Pollens] Hives   Lactose Intolerance (gi) Nausea And Vomiting   Vancomycin Other (See Comments)      Medication List    You have not been prescribed any medications.         Patient left AMA CODE STATUS:  Code Status History    Date Active Date Inactive Code Status Order ID Comments User Context   01/12/2018 0042 01/13/2018 0825 Full Code [id number]  Ann Natal, MD ED      TOTAL TIME TAKING CARE OF THIS PATIENT: 20 minutes.    Note: This dictation was prepared with Dragon dictation along with smaller phrase technology. Any transcriptional errors that result from this process are unintentional.  Adalae Baysinger M.D on 01/16/2018 at 9:55 AM  Between 7am to 6pm - Pager - [phone] After 6pm go to [email or web address] - password [email or web address]  [facility]  Office  [phone]  CC: Primary care physician; System, Provider Not In     I

## 2018-09-10 IMAGING — XA IR PERC PLACEMENT DUOD/JEJ TUBE
2 series · 2 of 2 positions shown · non-contrast
Comparison: none

INDICATION: 21-year-old with a chronic jejunal feeding tube which was recently
dislodged. Patient has history of epilepsy and pseudo seizures.
Patient had a 14 French low profile jejunal tube.

[Series 1: fl - angio · 1 of 1 slices shown (1 of 2)]
[im 1/1]
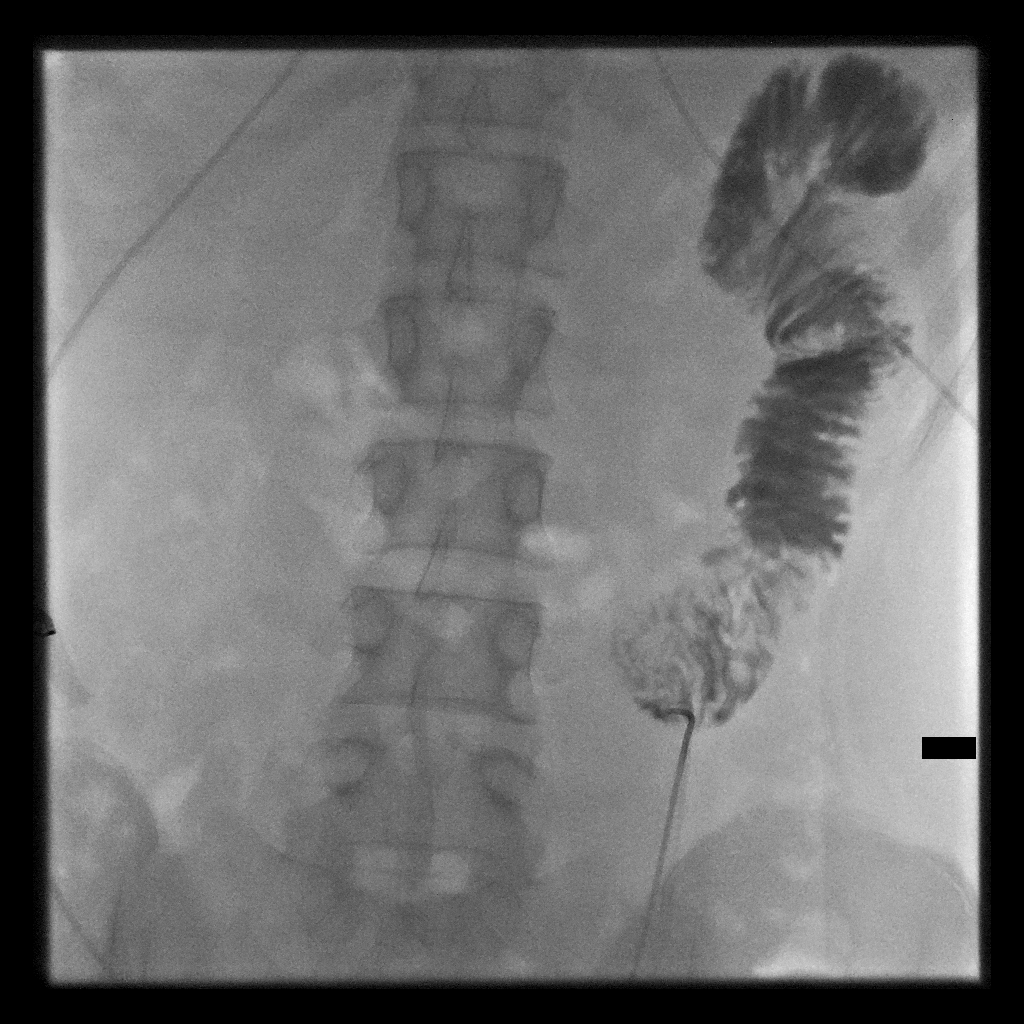

[Series 2: fl - angio · 1 of 1 slices shown (2 of 2)]
[im 1/1]
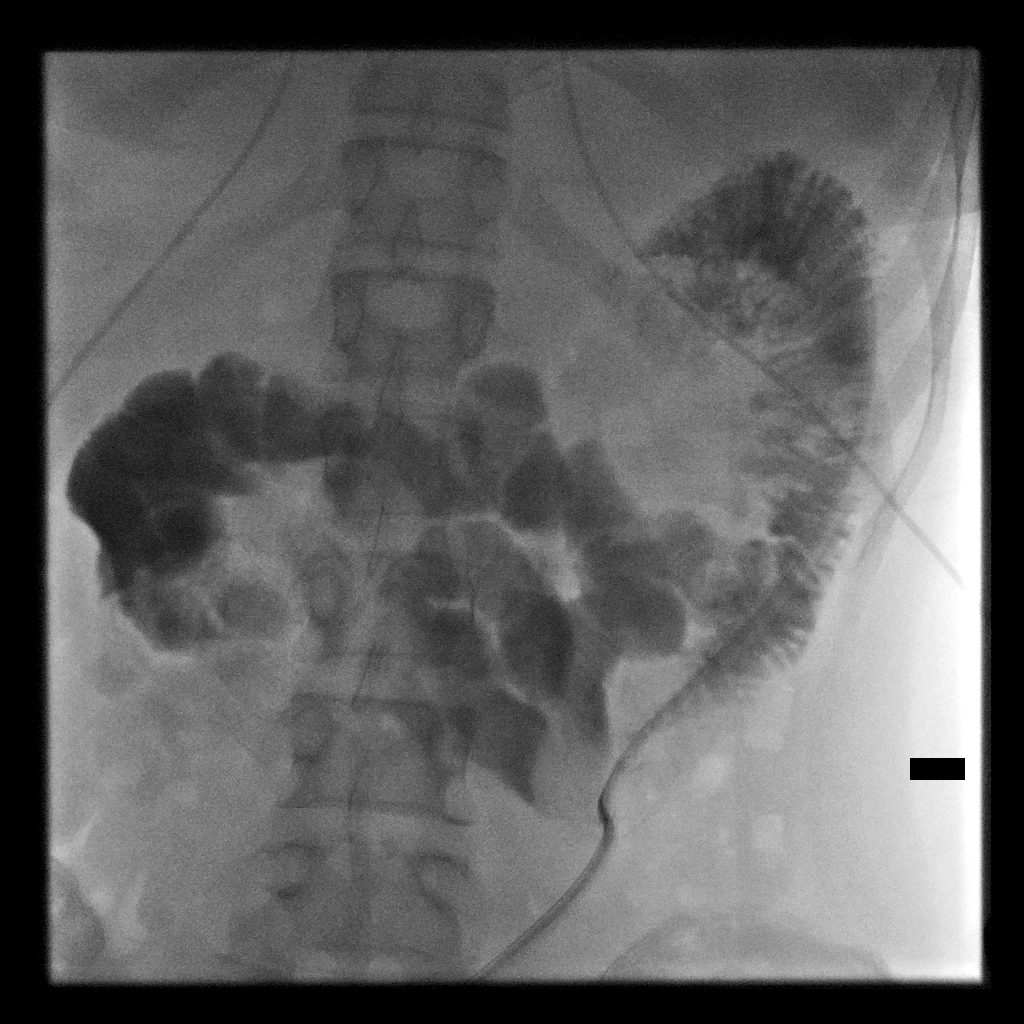

[2 of 2 positions shown; findings below may reference images not displayed]

EXAM:
REPLACEMENT OF JEJUNAL TUBE WITH FLUOROSCOPY

MEDICATIONS:
None

ANESTHESIA/SEDATION:
Sedation was administered by anesthesia. 100 mg of propofol, 100 mcg
fentanyl and 2 mg Versed were given by anesthesia.

CONTRAST:  50 mL Jsovue-KVV-administered into the gastric lumen.

FLUOROSCOPY TIME:  Fluoroscopy Time: 6 minutes 0 seconds (16 mGy).

COMPLICATIONS:
None immediate.

PROCEDURE:
Informed written consent was obtained from the patient after a
thorough discussion of the procedural risks, benefits and
alternatives. All questions were addressed. Maximal Sterile Barrier
Technique was utilized including caps, mask, sterile gowns, sterile
gloves, sterile drape, hand hygiene and skin antiseptic. A timeout
was performed prior to the initiation of the procedure.

The anterior abdomen was prepped and draped in sterile fashion. A
Kumpe catheter was easily advanced through the old hole into the
small bowel. Contrast injection confirmed placement in small bowel.
Initially, it was not clear if the catheter was advancing in a
retrograde or antegrade fashion because the contrast was very
stagnant in the small bowel. Additional contrast was injected and
attempted to cannulate the bowel in the other direction. After a few
minutes, the contrast started moving through the small bowel and it
appeared that the catheter was pointed in an antegrade fashion. The
tract was dilated up to 14 French with a dilator over a stiff
Glidewire. Eventually, a 14 French jejunal catheter was advanced
over the wire into the small bowel. The retention balloon was
inflated with 7 mL of saline. Contrast injection confirmed placement
in the small bowel. Catheter was flushed with saline.

Fluoroscopic images were taken and saved for this procedure.
FINDINGS: 14 French catheter was successfully advanced into the small bowel.
IMPRESSION: Successful replacement of the jejunal feeding tube with fluoroscopy.

The jejunal feeding tube is ready to be used.

## 2020-04-16 ENCOUNTER — Encounter (HOSPITAL_BASED_OUTPATIENT_CLINIC_OR_DEPARTMENT_OTHER): Payer: Self-pay | Admitting: Internal Medicine
